# Patient Record
Sex: Male | Born: 1935 | Race: White | Hispanic: No | Marital: Married | Smoking: Never smoker
Health system: Southern US, Community
[De-identification: ages and names within clinical notes are randomized; demographics above are authoritative.]

## PROBLEM LIST (undated history)

## (undated) DIAGNOSIS — N401 Enlarged prostate with lower urinary tract symptoms: Secondary | ICD-10-CM

## (undated) DIAGNOSIS — R739 Hyperglycemia, unspecified: Secondary | ICD-10-CM

## (undated) DIAGNOSIS — M109 Gout, unspecified: Secondary | ICD-10-CM

## (undated) DIAGNOSIS — N400 Enlarged prostate without lower urinary tract symptoms: Secondary | ICD-10-CM

## (undated) DIAGNOSIS — T8579XA Infection and inflammatory reaction due to other internal prosthetic devices, implants and grafts, initial encounter: Secondary | ICD-10-CM

## (undated) DIAGNOSIS — I10 Essential (primary) hypertension: Secondary | ICD-10-CM

## (undated) DIAGNOSIS — N289 Disorder of kidney and ureter, unspecified: Secondary | ICD-10-CM

## (undated) DIAGNOSIS — R351 Nocturia: Secondary | ICD-10-CM

## (undated) DIAGNOSIS — J45909 Unspecified asthma, uncomplicated: Secondary | ICD-10-CM

## (undated) DIAGNOSIS — I4891 Unspecified atrial fibrillation: Secondary | ICD-10-CM

## (undated) DIAGNOSIS — I5022 Chronic systolic (congestive) heart failure: Secondary | ICD-10-CM

## (undated) DIAGNOSIS — J449 Chronic obstructive pulmonary disease, unspecified: Secondary | ICD-10-CM

## (undated) DIAGNOSIS — A4901 Methicillin susceptible Staphylococcus aureus infection, unspecified site: Secondary | ICD-10-CM

## (undated) DIAGNOSIS — N139 Obstructive and reflux uropathy, unspecified: Secondary | ICD-10-CM

## (undated) DIAGNOSIS — E782 Mixed hyperlipidemia: Secondary | ICD-10-CM

## (undated) DIAGNOSIS — L570 Actinic keratosis: Secondary | ICD-10-CM

## (undated) DIAGNOSIS — R600 Localized edema: Secondary | ICD-10-CM

## (undated) DIAGNOSIS — M1990 Unspecified osteoarthritis, unspecified site: Secondary | ICD-10-CM

## (undated) DIAGNOSIS — N138 Other obstructive and reflux uropathy: Secondary | ICD-10-CM

## (undated) DIAGNOSIS — I480 Paroxysmal atrial fibrillation: Secondary | ICD-10-CM

## (undated) DIAGNOSIS — I340 Nonrheumatic mitral (valve) insufficiency: Secondary | ICD-10-CM

## (undated) HISTORY — DX: Chronic obstructive pulmonary disease, unspecified: J44.9

## (undated) HISTORY — DX: Localized edema: R60.0

## (undated) HISTORY — DX: Obstructive and reflux uropathy, unspecified: N13.9

## (undated) HISTORY — DX: Benign prostatic hyperplasia with lower urinary tract symptoms: N40.1

## (undated) HISTORY — DX: Benign prostatic hyperplasia without lower urinary tract symptoms: N40.0

## (undated) HISTORY — DX: Chronic systolic (congestive) heart failure: I50.22

## (undated) HISTORY — DX: Paroxysmal atrial fibrillation: I48.0

## (undated) HISTORY — DX: Essential (primary) hypertension: I10

## (undated) HISTORY — DX: Hyperglycemia, unspecified: R73.9

## (undated) HISTORY — DX: Nonrheumatic mitral (valve) insufficiency: I34.0

## (undated) HISTORY — DX: Infection and inflammatory reaction due to other internal prosthetic devices, implants and grafts, initial encounter: T85.79XA

## (undated) HISTORY — DX: Unspecified osteoarthritis, unspecified site: M19.90

## (undated) HISTORY — DX: Disorder of kidney and ureter, unspecified: N28.9

## (undated) HISTORY — DX: Mixed hyperlipidemia: E78.2

## (undated) HISTORY — DX: Unspecified asthma, uncomplicated: J45.909

## (undated) HISTORY — DX: Other obstructive and reflux uropathy: N13.8

## (undated) HISTORY — DX: Nocturia: R35.1

## (undated) HISTORY — DX: Gout, unspecified: M10.9

## (undated) HISTORY — DX: Actinic keratosis: L57.0

## (undated) HISTORY — DX: Methicillin susceptible Staphylococcus aureus infection, unspecified site: A49.01

---
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
## 1964-09-27 HISTORY — PX: TONSILLECTOMY: SUR1361

## 1983-09-28 HISTORY — PX: TRANSURETHRAL RESECTION OF PROSTATE: SHX73

## 1991-09-28 HISTORY — PX: HERNIA REPAIR: SHX51

## 2004-08-13 ENCOUNTER — Ambulatory Visit: Payer: Self-pay | Admitting: Gastroenterology

## 2004-12-16 ENCOUNTER — Ambulatory Visit: Payer: Self-pay | Admitting: Unknown Physician Specialty

## 2004-12-18 ENCOUNTER — Inpatient Hospital Stay: Payer: Self-pay | Admitting: Internal Medicine

## 2005-03-23 ENCOUNTER — Encounter: Payer: Self-pay | Admitting: Internal Medicine

## 2005-09-27 HISTORY — PX: TOTAL KNEE ARTHROPLASTY: SHX125

## 2006-02-24 IMAGING — US US EXTREM LOW VENOUS*L*
1 series · 17 of 24 positions shown · non-contrast
Comparison: none

REASON FOR EXAM: Swelling and redness, evaluate for DVT in left leg
Call Report [phone], Ext #[phone]
COMMENTS:

[Series 1: us extrem low venous*left* · 17 of 25 slices shown]
[im 1/25]
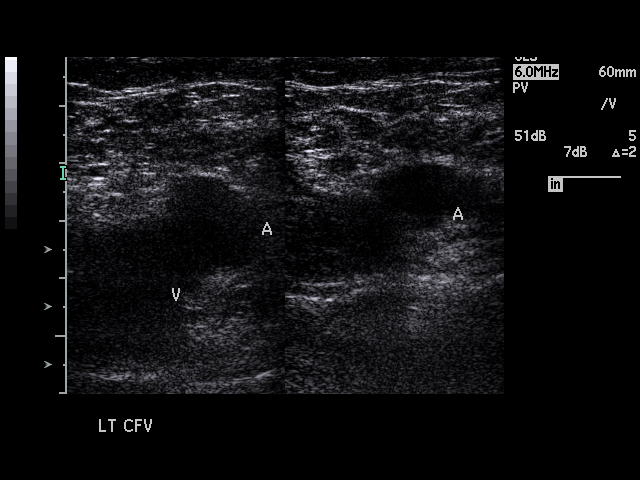
[im 3/25]
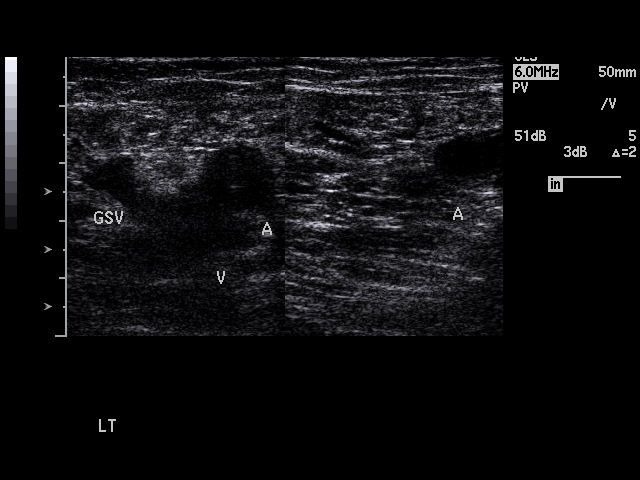
[im 4/25]
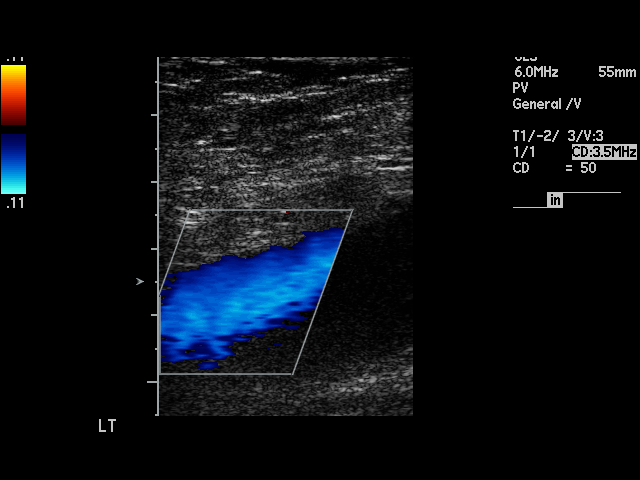
[im 5/25]
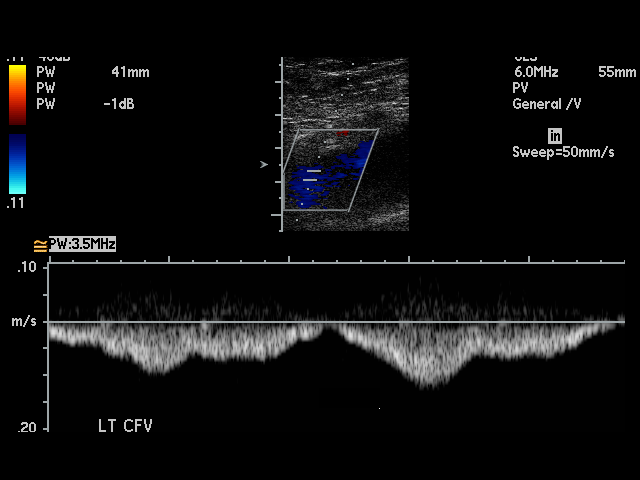
[im 7/25]
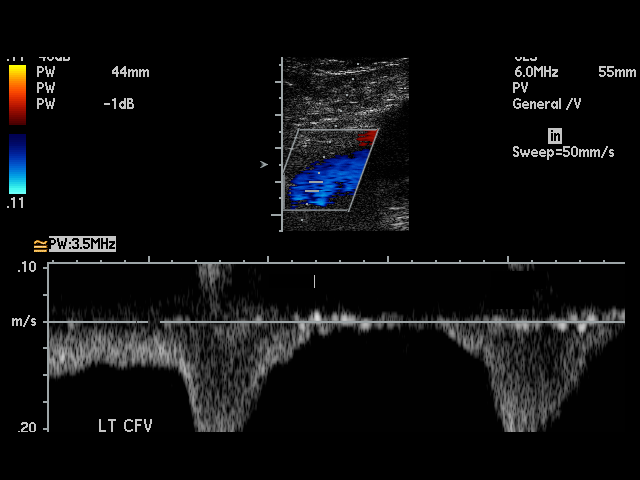
[im 8/25]
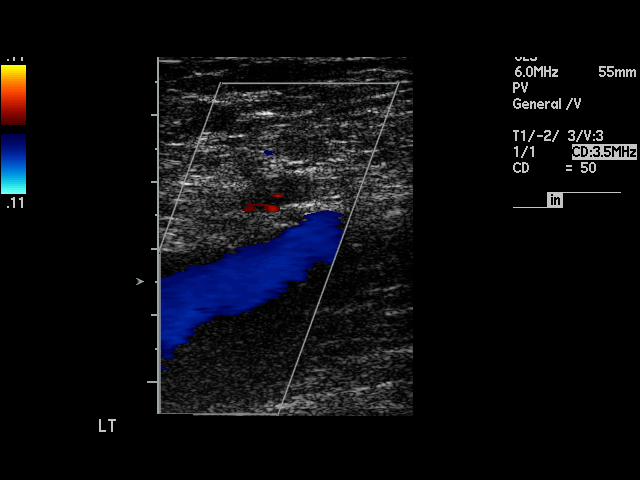
[im 10/25]
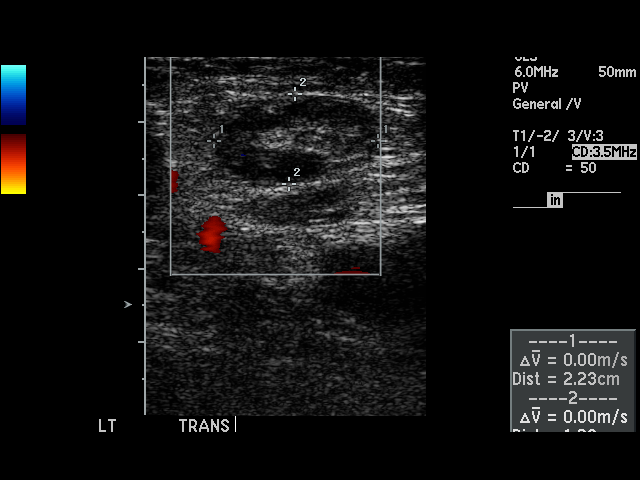
[im 11/25]
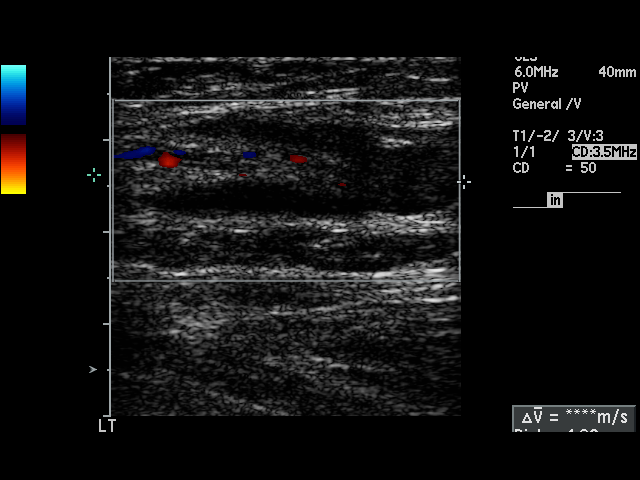
[im 13/25]
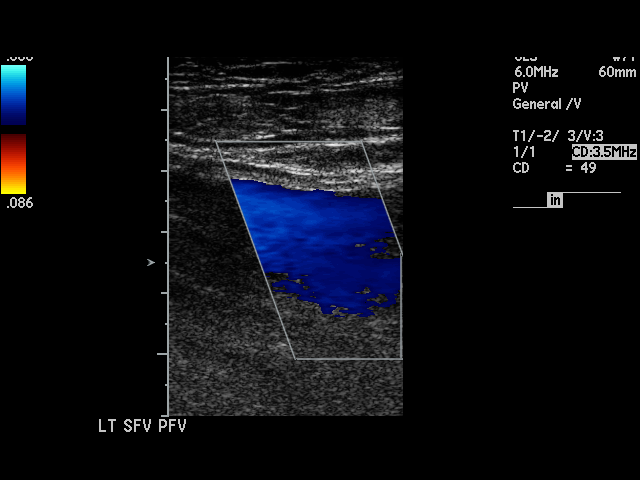
[im 14/25]
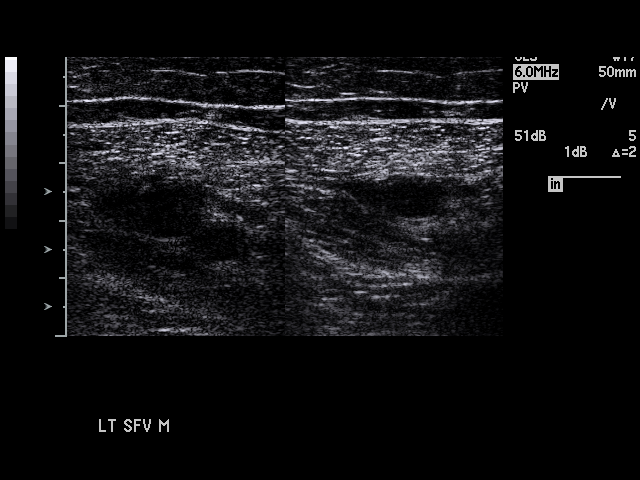
[im 15/25]
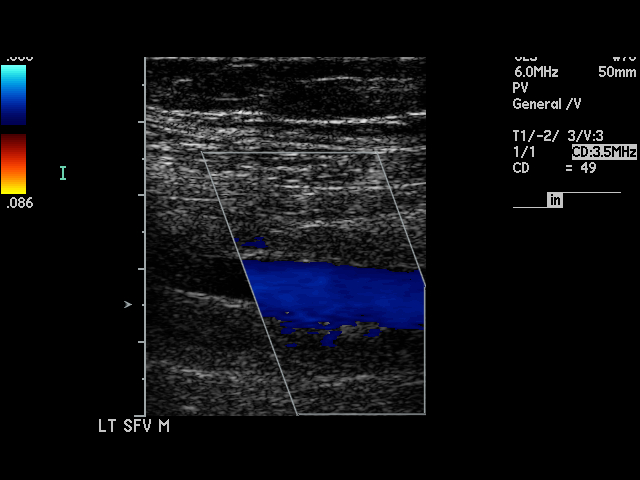
[im 17/25]
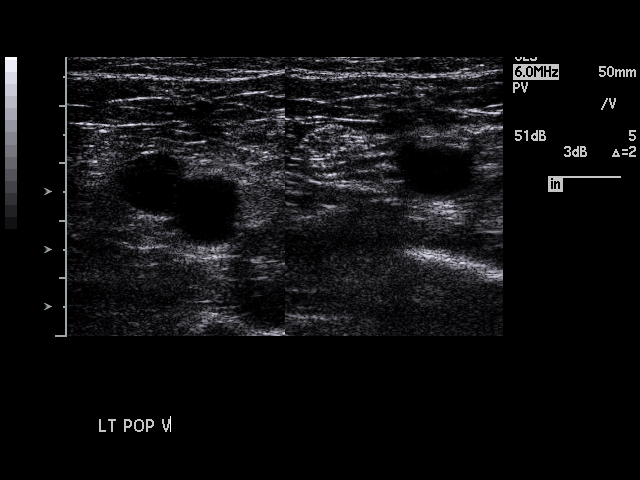
[im 18/25]
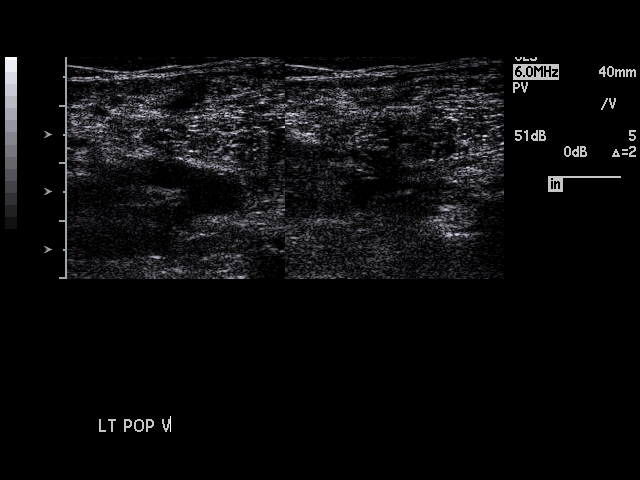
[im 20/25]
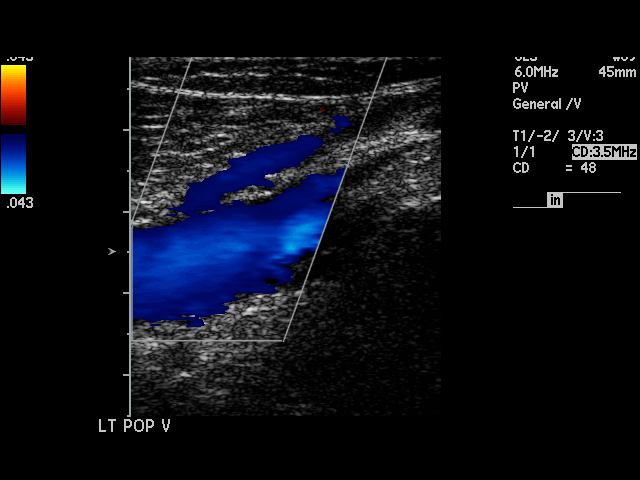
[im 21/25]
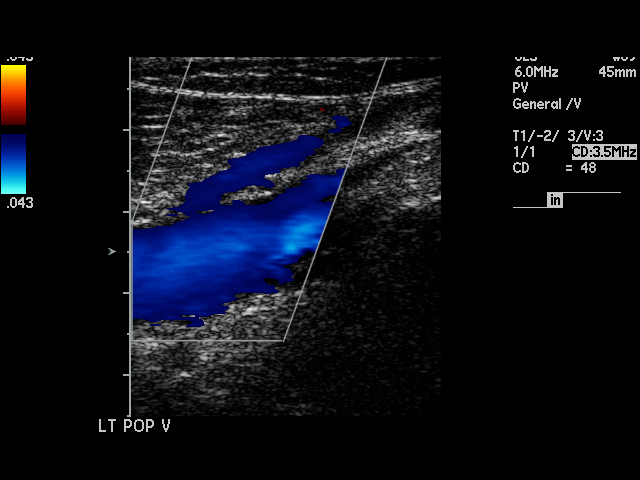
[im 22/25]
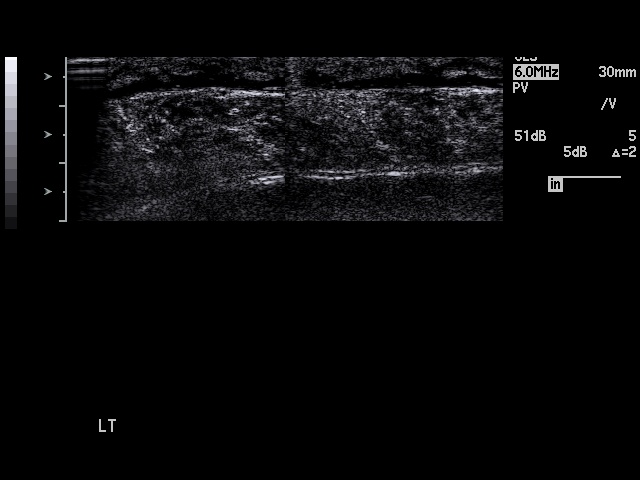
[im 25/25]
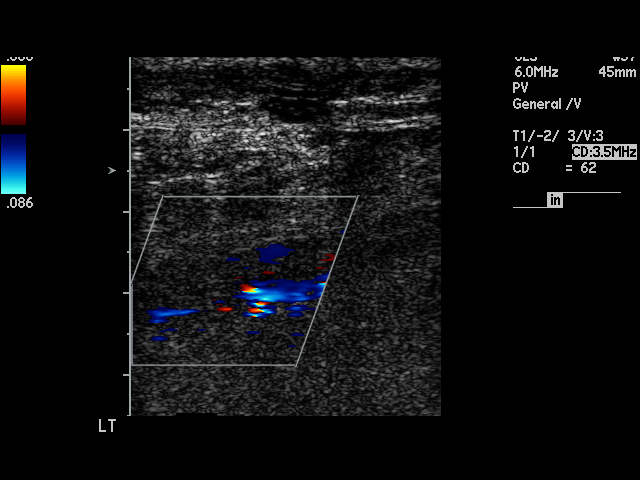

[17 of 24 positions shown; findings below may reference images not displayed]

PROCEDURE:     US  - US DOPPLER LOW EXTR LEFT  - December 16, 2004  [DATE]

RESULT:     LEFT lower extremity deep venous ultrasound and color Doppler
examination is performed.

The phasic, augmentation and Valsalva flow waveforms are normal. The LEFT
femoral and popliteal vein shows normal compressibility. Doppler examination
shows no occlusion.
IMPRESSION: Normal study. No deep venous thrombosis is identified.

## 2006-02-26 IMAGING — US US EXTREM LOW VENOUS*L*
1 series · 17 of 24 positions shown · non-contrast
Comparison: none

REASON FOR EXAM: Pain, cellulitis       please look all the way down to the
calf
COMMENTS:

[Series 1: us extrem low venous*left* · 17 of 34 slices shown]
[im 1/34]
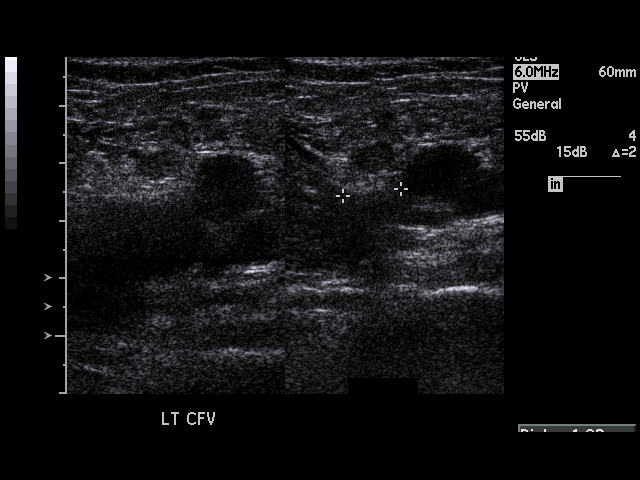
[im 3/34]
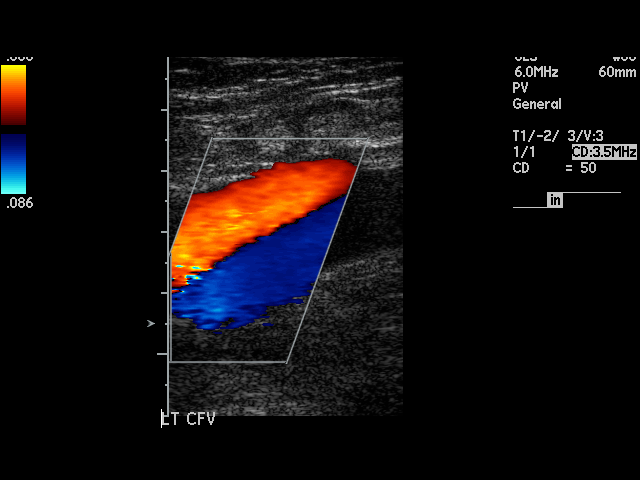
[im 5/34]
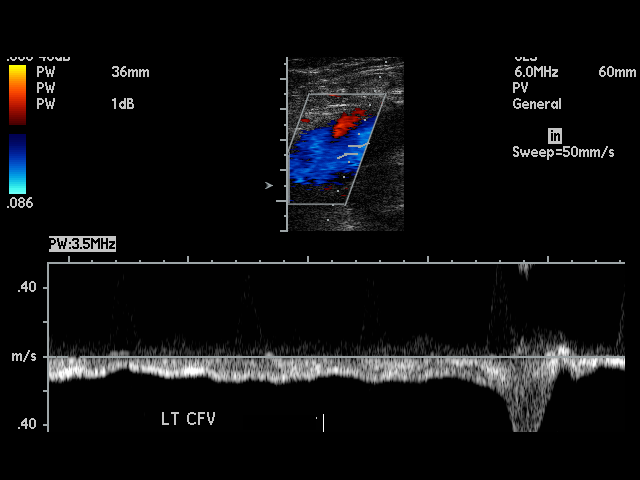
[im 6/34]
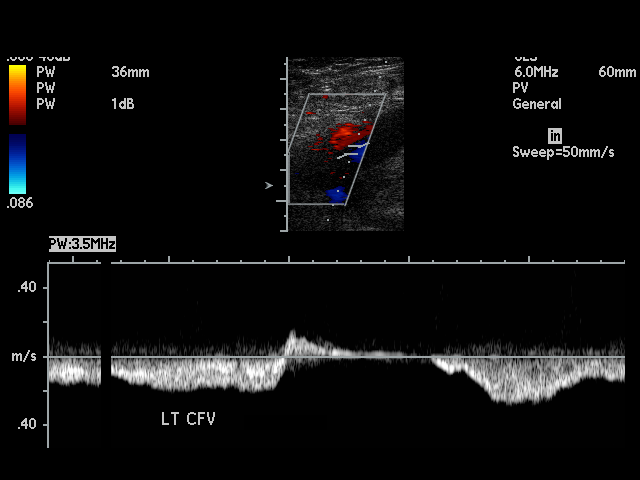
[im 9/34]
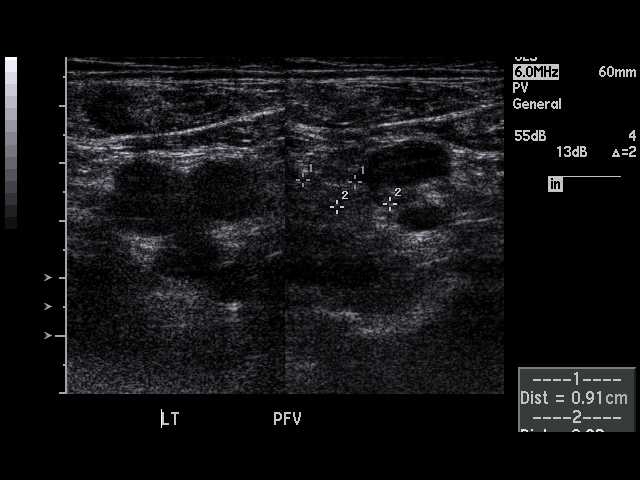
[im 11/34]
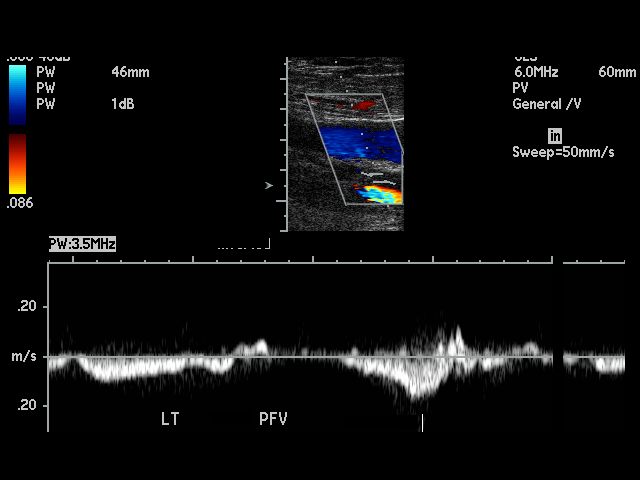
[im 13/34]
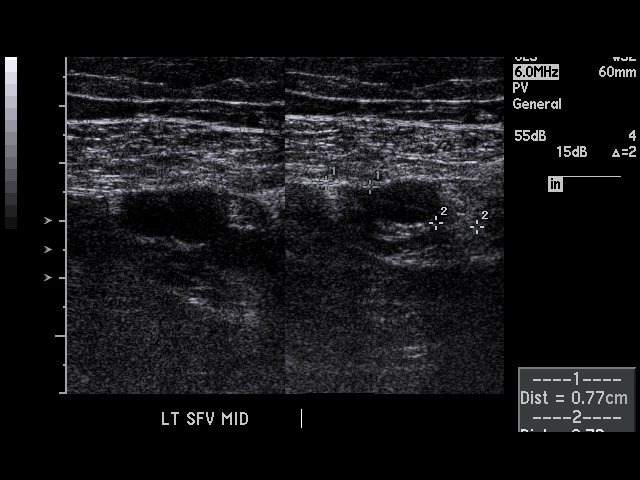
[im 15/34]
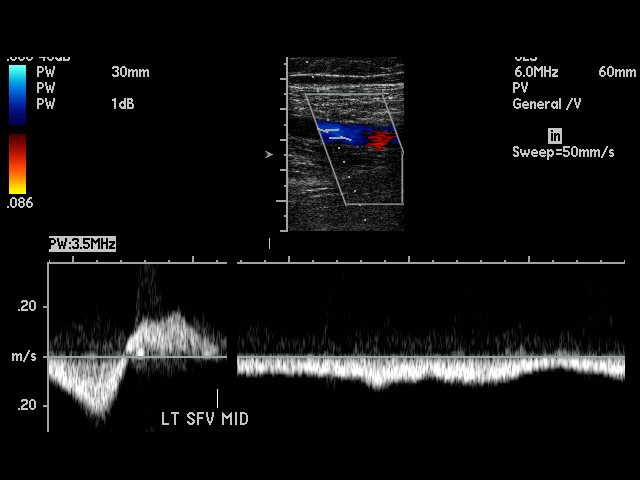
[im 18/34]
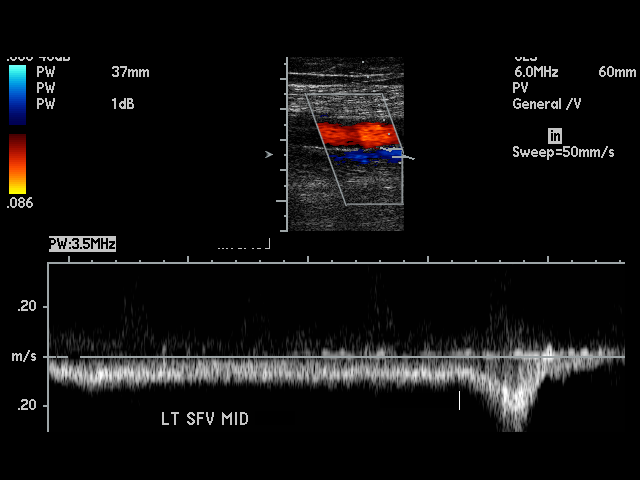
[im 19/34]
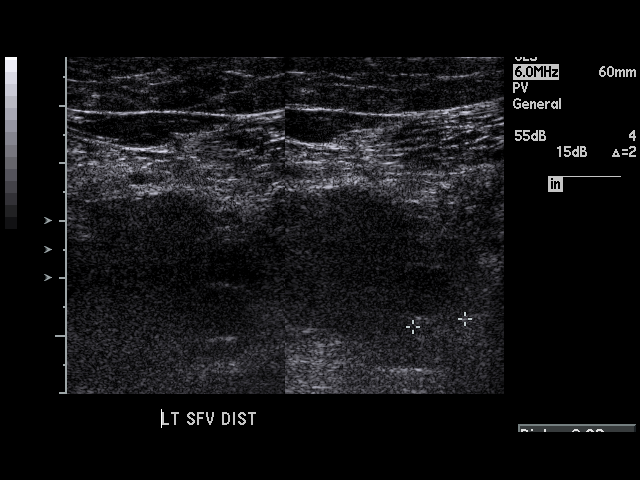
[im 21/34]
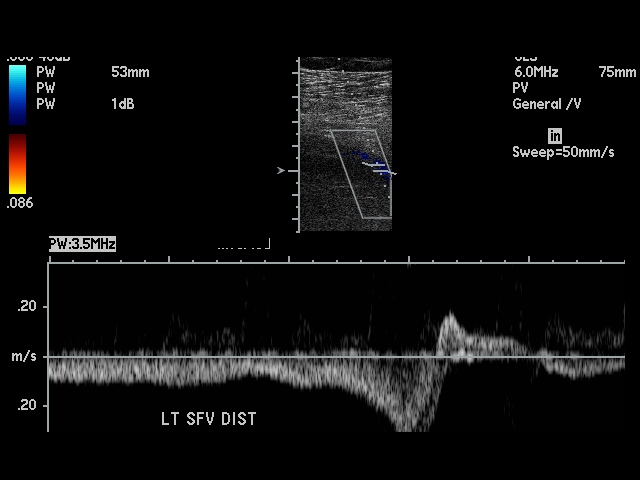
[im 23/34]
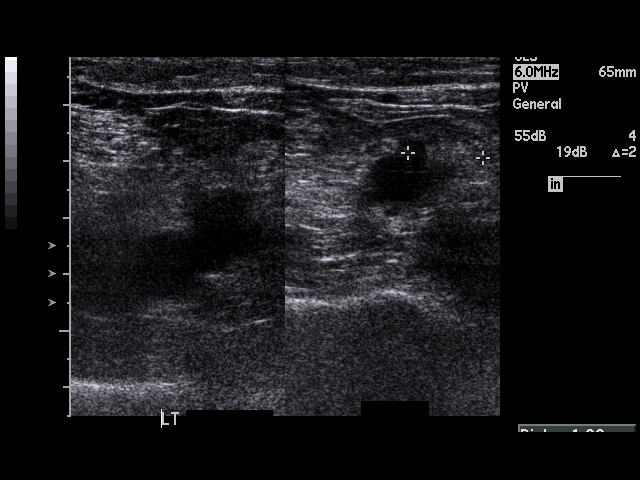
[im 25/34]
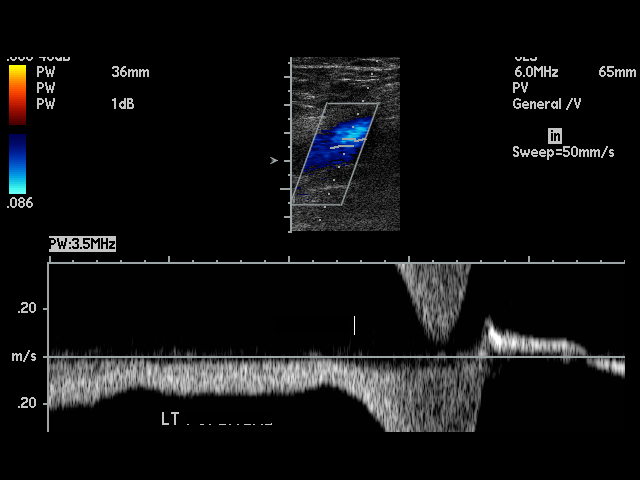
[im 28/34]
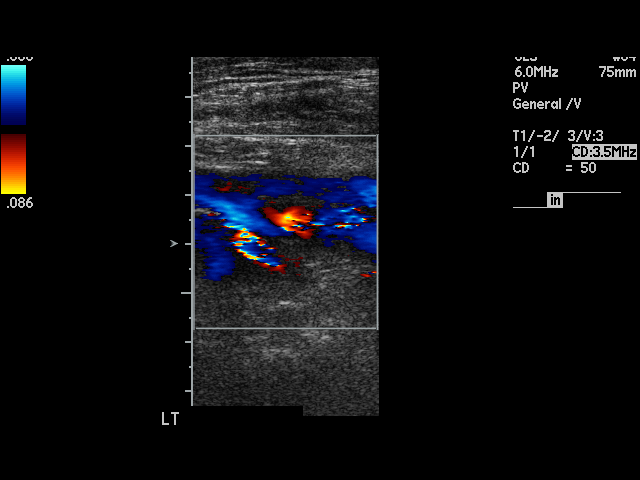
[im 29/34]
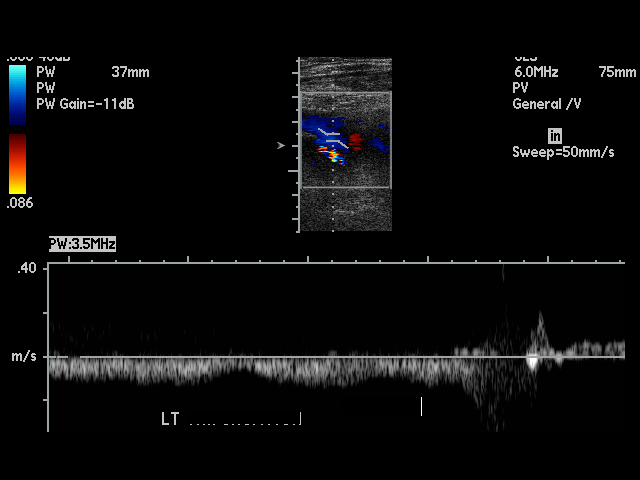
[im 31/34]
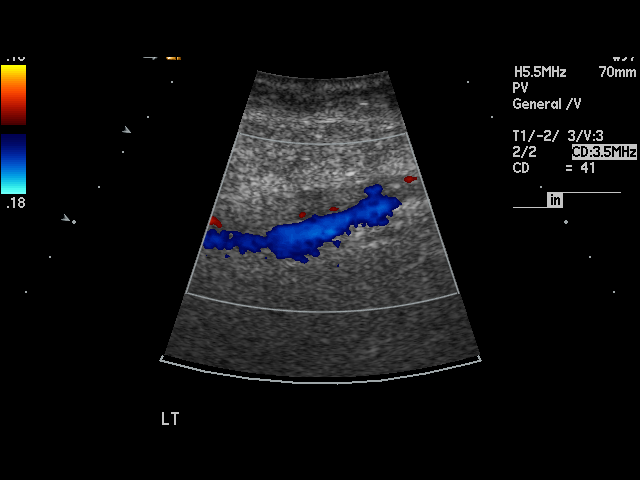
[im 34/34]
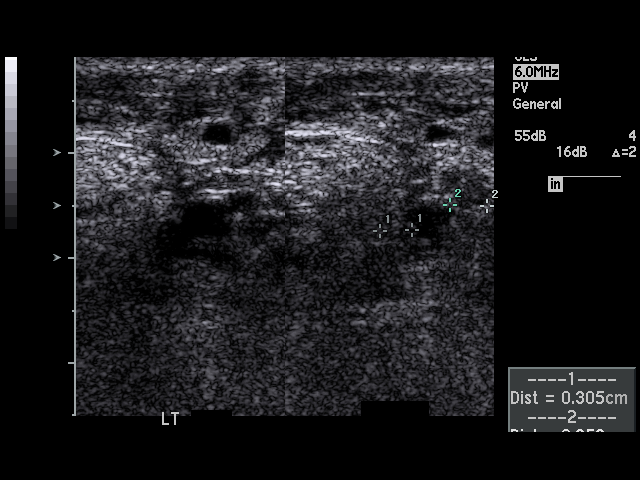

[17 of 24 positions shown; findings below may reference images not displayed]

PROCEDURE:     US  - US DOPPLER LOW EXTR LEFT  - December 18, 2004  [DATE]

RESULT:     The LEFT femoral and popliteal veins are normally compressible.
The waveform patterns are normal and the color flow images are normal.
Imaging was performed to the level of the trifurcation of the deep veins in
the upper calf and no thrombus was seen. The patient has an obviously
swollen and inflamed calf.
IMPRESSION: There is no evidence of thrombus within the LEFT femoral or
popliteal veins or within the visualized portions of the calf vessels.

The findings were called to the Emergency Room at the conclusion of the
study.

## 2006-03-03 IMAGING — NM NM BONE LIMITED
1 series · 10 of 10 positions shown · non-contrast
Comparison: none

REASON FOR EXAM: part 2
COMMENTS:

[Series 1: bone statics · 2.40mm/px · 5 acquisitions, 10 frames shown]
[im 1/5]
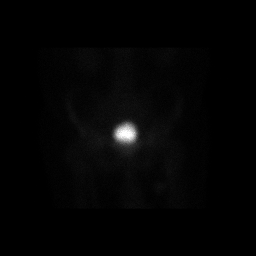
[im 1/5]
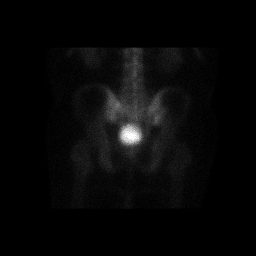
[im 2/5]
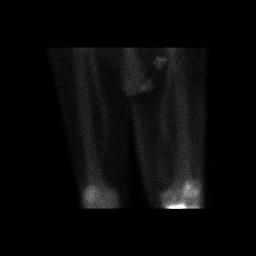
[im 2/5]
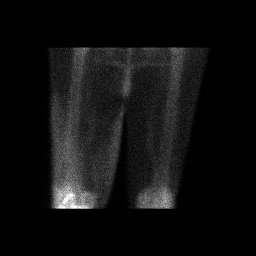
[im 3/5]
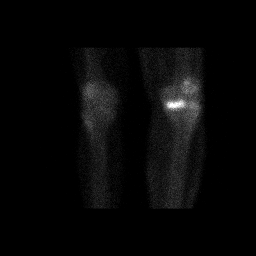
[im 3/5]
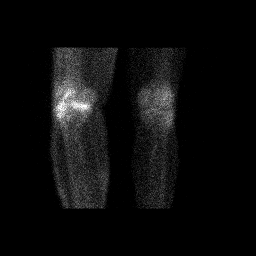
[im 4/5]
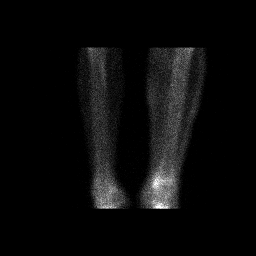
[im 4/5]
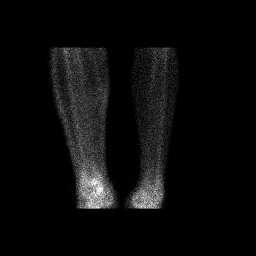
[im 5/5]
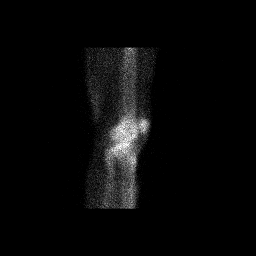
[im 5/5]
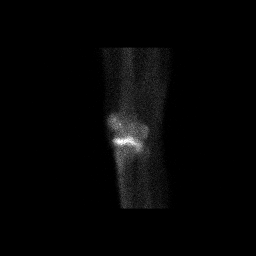

[10 of 10 positions shown; findings below may reference images not displayed]

PROCEDURE:     NM  - NM LIMITED BONE SCAN 3HR [DATE]  [DATE]

RESULT:     The patient is having swelling and pain in the LEFT lower leg.
The patient reports a total knee replacement 25 years ago, but accompanying
plain films of the LEFT knee do not reveal evidence of a prosthesis
currently.  The patient received 20.87 mCi of technetium-77m labeled HDP.
Flow, blood pool, and delayed imaging were performed. There is moderately
increased uptake diffusely over the LEFT lower extremity.  There is a tiny
focus of increased uptake in a linear fashion, which appears to correspond
to the medial joint compartment on both the flow and blood pool images.  On
the delayed images this area is quite intensely increased in terms of uptake
and corresponds to the medial joint compartment. Mildly increased uptake on
the delayed images is noted in the lateral compartment and in the patella.
Activity over the shafts of the tibia and fibula appears normal. Minimally
increased uptake on the delayed images in the medial malleolar region is
likely degenerative.
IMPRESSION: 1)The medial joint compartment of the LEFT knee demonstrates increased
uptake on the flow, blood pool and delayed images.  Thus, osteomyelitis
cannot be excluded.  The lateral joint compartment as well as the patella,
both of which reveal mildly increased uptake on the delayed images, do not
reveal increased uptake on the flow or blood pool images and the activity
seen here is likely related to degenerative change. No abnormal activity is
seen over the shafts of the tibia or fibula on the LEFT and activity over
the LEFT medial malleolus is likely degenerative in nature.

This study received intradepartmental review.

## 2006-12-05 ENCOUNTER — Inpatient Hospital Stay: Payer: Self-pay | Admitting: Internal Medicine

## 2007-05-13 DIAGNOSIS — T8579XA Infection and inflammatory reaction due to other internal prosthetic devices, implants and grafts, initial encounter: Secondary | ICD-10-CM | POA: Insufficient documentation

## 2007-05-13 HISTORY — DX: Infection and inflammatory reaction due to other internal prosthetic devices, implants and grafts, initial encounter: T85.79XA

## 2009-09-12 ENCOUNTER — Emergency Department: Payer: Self-pay | Admitting: Emergency Medicine

## 2010-02-25 DIAGNOSIS — A4901 Methicillin susceptible Staphylococcus aureus infection, unspecified site: Secondary | ICD-10-CM

## 2010-02-25 HISTORY — DX: Methicillin susceptible Staphylococcus aureus infection, unspecified site: A49.01

## 2012-06-30 ENCOUNTER — Ambulatory Visit: Payer: Self-pay | Admitting: Gastroenterology

## 2012-07-11 DIAGNOSIS — M19049 Primary osteoarthritis, unspecified hand: Secondary | ICD-10-CM | POA: Insufficient documentation

## 2012-10-30 DIAGNOSIS — N138 Other obstructive and reflux uropathy: Secondary | ICD-10-CM

## 2012-10-30 DIAGNOSIS — R350 Frequency of micturition: Secondary | ICD-10-CM | POA: Insufficient documentation

## 2012-10-30 DIAGNOSIS — R351 Nocturia: Secondary | ICD-10-CM

## 2012-10-30 HISTORY — DX: Other obstructive and reflux uropathy: N13.8

## 2012-10-30 HISTORY — DX: Nocturia: R35.1

## 2013-12-05 ENCOUNTER — Emergency Department: Payer: Self-pay | Admitting: Emergency Medicine

## 2013-12-05 LAB — COMPREHENSIVE METABOLIC PANEL
ALBUMIN: 3.5 g/dL (ref 3.4–5.0)
ALK PHOS: 42 U/L — AB
Anion Gap: 7 (ref 7–16)
BUN: 35 mg/dL — ABNORMAL HIGH (ref 7–18)
Bilirubin,Total: 0.9 mg/dL (ref 0.2–1.0)
CALCIUM: 8.6 mg/dL (ref 8.5–10.1)
CHLORIDE: 107 mmol/L (ref 98–107)
CO2: 23 mmol/L (ref 21–32)
Creatinine: 1.69 mg/dL — ABNORMAL HIGH (ref 0.60–1.30)
EGFR (African American): 43 — ABNORMAL LOW
EGFR (Non-African Amer.): 37 — ABNORMAL LOW
Glucose: 115 mg/dL — ABNORMAL HIGH (ref 65–99)
Osmolality: 283 (ref 275–301)
POTASSIUM: 4 mmol/L (ref 3.5–5.1)
SGOT(AST): 52 U/L — ABNORMAL HIGH (ref 15–37)
SGPT (ALT): 33 U/L (ref 12–78)
Sodium: 137 mmol/L (ref 136–145)
Total Protein: 7.7 g/dL (ref 6.4–8.2)

## 2013-12-05 LAB — CBC WITH DIFFERENTIAL/PLATELET
BASOS PCT: 0.4 %
Basophil #: 0 10*3/uL (ref 0.0–0.1)
EOS PCT: 0.5 %
Eosinophil #: 0 10*3/uL (ref 0.0–0.7)
HCT: 48.1 % (ref 40.0–52.0)
HGB: 16.7 g/dL (ref 13.0–18.0)
LYMPHS ABS: 1.1 10*3/uL (ref 1.0–3.6)
LYMPHS PCT: 28 %
MCH: 34.1 pg — ABNORMAL HIGH (ref 26.0–34.0)
MCHC: 34.8 g/dL (ref 32.0–36.0)
MCV: 98 fL (ref 80–100)
MONO ABS: 0.7 x10 3/mm (ref 0.2–1.0)
MONOS PCT: 17.1 %
NEUTROS PCT: 54 %
Neutrophil #: 2.2 10*3/uL (ref 1.4–6.5)
PLATELETS: 121 10*3/uL — AB (ref 150–440)
RBC: 4.91 10*6/uL (ref 4.40–5.90)
RDW: 13.9 % (ref 11.5–14.5)
WBC: 4 10*3/uL (ref 3.8–10.6)

## 2013-12-05 LAB — URINALYSIS, COMPLETE
BILIRUBIN, UR: NEGATIVE
Bacteria: NONE SEEN
Glucose,UR: NEGATIVE mg/dL (ref 0–75)
Ketone: NEGATIVE
LEUKOCYTE ESTERASE: NEGATIVE
NITRITE: NEGATIVE
Ph: 5 (ref 4.5–8.0)
Protein: 30
Specific Gravity: 1.023 (ref 1.003–1.030)

## 2013-12-05 LAB — LIPASE, BLOOD: Lipase: 46 U/L — ABNORMAL LOW (ref 73–393)

## 2014-04-01 DIAGNOSIS — E785 Hyperlipidemia, unspecified: Secondary | ICD-10-CM | POA: Insufficient documentation

## 2014-04-01 DIAGNOSIS — E782 Mixed hyperlipidemia: Secondary | ICD-10-CM

## 2014-04-01 DIAGNOSIS — R0602 Shortness of breath: Secondary | ICD-10-CM | POA: Insufficient documentation

## 2014-04-01 DIAGNOSIS — R600 Localized edema: Secondary | ICD-10-CM

## 2014-04-01 HISTORY — DX: Mixed hyperlipidemia: E78.2

## 2014-04-01 HISTORY — DX: Localized edema: R60.0

## 2014-07-09 DIAGNOSIS — I5032 Chronic diastolic (congestive) heart failure: Secondary | ICD-10-CM | POA: Insufficient documentation

## 2014-07-09 DIAGNOSIS — I480 Paroxysmal atrial fibrillation: Secondary | ICD-10-CM

## 2014-07-09 DIAGNOSIS — I340 Nonrheumatic mitral (valve) insufficiency: Secondary | ICD-10-CM | POA: Insufficient documentation

## 2014-07-09 DIAGNOSIS — I5022 Chronic systolic (congestive) heart failure: Secondary | ICD-10-CM

## 2014-07-09 HISTORY — DX: Paroxysmal atrial fibrillation: I48.0

## 2014-07-09 HISTORY — DX: Nonrheumatic mitral (valve) insufficiency: I34.0

## 2014-07-09 HISTORY — DX: Chronic systolic (congestive) heart failure: I50.22

## 2014-11-28 DIAGNOSIS — I10 Essential (primary) hypertension: Secondary | ICD-10-CM | POA: Insufficient documentation

## 2014-11-28 DIAGNOSIS — R739 Hyperglycemia, unspecified: Secondary | ICD-10-CM

## 2014-11-28 HISTORY — DX: Hyperglycemia, unspecified: R73.9

## 2014-12-13 DIAGNOSIS — N139 Obstructive and reflux uropathy, unspecified: Secondary | ICD-10-CM

## 2014-12-13 DIAGNOSIS — J45909 Unspecified asthma, uncomplicated: Secondary | ICD-10-CM

## 2014-12-13 DIAGNOSIS — N289 Disorder of kidney and ureter, unspecified: Secondary | ICD-10-CM

## 2014-12-13 DIAGNOSIS — N400 Enlarged prostate without lower urinary tract symptoms: Secondary | ICD-10-CM

## 2014-12-13 DIAGNOSIS — M109 Gout, unspecified: Secondary | ICD-10-CM

## 2014-12-13 DIAGNOSIS — I878 Other specified disorders of veins: Secondary | ICD-10-CM | POA: Insufficient documentation

## 2014-12-13 DIAGNOSIS — I10 Essential (primary) hypertension: Secondary | ICD-10-CM

## 2014-12-13 DIAGNOSIS — J449 Chronic obstructive pulmonary disease, unspecified: Secondary | ICD-10-CM

## 2014-12-13 HISTORY — DX: Gout, unspecified: M10.9

## 2014-12-13 HISTORY — DX: Unspecified asthma, uncomplicated: J45.909

## 2014-12-13 HISTORY — DX: Chronic obstructive pulmonary disease, unspecified: J44.9

## 2014-12-13 HISTORY — DX: Essential (primary) hypertension: I10

## 2014-12-13 HISTORY — DX: Benign prostatic hyperplasia without lower urinary tract symptoms: N40.0

## 2014-12-13 HISTORY — DX: Obstructive and reflux uropathy, unspecified: N13.9

## 2014-12-13 HISTORY — DX: Disorder of kidney and ureter, unspecified: N28.9

## 2015-05-22 DIAGNOSIS — I10 Essential (primary) hypertension: Secondary | ICD-10-CM | POA: Diagnosis not present

## 2015-05-22 DIAGNOSIS — R040 Epistaxis: Secondary | ICD-10-CM | POA: Diagnosis not present

## 2015-05-22 DIAGNOSIS — Z791 Long term (current) use of non-steroidal anti-inflammatories (NSAID): Secondary | ICD-10-CM | POA: Diagnosis not present

## 2015-05-23 ENCOUNTER — Emergency Department
Admission: EM | Admit: 2015-05-23 | Discharge: 2015-05-23 | Disposition: A | Discharge status: Home / Self Care | Payer: Medicare Other | Attending: Emergency Medicine | Admitting: Emergency Medicine

## 2015-05-23 ENCOUNTER — Encounter: Payer: Self-pay | Admitting: *Deleted

## 2015-05-23 DIAGNOSIS — R040 Epistaxis: Secondary | ICD-10-CM

## 2015-05-23 HISTORY — DX: Essential (primary) hypertension: I10

## 2015-05-23 HISTORY — DX: Unspecified atrial fibrillation: I48.91

## 2015-05-23 NOTE — Discharge Instructions (Signed)
If your nose rebleeds during the night, try the following:  Mix lidocaine and Afrin 1:1 in small container (usually 2-3 sprays of Afrin will suffice), Soak cotton ball in mixture and shape it to fit your nostril, Insert cotton ball and apply nose clamp, Apply ice to bridge of nose.  If nose continues to bleed after 20 minutes, or you experience heavy bleeding or feeling lightheaded, return to the ED immediately.  Return to the ER for recurrent or worsening symptoms, persistent vomiting, difficulty breathing or other concerns.   Nosebleed Nosebleeds can be caused by many conditions, including trauma, infections, polyps, foreign bodies, dry mucous membranes or climate, medicines, and air conditioning. Most nosebleeds occur in the front of the nose. Because of this location, most nosebleeds can be controlled by pinching the nostrils gently and continuously for at least 10 to 20 minutes. The long, continuous pressure allows enough time for the blood to clot. If pressure is released during that 10 to 20 minute time period, the process may have to be started again. The nosebleed may stop by itself or quit with pressure, or it may need concentrated heating (cautery) or pressure from packing. HOME CARE INSTRUCTIONS   If your nose was packed, try to maintain the pack inside until your health care provider removes it. If a gauze pack was used and it starts to fall out, gently replace it or cut the end off. Do not cut if a balloon catheter was used to pack the nose. Otherwise, do not remove unless instructed.  Avoid blowing your nose for 12 hours after treatment. This could dislodge the pack or clot and start the bleeding again.  If the bleeding starts again, sit up and bend forward, gently pinching the front half of your nose continuously for 20 minutes.  If bleeding was caused by dry mucous membranes, use over-the-counter saline nasal spray or gel. This will keep the mucous membranes moist and allow  them to heal. If you must use a lubricant, choose the water-soluble variety. Use it only sparingly and not within several hours of lying down.  Do not use petroleum jelly or mineral oil, as these may drip into the lungs and cause serious problems.  Maintain humidity in your home by using less air conditioning or by using a humidifier.  Do not use aspirin or medicines which make bleeding more likely. Your health care provider can give you recommendations on this.  Resume normal activities as you are able, but try to avoid straining, lifting, or bending at the waist for several days.  If the nosebleeds become recurrent and the cause is unknown, your health care provider may suggest laboratory tests. SEEK MEDICAL CARE IF: You have a fever. SEEK IMMEDIATE MEDICAL CARE IF:   Bleeding recurs and cannot be controlled.  There is unusual bleeding from or bruising on other parts of the body.  Nosebleeds continue.  There is any worsening of the condition which originally brought you in.  You become light-headed, feel faint, become sweaty, or vomit blood. MAKE SURE YOU:   Understand these instructions.  Will watch your condition.  Will get help right away if you are not doing well or get worse. Document Released: 06/23/2005 Document Revised: 01/28/2014 Document Reviewed: 08/14/2009 [facility] Patient Information 2015 [place], [place]. This information is not intended to replace advice given to you by your health care provider. Make sure you discuss any questions you have with your health care provider.

## 2015-05-23 NOTE — ED Provider Notes (Signed)
[facility] Emergency Department Provider Note  ____________________________________________  Time seen: Approximately 2:09 AM  I have reviewed the triage vital signs and the nursing notes.   HISTORY  Chief Complaint Epistaxis    HPI David Frey is a 81 y.o. male who presents to the ED from home for a chief complaint of nosebleed. Patient takes Eliquis for atrial fibrillation. Reports having left-sided nosebleeds on and off today. First nosebleed began at 7 AM which patient was able to control within 20 minutes with pressure and ice. 12 hours later patient reports left-sided nose bleed again and was unable to control the bleeding.Spouse spoke with patient's cardiologist Dr. Nehemiah Massed who told him to pack his nose and he would see the patient in the office this morning. Patient denies recent trauma, fever, chills, chest pain, shortness of breath, dizziness, overly blowing nose, sinus congestion or drainage.   Past Medical History  Diagnosis Date  . Hypertension   . Atrial fibrillation     There are no active problems to display for this patient.   History reviewed. No pertinent past surgical history.  Current Outpatient Rx  Name  Route  Sig  Dispense  Refill  . naproxen (NAPROSYN) 250 MG tablet   Oral   Take by mouth 2 (two) times daily with a meal.           Allergies Review of patient's allergies indicates not on file.  History reviewed. No pertinent family history.  Social History Social History  Substance Use Topics  . Smoking status: Never Smoker   . Smokeless tobacco: None  . Alcohol Use: No    Review of Systems Constitutional: No fever/chills Eyes: No visual changes. ENT: Positive for nosebleed. No sore throat. Cardiovascular: Denies chest pain. Respiratory: Denies shortness of breath. Gastrointestinal: No abdominal pain.  No nausea, no vomiting.  No diarrhea.  No constipation. Genitourinary: Negative for  dysuria. Musculoskeletal: Negative for back pain. Skin: Negative for rash. Neurological: Negative for headaches, focal weakness or numbness.  10-point ROS otherwise negative.  ____________________________________________   PHYSICAL EXAM:  VITAL SIGNS: ED Triage Vitals  Enc Vitals Group     BP 05/23/15 0011 152/92 mmHg     Pulse Rate 05/23/15 0011 70     Resp 05/23/15 0011 16     Temp 05/23/15 0011 98.7 F (37.1 C)     Temp Source 05/23/15 0011 Oral     SpO2 05/23/15 0011 92 %     Weight 05/23/15 0011 194 lb (87.998 kg)     Height 05/23/15 0011 _0  (1.753 m)     Head Cir --      Peak Flow --      Pain Score 05/23/15 0207 0     Pain Loc --      Pain Edu? --      Excl. in [place]? --     Constitutional: Alert and oriented. Well appearing and in no acute distress. Eyes: Conjunctivae are normal. PERRL. EOMI. Head: Atraumatic. Nose: No active bleeding. Small area of anterior irritation left nares. Mouth/Throat: Mucous membranes are moist.  Oropharynx non-erythematous. Neck: No stridor. No carotid bruits. Cardiovascular: Normal rate, irregular rhythm. Grossly normal heart sounds.  Good peripheral circulation. Respiratory: Normal respiratory effort.  No retractions. Lungs CTAB. Gastrointestinal: Soft and nontender. No distention. No abdominal bruits. No CVA tenderness. Musculoskeletal: No lower extremity tenderness nor edema.  No joint effusions. Neurologic:  Normal speech and language. No gross focal neurologic deficits are appreciated. No gait  instability. Skin:  Skin is warm, dry and intact. No rash noted. Psychiatric: Mood and affect are normal. Speech and behavior are normal.  ____________________________________________   LABS (all labs ordered are listed, but only abnormal results are displayed)  Labs Reviewed - No data to  display ____________________________________________  EKG  None ____________________________________________  RADIOLOGY  None ____________________________________________   PROCEDURES  Procedure(s) performed: None  Critical Care performed: No  ____________________________________________   INITIAL IMPRESSION / ASSESSMENT AND PLAN / ED COURSE  Pertinent labs & imaging results that were available during my care of the patient were reviewed by me and considered in my medical decision making (see chart for details).  81 year old male on Eliquis who presents with recurrent nosebleed. Nasal clamp placed at triage. Patient is not bleeding at the time of my exam. Discussed with patient and family various options including placement of Merocel sponge versus nosebleed kit for home use if patient experiences recurrent bleeding. I did discuss with them his nose is likely to rebleed. Patient prefers nosebleed kit (lidocaine plus Afrin) which he will use at home on a cotton ball to pack his nose in the event his nose starts bleeding. I have also instructed him to place a nasal clamp and apply ice for 20 minutes. Strict return precautions given if nose continues to bleed after 20 minutes or for heavy bleeding or if patient experiences symptoms of lightheadedness. Otherwise patient will be seen in the office this morning by his cardiologist. Patient and family verbalized understanding and agree with plan of care. ____________________________________________   FINAL CLINICAL IMPRESSION(S) / ED DIAGNOSES  Final diagnoses:  Epistaxis      Paulette Blanch, MD 05/23/15 [phone]

## 2015-05-23 NOTE — ED Notes (Signed)
Pt reports having nose bleeds on and off today. First nose bleed began at 700 this morning and pt was able to control bleeding with pressure within 20 minutes. 12 hours later pt reports nose bleed began a second time and was unable to control bleeding. Pt spoke with Dr. Waldron Labs and was told to pack the nose and he could see pt in the morning. Bleeding continued and pt came to ED. Nose is bleeding currently and pt has rag to nose. Pt is on eliquis at this time.

## 2015-05-23 NOTE — ED Notes (Signed)
Nose clamp applied in triage.

## 2015-09-02 ENCOUNTER — Ambulatory Visit: Payer: Self-pay

## 2015-10-01 DIAGNOSIS — R2681 Unsteadiness on feet: Secondary | ICD-10-CM | POA: Insufficient documentation

## 2015-10-21 ENCOUNTER — Ambulatory Visit (INDEPENDENT_AMBULATORY_CARE_PROVIDER_SITE_OTHER): Payer: Medicare Other | Admitting: Urology

## 2015-10-21 ENCOUNTER — Other Ambulatory Visit: Payer: Self-pay

## 2015-10-21 ENCOUNTER — Encounter: Payer: Self-pay | Admitting: Urology

## 2015-10-21 VITALS — BP 161/90 | HR 82 | Ht 68.0 in | Wt 203.1 lb

## 2015-10-21 DIAGNOSIS — N400 Enlarged prostate without lower urinary tract symptoms: Secondary | ICD-10-CM | POA: Diagnosis not present

## 2015-10-21 DIAGNOSIS — M1990 Unspecified osteoarthritis, unspecified site: Secondary | ICD-10-CM

## 2015-10-21 HISTORY — DX: Unspecified osteoarthritis, unspecified site: M19.90

## 2015-10-21 LAB — URINALYSIS, COMPLETE
Bilirubin, UA: NEGATIVE
Glucose, UA: NEGATIVE
Leukocytes, UA: NEGATIVE
NITRITE UA: NEGATIVE
PH UA: 5 (ref 5.0–7.5)
Specific Gravity, UA: 1.02 (ref 1.005–1.030)
Urobilinogen, Ur: 0.2 mg/dL (ref 0.2–1.0)

## 2015-10-21 LAB — MICROSCOPIC EXAMINATION

## 2015-10-21 LAB — BLADDER SCAN AMB NON-IMAGING

## 2015-10-21 NOTE — Progress Notes (Signed)
10/21/2015 11:28 AM   David Frey 1930/05/28 [id number]  Referring provider: No referring provider defined for this encounter.  Chief Complaint  Patient presents with  . Benign Prostatic Hypertrophy    New Patient    HPI: Very nice 82 year old white male who presents today to establish care. The patient has a long urologic history starting in his fifties, he underwent a TURP by Dr. Garner Nash. Since that time he has had a strong stream and feels as if he empties his bladder completely. He has been followed and treated by Dr. Lonna Cobb for overactive bladder symptoms. Currently, the patient is taking Flomax 0.4 milligrams and Toviaz 4 mg daily. His symptoms of been stable. He has strong stream. He gets up once or twice at night. He was last seen by Dr. Lonna Cobb in March 2016. At that time, no changes were made to his medications. His PVR was 61. Since he was last seen he has not had any infections, dysuria, or gross hematuria.  The patient's new complaint is a dangling scrotum.  This is most bothersome when he sits down or gets out of the car. Otherwise, the patient is done well over the interval with no significant changes.   PMH: Past Medical History  Diagnosis Date  . Hypertension   . Atrial fibrillation (HCC)   . BP (high blood pressure) 12/13/2014  . Chronic systolic heart failure (HCC) 07/09/2014    Overview:  Global ef 45%   . MI (mitral incompetence) 07/09/2014  . Paroxysmal atrial fibrillation (HCC) 07/09/2014  . Asthma without status asthmaticus 12/13/2014  . Chronic obstructive pulmonary disease (HCC) 12/13/2014  . Arthritis, degenerative 10/21/2015  . Benign fibroma of prostate 12/13/2014  . Benign prostatic hyperplasia with urinary obstruction 10/30/2012  . Impaired renal function 12/13/2014  . Obstruction to urinary outflow 12/13/2014  . Blood glucose elevated 11/28/2014  . Combined fat and carbohydrate induced hyperlipemia 04/01/2014  . Edema leg 04/01/2014  . Excessive  urination at night 10/30/2012  . Gout 12/13/2014  . Methicillin susceptible Staphylococcus aureus infection 02/25/2010    Overview:  Methicillin Susceptible Staphylococcus Aureus Infection   . 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 Appendectomy  1943  . Tonsillectomy  1966  . Hernia repair  1993  . Total knee arthroplasty  2007  . Transurethral resection of prostate  1985    Home Medications:    Medication List       This list is accurate as of: 10/21/15 11:28 AM.  Always use your most recent med list.               apixaban 2.5 MG Tabs tablet  Commonly known as:  ELIQUIS  Take by mouth.     CALCIUM 600 600 MG Tabs tablet  Generic drug:  calcium carbonate  Take 600 mg by mouth.     colchicine 0.6 MG tablet  Reported on 10/21/2015     latanoprost 0.005 % ophthalmic solution  Commonly known as:  XALATAN  Apply to eye.     naproxen 250 MG tablet  Commonly known as:  NAPROSYN  Take by mouth 2 (two) times daily with a meal.     STOOL SOFTENER 100 MG capsule  Generic drug:  docusate sodium  Take by mouth.     tamsulosin  0.4 MG Caps capsule  Commonly known as:  FLOMAX  TAKE ONE CAPSULE EVERY DAY     TOVIAZ 4 MG Tb24 tablet  Generic drug:  fesoterodine  Take by mouth.        Allergies:  Allergies  Allergen Reactions  . Celecoxib Rash    Family History: Family History  Problem Relation Age of Onset  . Bladder Cancer Neg Hx   . Prostate cancer Neg Hx   . Kidney cancer Neg Hx     Social History:  reports that he has never smoked. He does not have any smokeless tobacco history on file. He reports that he does not drink alcohol or use illicit drugs.  ROS: UROLOGY Frequent Urination?: Yes Hard to postpone urination?: No Burning/pain with urination?: No Get  up at night to urinate?: Yes Leakage of urine?: No Urine stream starts and stops?: No Trouble starting stream?: No Do you have to strain to urinate?: No Blood in urine?: No Urinary tract infection?: No Sexually transmitted disease?: No Injury to kidneys or bladder?: No Painful intercourse?: No Weak stream?: No Erection problems?: Yes Penile pain?: No  Gastrointestinal Nausea?: No Vomiting?: Yes Indigestion/heartburn?: No Diarrhea?: Yes Constipation?: Yes  Constitutional Fever: Yes Night sweats?: No Weight loss?: No Fatigue?: No  Skin Skin rash/lesions?: No Itching?: No  Eyes Blurred vision?: No Double vision?: No  Ears/Nose/Throat Sore throat?: No Sinus problems?: No  Hematologic/Lymphatic Swollen glands?: No Easy bruising?: Yes  Cardiovascular Leg swelling?: Yes Chest pain?: No  Respiratory Cough?: Yes Shortness of breath?: Yes  Endocrine Excessive thirst?: No  Musculoskeletal Back pain?: No Joint pain?: No  Neurological Headaches?: No Dizziness?: Yes  Psychologic Depression?: No Anxiety?: No  Physical Exam: BP 161/90 mmHg  Pulse 82  Ht  (1.727 m)  Wt 203 lb 1.6 oz (92.126 kg)  BMI 30.89 kg/m2  Constitutional:  Alert and oriented, No acute distress. HEENT: [place] AT, moist mucus membranes.  Trachea midline, no masses. Cardiovascular: No clubbing, cyanosis, or edema. Respiratory: Normal respiratory effort, no increased work of breathing. GI: Abdomen is soft, nontender, nondistended, no abdominal masses GU: No CVA tenderness. Long thin/dangling scrotum without evidence of hydrocele or hernia Patient's prostate is plus 1 in size, symmetric, no evidence of nodules. Skin: No rashes, bruises or suspicious lesions. Lymph: No cervical or inguinal adenopathy. Neurologic: Grossly intact, no focal deficits, moving all 4 extremities. Psychiatric: Normal mood and affect.  Laboratory Data: Lab Results  Component Value Date   WBC 4.0  12/05/2013   HGB 16.7 12/05/2013   HCT 48.1 12/05/2013   MCV 98 12/05/2013   PLT 121* 12/05/2013    Lab Results  Component Value Date   CREATININE 1.69* 12/05/2013    No results found for: PSA  No results found for: TESTOSTERONE  No results found for: HGBA1C  Urinalysis    Component Value Date/Time   COLORURINE Yellow 12/05/2013 0946   APPEARANCEUR Hazy 12/05/2013 0946   LABSPEC 1.023 12/05/2013 0946   PHURINE 5.0 12/05/2013 0946   GLUCOSEU Negative 12/05/2013 0946   HGBUR 2+ 12/05/2013 0946   BILIRUBINUR Negative 12/05/2013 0946   KETONESUR Negative 12/05/2013 0946   PROTEINUR 30 mg/dL 08/65/7846 9629   NITRITE Negative 12/05/2013 0946   LEUKOCYTESUR Negative 12/05/2013 0946    Pertinent Imaging:   Assessment & Plan:  The patient is doing quite well as it relates to his symptoms. He questions whether or not the Gala Murdoch is making any impact. Further, the patient related to me that he  takes drops for his glaucoma. As such, I recommended that we stop his Toviaz at least for a trilobar myrbetriq 50 mg daily. If the patient's myrbetriq is making a noticeable impact with Allis side effects, we will write a prescription for him that should last 12 months.  1. BPH (benign prostatic hyperplasia) As above, follow-up in 12 months. Prescription for myrbetriq will be written per the patient's request after he tries to sample pack. He will call us for this information. - Urinalysis, Complete - BLADDER SCAN AMB NON-IMAGING   No Follow-up on file.  Crist Fat, MD  [facility] [address], [address] [place], [place] [address] [phone]

## 2015-12-15 ENCOUNTER — Telehealth: Payer: Self-pay | Admitting: Urology

## 2015-12-15 DIAGNOSIS — N400 Enlarged prostate without lower urinary tract symptoms: Secondary | ICD-10-CM

## 2015-12-15 NOTE — Telephone Encounter (Signed)
Pt needs a refill on Toviaz ER 4 mg.  He uses [facility] [address]

## 2015-12-22 NOTE — Telephone Encounter (Signed)
Pt came back into the office this morning requesting a refill of the Toviaz 4mg . Noted hx of glaucoma and in your dictation where you advised pt to stop taking medication. Pt stated that he has not been taking myrbetriq samples due to possible side effect of raising BP. Reinforced with pt to take myrbetriq as you advised previously. Also made pt aware would need doctor approval prior to giving Toviaz due to hx of glaucoma. Pt voiced understanding and stated he would rather have the Toviaz due to side effects if Dr. Marlou PorchHerrick agreed. Please advise.

## 2015-12-23 NOTE — Telephone Encounter (Signed)
If we can document that he has wide angle glaucoma then he can safely use Toviaz.  Otherwise it would not be safe.  Otherwise I would recommend that he try Myrbetriq and let us repeat his BP in one month.

## 2015-12-31 MED ORDER — FESOTERODINE FUMARATE ER 4 MG PO TB24: 4.0000 mg | ORAL_TABLET | Freq: Every day | ORAL | Status: DC

## 2015-12-31 NOTE — Telephone Encounter (Signed)
Spoke with David Frey, Dr. Colonel BaldVin's assistant, in reference to pt glaucoma. Per David Frey pt does have open angle glaucoma therefore Toviaz 4mg  was ordered for pt. David Frey also is faxing over Dr. Colonel BaldVin's notes.   Spoke with pt and made aware David Frey is safe for him to take. Pt stated he has been taking the myrbetriq for the past week and half and his BP has risen 10-12 points. Reinforced with pt to stop myrbetriq and can restart Toviaz. Pt voiced understanding.

## 2016-04-26 ENCOUNTER — Telehealth: Payer: Self-pay | Admitting: Radiology

## 2016-04-26 ENCOUNTER — Other Ambulatory Visit: Payer: Self-pay

## 2016-04-26 DIAGNOSIS — N400 Enlarged prostate without lower urinary tract symptoms: Secondary | ICD-10-CM

## 2016-04-26 MED ORDER — FESOTERODINE FUMARATE ER 4 MG PO TB24: 4.0000 mg | ORAL_TABLET | Freq: Every day | ORAL | 3 refills | Status: DC

## 2016-04-26 NOTE — Telephone Encounter (Signed)
Prescription refill sent to pt pharmacy.

## 2016-04-26 NOTE — Telephone Encounter (Signed)
Pt requests refill of Toviaz.

## 2016-07-17 ENCOUNTER — Encounter: Payer: Self-pay | Admitting: Emergency Medicine

## 2016-07-17 ENCOUNTER — Emergency Department: Payer: Medicare Other

## 2016-07-17 ENCOUNTER — Emergency Department
Admission: EM | Admit: 2016-07-17 | Discharge: 2016-07-17 | Disposition: A | Discharge status: Home / Self Care | Payer: Medicare Other | Attending: Emergency Medicine | Admitting: Emergency Medicine

## 2016-07-17 DIAGNOSIS — Y9389 Activity, other specified: Secondary | ICD-10-CM | POA: Insufficient documentation

## 2016-07-17 DIAGNOSIS — S99911A Unspecified injury of right ankle, initial encounter: Secondary | ICD-10-CM | POA: Diagnosis present

## 2016-07-17 DIAGNOSIS — Z79899 Other long term (current) drug therapy: Secondary | ICD-10-CM | POA: Insufficient documentation

## 2016-07-17 DIAGNOSIS — Y999 Unspecified external cause status: Secondary | ICD-10-CM | POA: Diagnosis not present

## 2016-07-17 DIAGNOSIS — I110 Hypertensive heart disease with heart failure: Secondary | ICD-10-CM | POA: Diagnosis not present

## 2016-07-17 DIAGNOSIS — Y929 Unspecified place or not applicable: Secondary | ICD-10-CM | POA: Diagnosis not present

## 2016-07-17 DIAGNOSIS — W1839XA Other fall on same level, initial encounter: Secondary | ICD-10-CM | POA: Insufficient documentation

## 2016-07-17 DIAGNOSIS — J449 Chronic obstructive pulmonary disease, unspecified: Secondary | ICD-10-CM | POA: Diagnosis not present

## 2016-07-17 DIAGNOSIS — I5022 Chronic systolic (congestive) heart failure: Secondary | ICD-10-CM | POA: Diagnosis not present

## 2016-07-17 DIAGNOSIS — S93401A Sprain of unspecified ligament of right ankle, initial encounter: Secondary | ICD-10-CM | POA: Insufficient documentation

## 2016-07-17 DIAGNOSIS — I252 Old myocardial infarction: Secondary | ICD-10-CM | POA: Insufficient documentation

## 2016-07-17 DIAGNOSIS — J45909 Unspecified asthma, uncomplicated: Secondary | ICD-10-CM | POA: Diagnosis not present

## 2016-07-17 MED ORDER — OXYCODONE-ACETAMINOPHEN 5-325 MG PO TABS: 2.0000 | ORAL_TABLET | Freq: Four times a day (QID) | ORAL | 0 refills | Status: DC | PRN

## 2016-07-17 NOTE — ED Provider Notes (Signed)
[facility] Emergency Department Provider Note        Time seen: ----------------------------------------- 2:06 PM on 07/17/2016 -----------------------------------------    I have reviewed the triage vital signs and the nursing notes.   HISTORY  Chief Complaint Ankle Injury    HPI David Frey is a 82 y.o. male who presents to the ER for several days of right ankle pain. Patient states he bent down in a position Thursday evening working on his grill, his right ankle and foot went underneath himand he complains of worsening swelling and pain in his right ankle since that time. Patient denies any other complaints, any other falls or trauma. Walking worsens his pain.   Past Medical History:  Diagnosis Date  . Arthritis, degenerative 10/21/2015  . Asthma without status asthmaticus 12/13/2014  . Atrial fibrillation (HCC)   . Benign fibroma of prostate 12/13/2014  . Benign prostatic hyperplasia with urinary obstruction 10/30/2012  . Blood glucose elevated 11/28/2014  . BP (high blood pressure) 12/13/2014  . Chronic obstructive pulmonary disease (HCC) 12/13/2014  . Chronic systolic heart failure (HCC) 07/09/2014   Overview:  Global ef 45%   . Combined fat and carbohydrate induced hyperlipemia 04/01/2014  . Edema leg 04/01/2014  . Excessive urination at night 10/30/2012  . Gout 12/13/2014  . Hypertension   . Impaired renal function 12/13/2014  . 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 Methicillin susceptible Staphylococcus aureus infection 02/25/2010   Overview:  Methicillin Susceptible Staphylococcus Aureus Infection   . MI (mitral incompetence) 07/09/2014  . Obstruction to urinary outflow 12/13/2014  . Paroxysmal atrial fibrillation (HCC) 07/09/2014    Patient Active Problem List   Diagnosis Date Noted  . Arthritis, degenerative 10/21/2015   . Unsteadiness 10/01/2015  . Asthma without status asthmaticus 12/13/2014  . Benign fibroma of prostate 12/13/2014  . Chronic obstructive pulmonary disease (HCC) 12/13/2014  . Gout 12/13/2014  . BP (high blood pressure) 12/13/2014  . Obstruction to urinary outflow 12/13/2014  . Impaired renal function 12/13/2014  . Stasis, venous 12/13/2014  . Benign essential HTN 11/28/2014  . Blood glucose elevated 11/28/2014  . Chronic systolic heart failure (HCC) 07/09/2014  . MI (mitral incompetence) 07/09/2014  . Paroxysmal atrial fibrillation (HCC) 07/09/2014  . Edema leg 04/01/2014  . HLD (hyperlipidemia) 04/01/2014  . Combined fat and carbohydrate induced hyperlipemia 04/01/2014  . Breath shortness 04/01/2014  . Benign prostatic hyperplasia with urinary obstruction 10/30/2012  . Excessive urination at night 10/30/2012  . FOM (frequency of micturition) 10/30/2012  . Inflammation of joint of finger 07/11/2012  . Methicillin susceptible Staphylococcus aureus infection 02/25/2010  . 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 APPENDECTOMY  1943  . HERNIA REPAIR  1993  . TONSILLECTOMY  1966  . TOTAL KNEE ARTHROPLASTY  2007  . TRANSURETHRAL RESECTION OF PROSTATE  1985    Allergies Celecoxib  Social History Social History  Substance Use Topics  . Smoking status: Never Smoker  . Smokeless tobacco: Never Used  . Alcohol use No    Review of Systems Constitutional: Negative for fever. Cardiovascular: Negative for chest pain. Respiratory: Negative for shortness of breath. Gastrointestinal: Negative for abdominal pain, vomiting and diarrhea. Musculoskeletal: Positive for ankle pain Skin: Negative for rash. Neurological: Negative for headaches, focal weakness or numbness.  10-point ROS otherwise negative.  ____________________________________________   PHYSICAL  EXAM:  VITAL SIGNS: ED Triage Vitals  Enc Vitals Group     BP 07/17/16 1337 119/81     Pulse Rate 07/17/16 1337 94     Resp 07/17/16 1337 16     Temp 07/17/16 1337 98.4 F (36.9 C)     Temp Source 07/17/16 1337 Oral     SpO2 07/17/16 1337 94 %     Weight 07/17/16 1335 196 lb (88.9 kg)     Height 07/17/16 1335 5\' 6"  (1.676 m)     Head Circumference --      Peak Flow --      Pain Score 07/17/16 1335 4     Pain Loc --      Pain Edu? --      Excl. in GC? --     Constitutional: Alert and oriented. Well appearing and in no distress. Eyes: Conjunctivae are normal.  Normal extraocular movements. Gastrointestinal: Soft and nontender. Normal bowel sounds Musculoskeletal: Pain with range of motion of the right ankle, moderate swelling and ecchymosis noted to the right ankle and proximal right foot Neurologic:  Normal speech and language. No gross focal neurologic deficits are appreciated.  Skin:  Skin is warm, dry and intact. Ecchymosis and swelling to the right foot ____________________________________________  ED COURSE:  Pertinent labs & imaging results that were available during my care of the patient were reviewed by me and considered in my medical decision making (see chart for details). Clinical Course  Patient presents to the ER with likely fracture or sprain. We will assess with basic imaging.  Procedures ____________________________________________   RADIOLOGY Images were viewed by me  Right ankle x-rays unremarkable  ____________________________________________  FINAL ASSESSMENT AND PLAN  Right ankle sprain  Plan: Patient with imaging as dictated above. Patient ankle sprain, placed in a walking boot. He'll be provided pain medicine, encouraged to have close outpatient follow-up with orthopedics.   Emily FilbertWilliams, Greydis Stlouis E, MD   Note: This dictation was prepared with Dragon dictation. Any transcriptional errors that result from this process are unintentional     Emily FilbertJonathan E Yoshika Vensel, MD 07/17/16 1426

## 2016-07-17 NOTE — ED Triage Notes (Signed)
Fell from a bent down position on Thursday evening, right ankle went under patient and patient rolled.  C/O pain and swelling to ankle.

## 2016-07-17 NOTE — ED Notes (Signed)
Boot applied

## 2016-09-03 ENCOUNTER — Other Ambulatory Visit: Payer: Self-pay

## 2016-09-03 DIAGNOSIS — N401 Enlarged prostate with lower urinary tract symptoms: Secondary | ICD-10-CM

## 2016-09-03 MED ORDER — TAMSULOSIN HCL 0.4 MG PO CAPS: 0.4000 mg | ORAL_CAPSULE | Freq: Every day | ORAL | 3 refills | Status: DC

## 2016-09-06 ENCOUNTER — Other Ambulatory Visit: Payer: Self-pay

## 2016-09-06 DIAGNOSIS — N401 Enlarged prostate with lower urinary tract symptoms: Secondary | ICD-10-CM

## 2016-09-06 MED ORDER — FESOTERODINE FUMARATE ER 4 MG PO TB24: 4.0000 mg | ORAL_TABLET | Freq: Every day | ORAL | 3 refills | Status: DC

## 2016-10-18 ENCOUNTER — Ambulatory Visit: Payer: Medicare Other | Admitting: Urology

## 2016-10-19 ENCOUNTER — Encounter: Payer: Self-pay | Admitting: Urology

## 2016-10-20 ENCOUNTER — Ambulatory Visit: Payer: Medicare Other

## 2016-11-09 DIAGNOSIS — M1A00X0 Idiopathic chronic gout, unspecified site, without tophus (tophi): Secondary | ICD-10-CM | POA: Insufficient documentation

## 2016-11-15 ENCOUNTER — Ambulatory Visit: Payer: Medicare Other

## 2016-11-23 ENCOUNTER — Ambulatory Visit (INDEPENDENT_AMBULATORY_CARE_PROVIDER_SITE_OTHER): Payer: Medicare Other | Admitting: Urology

## 2016-11-23 ENCOUNTER — Encounter: Payer: Self-pay | Admitting: Urology

## 2016-11-23 VITALS — BP 128/83 | HR 70 | Ht 67.0 in | Wt 192.2 lb

## 2016-11-23 DIAGNOSIS — R3121 Asymptomatic microscopic hematuria: Secondary | ICD-10-CM

## 2016-11-23 DIAGNOSIS — N400 Enlarged prostate without lower urinary tract symptoms: Secondary | ICD-10-CM | POA: Diagnosis not present

## 2016-11-23 LAB — URINALYSIS, COMPLETE
BILIRUBIN UA: NEGATIVE
Glucose, UA: NEGATIVE
Leukocytes, UA: NEGATIVE
NITRITE UA: NEGATIVE
PH UA: 5.5 (ref 5.0–7.5)
Specific Gravity, UA: 1.015 (ref 1.005–1.030)
UUROB: 0.2 mg/dL (ref 0.2–1.0)

## 2016-11-23 LAB — MICROSCOPIC EXAMINATION

## 2016-11-23 NOTE — Progress Notes (Signed)
11/23/2016 11:27 AM   David Frey 05-25-1930 [id number]  Referring provider: Patrice ParadiseMiriam K McLaughlin, MD [address] [facility] [place] [place], [place] [id number]  Chief Complaint  Patient presents with  . Follow-up    BPH    HPI: Very nice 83 year old white male who presents today to establish care. The patient has a long urologic history starting in his fifties, he underwent a TURP by Dr. Garner Nashaniels. Since that time he has had a strong stream and feels as if he empties his bladder completely. He has been followed and treated by Dr. Lonna CobbStoioff for overactive bladder symptoms. Currently, the patient is taking Flomax 0.4 milligrams and Toviaz 4 mg daily. His symptoms of been stable. He has strong stream. He gets up once or twice at night. He was last seen by Dr. Lonna CobbStoioff in March 2016. At that time, no changes were made to his medications.  His other complaint was a dangling scrotum.   Interval: The patient presents today for annual follow-up. At the last follow-up, we discussed switching from Toviaz to McLeanmyrbetriq given his history of glaucoma. He has since been diagnosed with wide-angle glaucoma and he switched back to Toviaz as he felt like his symptoms are better controlled. Since the patient was last seen he reports a slight increase in his urinary urgency with occasional urge incontinence. He denies any dysuria, gross hematuria, or associated pain. He has not had any infections of the urinary tract since he was last seen.  The patient has no history of kidney stones. He is a nonsmoker. He has no family history of GU malignancy.  PMH: Past Medical History:  Diagnosis Date  . Arthritis, degenerative 10/21/2015  . Asthma without status asthmaticus 12/13/2014  . Atrial fibrillation (HCC)   . Benign fibroma of prostate 12/13/2014  . Benign prostatic hyperplasia with urinary obstruction 10/30/2012  . Blood glucose elevated 11/28/2014  . BP (high blood pressure) 12/13/2014  . Chronic  obstructive pulmonary disease (HCC) 12/13/2014  . Chronic systolic heart failure (HCC) 07/09/2014   Overview:  Global ef 45%   . Combined fat and carbohydrate induced hyperlipemia 04/01/2014  . Edema leg 04/01/2014  . Excessive urination at night 10/30/2012  . Gout 12/13/2014  . Hypertension   . Impaired renal function 12/13/2014  . 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 Methicillin susceptible Staphylococcus aureus infection 02/25/2010   Overview:  Methicillin Susceptible Staphylococcus Aureus Infection   . MI (mitral incompetence) 07/09/2014  . Obstruction to urinary outflow 12/13/2014  . Paroxysmal atrial fibrillation (HCC) 07/09/2014    Surgical History: Past Surgical History:  Procedure Laterality Date  . APPENDECTOMY  1943  . HERNIA REPAIR  1993  . TONSILLECTOMY  1966  . TOTAL KNEE ARTHROPLASTY  2007  . TRANSURETHRAL RESECTION OF PROSTATE  1985    Home Medications:  Allergies as of 11/23/2016      Reactions   Celecoxib Rash      Medication List       Accurate as of 11/23/16 11:27 AM. Always use your most recent med list.          allopurinol 100 MG tablet Commonly known as:  ZYLOPRIM Take 2 tabs daily Start 02/19   apixaban 2.5 MG Tabs tablet Commonly known as:  ELIQUIS Take 1 tablet by mouth 2 (two) times daily.   CALCIUM 600 600 MG Tabs tablet Generic  drug:  calcium carbonate Take 600 mg by mouth.   colchicine 0.6 MG tablet Reported on 10/21/2015   fesoterodine 4 MG Tb24 tablet Commonly known as:  TOVIAZ Take 1 tablet (4 mg total) by mouth daily.   latanoprost 0.005 % ophthalmic solution Commonly known as:  XALATAN Apply to eye.   oxyCODONE-acetaminophen 5-325 MG tablet Commonly known as:  PERCOCET Take 2 tablets by mouth every 6 (six) hours as needed for moderate pain or severe pain.   STOOL SOFTENER 100 MG capsule Generic drug:   docusate sodium Take by mouth.   tamsulosin 0.4 MG Caps capsule Commonly known as:  FLOMAX Take 1 capsule (0.4 mg total) by mouth daily.       Allergies:  Allergies  Allergen Reactions  . Celecoxib Rash    Family History: Family History  Problem Relation Age of Onset  . Bladder Cancer Neg Hx   . Prostate cancer Neg Hx   . Kidney cancer Neg Hx     Social History:  reports that he has never smoked. He has never used smokeless tobacco. He reports that he does not drink alcohol or use drugs.  ROS: UROLOGY Frequent Urination?: No Hard to postpone urination?: No Burning/pain with urination?: No Get up at night to urinate?: Yes Leakage of urine?: No Urine stream starts and stops?: No Trouble starting stream?: No Do you have to strain to urinate?: No Blood in urine?: No Urinary tract infection?: No Sexually transmitted disease?: No Injury to kidneys or bladder?: No Painful intercourse?: No Weak stream?: No Erection problems?: No Penile pain?: No  Gastrointestinal Nausea?: No Vomiting?: No Indigestion/heartburn?: No Diarrhea?: Yes Constipation?: No  Constitutional Fever: No Night sweats?: No Weight loss?: No Fatigue?: Yes  Skin Skin rash/lesions?: No Itching?: No  Eyes Blurred vision?: No Double vision?: No  Ears/Nose/Throat Sore throat?: No Sinus problems?: No  Hematologic/Lymphatic Swollen glands?: No Easy bruising?: Yes  Cardiovascular Leg swelling?: Yes Chest pain?: No  Respiratory Cough?: No Shortness of breath?: No  Endocrine Excessive thirst?: No  Musculoskeletal Back pain?: No Joint pain?: No  Neurological Headaches?: No Dizziness?: No  Psychologic Depression?: No  Physical Exam: BP 128/83   Pulse 70   Ht 5\' 7"  (1.702 m)   Wt 87.2 kg (192 lb 3.2 oz)   BMI 30.10 kg/m   Constitutional:  Alert and oriented, No acute distress. HEENT: [place] AT, moist mucus membranes.  Trachea midline, no masses. Cardiovascular: No  clubbing, cyanosis, or edema. Respiratory: Normal respiratory effort, no increased work of breathing. GI: Abdomen is soft, nontender, nondistended, no abdominal masses GU: No CVA tenderness. Long thin/dangling scrotum without evidence of hydrocele or hernia Patient's prostate is plus 1 in size, symmetric, no evidence of nodules. Skin: No rashes, bruises or suspicious lesions. Lymph: No cervical or inguinal adenopathy. Neurologic: Grossly intact, no focal deficits, moving all 4 extremities. Psychiatric: Normal mood and affect.  Laboratory Data: Lab Results  Component Value Date   WBC 4.0 12/05/2013   HGB 16.7 12/05/2013   HCT 48.1 12/05/2013   MCV 98 12/05/2013   PLT 121 (L) 12/05/2013    Lab Results  Component Value Date   CREATININE 1.69 (H) 12/05/2013   Urinalysis: The patient's urinalysis today demonstrates 3-10 rbc/hpf. He also has 6/10-hpf wipe blood cells.  No results found for: PSA  No results found for: TESTOSTERONE  No results found for: HGBA1C  Urinalysis  Pertinent Imaging:   Assessment & Plan:   The patient's urinary tract symptoms are mostly stable  over the past year on Toviaz and tamsulosin. He will continue these. I recommended no changes to the current regimen.  The patient has a new finding of microscopic hematuria. I discussed the findings with him in detail. I told him that I thought that he should have cystoscopy given that he has had some slight progression and irritative voiding symptoms including urgency and urge incontinence. The patient would like to avoid cystoscopy, and we compromised with having him return for a repeat urinalysis in 6 weeks. If he has persistent microscopic hematuria, I recommend a further hematuria evaluation. Otherwise, the patient can follow this in 12 months.  1. BPH (benign prostatic hyperplasia) As above, follow-up in 12 months.   Return in about 6 weeks (around 01/04/2017) for repeat urine analysis.  Crist Fat, MD  [facility] [address] [place], [place] [address] [phone]

## 2016-11-30 DIAGNOSIS — R001 Bradycardia, unspecified: Secondary | ICD-10-CM | POA: Insufficient documentation

## 2017-01-03 ENCOUNTER — Other Ambulatory Visit: Payer: Self-pay

## 2017-01-03 DIAGNOSIS — R3129 Other microscopic hematuria: Secondary | ICD-10-CM

## 2017-01-04 ENCOUNTER — Other Ambulatory Visit: Payer: Medicare Other

## 2017-01-04 DIAGNOSIS — R3129 Other microscopic hematuria: Secondary | ICD-10-CM

## 2017-01-04 LAB — URINALYSIS, COMPLETE
Bilirubin, UA: NEGATIVE
GLUCOSE, UA: NEGATIVE
Leukocytes, UA: NEGATIVE
Nitrite, UA: NEGATIVE
PH UA: 5 (ref 5.0–7.5)
Specific Gravity, UA: 1.025 (ref 1.005–1.030)
UUROB: 1 mg/dL (ref 0.2–1.0)

## 2017-01-04 LAB — MICROSCOPIC EXAMINATION: BACTERIA UA: NONE SEEN

## 2017-01-11 ENCOUNTER — Other Ambulatory Visit: Payer: Self-pay

## 2017-01-11 DIAGNOSIS — N401 Enlarged prostate with lower urinary tract symptoms: Secondary | ICD-10-CM

## 2017-01-11 MED ORDER — FESOTERODINE FUMARATE ER 4 MG PO TB24: 4.0000 mg | ORAL_TABLET | Freq: Every day | ORAL | 3 refills | Status: DC

## 2017-02-09 DIAGNOSIS — Z79899 Other long term (current) drug therapy: Secondary | ICD-10-CM | POA: Insufficient documentation

## 2017-05-18 ENCOUNTER — Other Ambulatory Visit: Payer: Self-pay

## 2017-05-18 DIAGNOSIS — N401 Enlarged prostate with lower urinary tract symptoms: Secondary | ICD-10-CM

## 2017-05-18 MED ORDER — FESOTERODINE FUMARATE ER 4 MG PO TB24: 4.0000 mg | ORAL_TABLET | Freq: Every day | ORAL | 3 refills | Status: DC

## 2017-06-15 DIAGNOSIS — M79621 Pain in right upper arm: Secondary | ICD-10-CM | POA: Insufficient documentation

## 2017-09-28 ENCOUNTER — Telehealth: Payer: Self-pay | Admitting: Urology

## 2017-09-28 DIAGNOSIS — N401 Enlarged prostate with lower urinary tract symptoms: Secondary | ICD-10-CM

## 2017-09-28 MED ORDER — FESOTERODINE FUMARATE ER 4 MG PO TB24
4.0000 mg | ORAL_TABLET | Freq: Every day | ORAL | 3 refills | Status: DC
Start: 2017-09-28 — End: 2017-10-12

## 2017-09-28 NOTE — Telephone Encounter (Signed)
Spoke with pt wife and made aware medication has been refilled until f/u appt. Wife voiced understanding.

## 2017-09-28 NOTE — Telephone Encounter (Signed)
Patient saw Marlou PorchHerrick in Feb. And needs follow up this Feb and he is out of his Gala Murdochoviaz and wants a refill prior to his app. Can he get this or does he need an app first?   Marcelino DusterMichelle

## 2017-10-12 ENCOUNTER — Ambulatory Visit (INDEPENDENT_AMBULATORY_CARE_PROVIDER_SITE_OTHER): Payer: Medicare Other | Admitting: Urology

## 2017-10-12 ENCOUNTER — Encounter: Payer: Self-pay | Admitting: Urology

## 2017-10-12 VITALS — BP 152/92 | HR 81 | Ht 67.0 in | Wt 189.0 lb

## 2017-10-12 DIAGNOSIS — R3129 Other microscopic hematuria: Secondary | ICD-10-CM | POA: Diagnosis not present

## 2017-10-12 DIAGNOSIS — N401 Enlarged prostate with lower urinary tract symptoms: Secondary | ICD-10-CM

## 2017-10-12 LAB — URINALYSIS, COMPLETE
Bilirubin, UA: NEGATIVE
Glucose, UA: NEGATIVE
Ketones, UA: NEGATIVE
LEUKOCYTES UA: NEGATIVE
Nitrite, UA: NEGATIVE
PH UA: 5.5 (ref 5.0–7.5)
SPEC GRAV UA: 1.02 (ref 1.005–1.030)
Urobilinogen, Ur: 0.2 mg/dL (ref 0.2–1.0)

## 2017-10-12 LAB — BLADDER SCAN AMB NON-IMAGING: Scan Result: 21

## 2017-10-12 LAB — MICROSCOPIC EXAMINATION: WBC, UA: NONE SEEN /hpf (ref 0–?)

## 2017-10-12 MED ORDER — TAMSULOSIN HCL 0.4 MG PO CAPS: 0.4000 mg | ORAL_CAPSULE | Freq: Every day | ORAL | 8 refills | Status: DC

## 2017-10-12 MED ORDER — FESOTERODINE FUMARATE ER 4 MG PO TB24: 4.0000 mg | ORAL_TABLET | Freq: Every day | ORAL | 8 refills | Status: DC

## 2017-10-12 NOTE — Progress Notes (Signed)
10/12/2017 9:25 AM   Bernette Redbirdhomas R Tedrick 04/09/1930 [id number]  Referring provider: Patrice ParadiseMcLaughlin, Miriam K, MD [address] [facility] [place], [place] [id number]  Chief Complaint  Patient presents with  . Other    BPH    HPI: 84 year old male presents for follow-up of BPH and lower urinary tract symptoms.  [facility]  [facility] and had stable lower urinary tract symptoms.  He remains on tamsulosin and Toviaz and has no bothersome voiding symptoms.  He has stable nocturia x1-2.  At his visit last year he was noted to have microscopic hematuria.  Cystoscopy is recommended however he wanted to hold off.  A follow-up urinalysis in April showed no microhematuria.  He denies gross hematuria.  He has no flank, abdominal, pelvic or scrotal pain.  He is a non-smoker.   PMH: Past Medical History:  Diagnosis Date  . Arthritis, degenerative 10/21/2015  . Asthma without status asthmaticus 12/13/2014  . Atrial fibrillation (HCC)   . Benign fibroma of prostate 12/13/2014  . Benign prostatic hyperplasia with urinary obstruction 10/30/2012  . Blood glucose elevated 11/28/2014  . BP (high blood pressure) 12/13/2014  . Chronic obstructive pulmonary disease (HCC) 12/13/2014  . Chronic systolic heart failure (HCC) 07/09/2014   Overview:  Global ef 45%   . Combined fat and carbohydrate induced hyperlipemia 04/01/2014  . Edema leg 04/01/2014  . Excessive urination at night 10/30/2012  . Gout 12/13/2014  . Hypertension   . Impaired renal function 12/13/2014  . 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 Methicillin susceptible Staphylococcus aureus infection 02/25/2010   Overview:  Methicillin Susceptible Staphylococcus Aureus Infection   . MI (mitral incompetence) 07/09/2014  . Obstruction to urinary outflow 12/13/2014  .  Paroxysmal atrial fibrillation (HCC) 07/09/2014    Surgical History: Past Surgical History:  Procedure Laterality Date  . APPENDECTOMY  1943  . HERNIA REPAIR  1993  . TONSILLECTOMY  1966  . TOTAL KNEE ARTHROPLASTY  2007  . TRANSURETHRAL RESECTION OF PROSTATE  1985    Home Medications:  Allergies as of 10/12/2017      Reactions   Celecoxib Rash      Medication List        Accurate as of 10/12/17  9:25 AM. Always use your most recent med list.          allopurinol 100 MG tablet Commonly known as:  ZYLOPRIM Take 2 tabs daily Start 02/19   amLODipine 5 MG tablet Commonly known as:  NORVASC Take by mouth.   apixaban 2.5 MG Tabs tablet Commonly known as:  ELIQUIS Take 1 tablet by mouth 2 (two) times daily.   CALCIUM 600 1500 (600 Ca) MG Tabs tablet Generic drug:  calcium carbonate Take by mouth.   colchicine 0.6 MG tablet Reported on 10/21/2015   docusate sodium 100 MG capsule Commonly known as:  COLACE Take by mouth.   fesoterodine 4 MG Tb24 tablet Commonly known as:  TOVIAZ Take 1 tablet (4 mg total) by mouth daily.   latanoprost 0.005 % ophthalmic solution Commonly known as:  XALATAN Apply to eye.   tamsulosin 0.4 MG Caps capsule Commonly known as:  FLOMAX Take 1 capsule (0.4 mg total) by mouth daily.       Allergies:  Allergies  Allergen Reactions  . Celecoxib Rash  Family History: Family History  Problem Relation Age of Onset  . Bladder Cancer Neg Hx   . Prostate cancer Neg Hx   . Kidney cancer Neg Hx     Social History:  reports that  has never smoked. he has never used smokeless tobacco. He reports that he does not drink alcohol or use drugs.  ROS: UROLOGY Frequent Urination?: No Hard to postpone urination?: No Burning/pain with urination?: No Get up at night to urinate?: Yes Leakage of urine?: No Urine stream starts and stops?: No Trouble starting stream?: No Do you have to strain to urinate?: No Blood in urine?: No Urinary  tract infection?: No Sexually transmitted disease?: No Injury to kidneys or bladder?: No Painful intercourse?: No Weak stream?: No Erection problems?: Yes Penile pain?: No  Gastrointestinal Nausea?: No Vomiting?: No Indigestion/heartburn?: Yes Diarrhea?: No Constipation?: No  Constitutional Fever: No Night sweats?: No Weight loss?: No Fatigue?: No  Skin Skin rash/lesions?: Yes Itching?: No  Eyes Blurred vision?: No Double vision?: No  Ears/Nose/Throat Sore throat?: No Sinus problems?: Yes  Hematologic/Lymphatic Swollen glands?: No Easy bruising?: Yes  Cardiovascular Leg swelling?: Yes Chest pain?: No  Respiratory Cough?: Yes Shortness of breath?: No  Endocrine Excessive thirst?: No  Musculoskeletal Back pain?: No Joint pain?: No  Neurological Headaches?: No Dizziness?: No  Psychologic Depression?: No Anxiety?: No  Physical Exam: BP (!) 152/92   Pulse 81   Ht 5\' 7"  (1.702 m)   Wt 189 lb (85.7 kg)   BMI 29.60 kg/m   Constitutional:  Alert and oriented, No acute distress. HEENT: [place] AT, moist mucus membranes.  Trachea midline, no masses. Cardiovascular: No clubbing, cyanosis, or edema. Respiratory: Normal respiratory effort, no increased work of breathing. GI: Abdomen is soft, nontender, nondistended, no abdominal masses GU: No CVA tenderness.  Prostate 50 gm, smooth without nodules Skin: No rashes, bruises or suspicious lesions. Lymph: No cervical or inguinal adenopathy. Neurologic: Grossly intact, no focal deficits, moving all 4 extremities. Psychiatric: Normal mood and affect.  Laboratory Data: Lab Results  Component Value Date   WBC 4.0 12/05/2013   HGB 16.7 12/05/2013   HCT 48.1 12/05/2013   MCV 98 12/05/2013   PLT 121 (L) 12/05/2013    Lab Results  Component Value Date   CREATININE 1.69 (H) 12/05/2013    Urinalysis Dipstick 2+ blood/2+ protein Microscopy: 3-10 RBCs  Assessment & Plan:   1. Benign prostatic  hyperplasia without lower urinary tract symptoms Stable lower urinary tract symptoms on tamsulosin and Toviaz which were refilled.  PVR by bladder scan was 21 mL.  - Urinalysis, Complete - Bladder Scan (Post Void Residual) in office  2. Microscopic hematuria He does have persistent microhematuria.  The urine specimen was received after he had left.  I would recommend a renal ultrasound and cystoscopy for further evaluation.  He was contacted by phone with these recommendations.  - Urinalysis, Complete - Bladder Scan (Post Void Residual) in office    Riki Altes, MD  [facility] [address], [address] [place], [place] [address] [phone]

## 2017-10-19 ENCOUNTER — Telehealth: Payer: Self-pay

## 2017-10-19 NOTE — Telephone Encounter (Signed)
-----   Message from David AltesScott C Stoioff, MD sent at 10/12/2017  4:43 PM EST ----- Please let Mr. Hinton RaoStadler know his urinalysis did show recurrent microscopic hematuria.  This was present last year at his visit with Dr. Marlou PorchHerrick and he did not want to pursue further evaluation.  Because he has recurrent blood in the urine I would recommend a renal ultrasound and cystoscopy.

## 2017-10-19 NOTE — Telephone Encounter (Signed)
Spoke with pt in reference to microhematuria and needing a RUS and cysto. Pt stated that he is in the process of moving and will call back when things have settled down.

## 2017-11-14 DIAGNOSIS — R42 Dizziness and giddiness: Secondary | ICD-10-CM | POA: Insufficient documentation

## 2017-11-21 ENCOUNTER — Telehealth: Payer: Self-pay | Admitting: Urology

## 2017-11-21 DIAGNOSIS — R3129 Other microscopic hematuria: Secondary | ICD-10-CM

## 2017-11-21 NOTE — Telephone Encounter (Signed)
Patient has agreed to have the RUS and cysto but he will need an order for the RUS put in.   Marcelino DusterMichelle

## 2017-11-22 NOTE — Telephone Encounter (Signed)
The ultrasound order was entered

## 2017-11-30 ENCOUNTER — Other Ambulatory Visit: Payer: Medicare Other | Admitting: Urology

## 2017-12-05 ENCOUNTER — Ambulatory Visit
Admission: RE | Admit: 2017-12-05 | Discharge: 2017-12-05 | Disposition: A | Discharge status: Home / Self Care | Payer: Medicare Other | Source: Ambulatory Visit | Attending: Urology | Admitting: Urology

## 2017-12-05 DIAGNOSIS — R3129 Other microscopic hematuria: Secondary | ICD-10-CM | POA: Diagnosis not present

## 2017-12-06 ENCOUNTER — Telehealth: Payer: Self-pay

## 2017-12-06 NOTE — Telephone Encounter (Signed)
Patient's wife notified/SW 

## 2017-12-06 NOTE — Telephone Encounter (Signed)
-----   Message from Riki AltesScott C Stoioff, MD sent at 12/06/2017  8:38 AM EDT ----- Renal ultrasound showed a small renal cyst-no significant abnormalities noted.

## 2017-12-08 ENCOUNTER — Ambulatory Visit (INDEPENDENT_AMBULATORY_CARE_PROVIDER_SITE_OTHER): Payer: Medicare Other | Admitting: Urology

## 2017-12-08 ENCOUNTER — Encounter: Payer: Self-pay | Admitting: Urology

## 2017-12-08 VITALS — BP 125/64 | HR 92 | Ht 67.0 in | Wt 187.0 lb

## 2017-12-08 DIAGNOSIS — R3129 Other microscopic hematuria: Secondary | ICD-10-CM

## 2017-12-08 LAB — URINALYSIS, COMPLETE
BILIRUBIN UA: NEGATIVE
Glucose, UA: NEGATIVE
Leukocytes, UA: NEGATIVE
Nitrite, UA: NEGATIVE
PH UA: 6 (ref 5.0–7.5)
Specific Gravity, UA: 1.015 (ref 1.005–1.030)
Urobilinogen, Ur: 1 mg/dL (ref 0.2–1.0)

## 2017-12-08 LAB — MICROSCOPIC EXAMINATION
Bacteria, UA: NONE SEEN
EPITHELIAL CELLS (NON RENAL): NONE SEEN /HPF (ref 0–10)

## 2017-12-12 ENCOUNTER — Encounter: Payer: Self-pay | Admitting: Urology

## 2017-12-12 NOTE — Progress Notes (Signed)
   12/12/17  CC:  Chief Complaint  Patient presents with  . Cysto    HPI: 84 year old male with asymptomatic microhematuria.  Renal ultrasound showed a small renal cyst but no significant abnormalities.  Blood pressure 125/64, pulse 92, height 5\' 7"  (1.702 m), weight 187 lb (84.8 kg). NED. A&Ox3.   No respiratory distress   Abd soft, NT, ND Normal phallus with bilateral descended testicles  Cystoscopy Procedure Note  Patient identification was confirmed, informed consent was obtained, and patient was prepped using Betadine solution.  Lidocaine jelly was administered per urethral meatus.    Preoperative abx where received prior to procedure.     Pre-Procedure: - Inspection reveals a normal caliber ureteral meatus.  Procedure: The flexible cystoscope was introduced without difficulty - No urethral strictures/lesions are present. - TUR changes with mild adenoma regrowth.  Mild inflammatory changes and hypervascularity present prostate  - Normal bladder neck - Bilateral ureteral orifices identified - Bladder mucosa  reveals no ulcers, tumors, or lesions - No bladder stones - No trabeculation  Retroflexion shows no significant abnormalities   Post-Procedure: - Patient tolerated the procedure well  Assessment/ Plan: No significant abnormalities noted on cystoscopy or renal ultrasound.  Continue annual follow-up

## 2018-09-08 ENCOUNTER — Telehealth: Payer: Self-pay | Admitting: Urology

## 2018-09-08 DIAGNOSIS — N401 Enlarged prostate with lower urinary tract symptoms: Secondary | ICD-10-CM

## 2018-09-08 MED ORDER — TAMSULOSIN HCL 0.4 MG PO CAPS: 0.4000 mg | ORAL_CAPSULE | Freq: Every day | ORAL | 2 refills | Status: DC

## 2018-09-08 NOTE — Telephone Encounter (Signed)
Pt lmom asking for Rx refill as [facility] states they aren't receiving response from office. Pt would like a refill on Tamsulosin sent to [facility] on [address].

## 2018-09-08 NOTE — Telephone Encounter (Signed)
RX sent in

## 2018-10-12 ENCOUNTER — Ambulatory Visit: Payer: Medicare Other | Admitting: Urology

## 2018-10-26 ENCOUNTER — Telehealth: Payer: Self-pay | Admitting: Urology

## 2018-10-26 DIAGNOSIS — N401 Enlarged prostate with lower urinary tract symptoms: Secondary | ICD-10-CM

## 2018-10-26 NOTE — Telephone Encounter (Signed)
Patient called the office today to request a refill of Toviaz to be sent to [facility] [address] in [place], [place]

## 2018-10-27 MED ORDER — FESOTERODINE FUMARATE ER 4 MG PO TB24: 4.0000 mg | ORAL_TABLET | Freq: Every day | ORAL | 0 refills | Status: DC

## 2018-10-27 NOTE — Telephone Encounter (Signed)
Toviaz sent to pharmacy with note that patient needs to schedule an appt.

## 2018-11-27 ENCOUNTER — Other Ambulatory Visit: Payer: Self-pay | Admitting: Urology

## 2018-11-27 DIAGNOSIS — N401 Enlarged prostate with lower urinary tract symptoms: Secondary | ICD-10-CM

## 2018-11-27 MED ORDER — FESOTERODINE FUMARATE ER 4 MG PO TB24: 4.0000 mg | ORAL_TABLET | Freq: Every day | ORAL | 0 refills | Status: DC

## 2018-11-27 NOTE — Telephone Encounter (Signed)
Pt calling asking for a refill on his Toviaz to [facility] to [facility] on [address]. Please advise.

## 2018-11-27 NOTE — Telephone Encounter (Signed)
Refill sent to pharmacy as requested

## 2018-12-07 ENCOUNTER — Other Ambulatory Visit: Payer: Self-pay | Admitting: Family Medicine

## 2018-12-07 DIAGNOSIS — N401 Enlarged prostate with lower urinary tract symptoms: Secondary | ICD-10-CM

## 2018-12-07 MED ORDER — TAMSULOSIN HCL 0.4 MG PO CAPS: 0.4000 mg | ORAL_CAPSULE | Freq: Every day | ORAL | 2 refills | Status: DC

## 2018-12-11 ENCOUNTER — Telehealth: Payer: Self-pay | Admitting: Urology

## 2018-12-11 NOTE — Telephone Encounter (Signed)
Pt is out of meds (Tamsulosin) and needs [facility]ill [facility] on [address].  He has appt tomorrow and ran out of meds over the weekend.

## 2018-12-12 ENCOUNTER — Ambulatory Visit: Payer: Medicare Other | Admitting: Urology

## 2018-12-14 ENCOUNTER — Encounter: Payer: Self-pay | Admitting: Urology

## 2018-12-14 ENCOUNTER — Other Ambulatory Visit: Payer: Self-pay

## 2018-12-14 ENCOUNTER — Ambulatory Visit (INDEPENDENT_AMBULATORY_CARE_PROVIDER_SITE_OTHER): Payer: Medicare Other | Admitting: Urology

## 2018-12-14 VITALS — BP 148/84 | HR 62 | Ht 67.0 in | Wt 189.8 lb

## 2018-12-14 DIAGNOSIS — N401 Enlarged prostate with lower urinary tract symptoms: Secondary | ICD-10-CM

## 2018-12-14 LAB — BLADDER SCAN AMB NON-IMAGING

## 2018-12-14 MED ORDER — TAMSULOSIN HCL 0.4 MG PO CAPS: 0.4000 mg | ORAL_CAPSULE | Freq: Every day | ORAL | 3 refills | Status: DC

## 2018-12-14 MED ORDER — FESOTERODINE FUMARATE ER 4 MG PO TB24: 4.0000 mg | ORAL_TABLET | Freq: Every day | ORAL | 3 refills | Status: DC

## 2018-12-14 NOTE — Progress Notes (Signed)
12/14/2018 10:01 AM   David Frey April 17, 1930 [id number]  Referring provider: Patrice Paradise, MD [address] [facility] [place] [place] [address]  Chief Complaint  Patient presents with  . Follow-up   Urologic history: 1.  BPH with lower urinary tract symptoms  -On tamsulosin/Toviaz  -TURP 1985  -Cystoscopy 11/2017 TUR changes with mild adenoma regrowth  2.  Microhematuria  -Renal ultrasound with simple cyst  -Cystoscopy as above.  Hypervascularity prostate   HPI: 85 year old male presents for annual follow-up.  He is voiding pattern is stable on tamsulosin and Toviaz.  He has nocturia x1-3.  He does feel his stream has slightly decreased since last year but not significantly.  He is scheduled for placement of a pacemaker next week.  Denies dysuria, gross hematuria or flank/abdominal/pelvic/scrotal pain.   PMH: Past Medical History:  Diagnosis Date  . Arthritis, degenerative 10/21/2015  . Asthma without status asthmaticus 12/13/2014  . Atrial fibrillation (HCC)   . Benign fibroma of prostate 12/13/2014  . Benign prostatic hyperplasia with urinary obstruction 10/30/2012  . Blood glucose elevated 11/28/2014  . BP (high blood pressure) 12/13/2014  . Chronic obstructive pulmonary disease (HCC) 12/13/2014  . Chronic systolic heart failure (HCC) 07/09/2014   Overview:  Global ef 45%   . Combined fat and carbohydrate induced hyperlipemia 04/01/2014  . Edema leg 04/01/2014  . Excessive urination at night 10/30/2012  . Gout 12/13/2014  . Hypertension   . Impaired renal function 12/13/2014  . 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 Methicillin susceptible Staphylococcus aureus infection 02/25/2010   Overview:  Methicillin Susceptible Staphylococcus Aureus Infection   . MI (mitral incompetence) 07/09/2014  . Obstruction to urinary outflow  12/13/2014  . Paroxysmal atrial fibrillation (HCC) 07/09/2014    Surgical History: Past Surgical History:  Procedure Laterality Date  . APPENDECTOMY  1943  . HERNIA REPAIR  1993  . TONSILLECTOMY  1966  . TOTAL KNEE ARTHROPLASTY  2007  . TRANSURETHRAL RESECTION OF PROSTATE  1985    Home Medications:  Allergies as of 12/14/2018      Reactions   Celecoxib Rash, Other (See Comments)   Hallucination      Medication List       Accurate as of December 14, 2018 10:01 AM. Always use your most recent med list.        amLODipine 5 MG tablet Commonly known as:  NORVASC Take 5 mg by mouth daily.   Combigan 0.2-0.5 % ophthalmic solution Generic drug:  brimonidine-timolol Place 1 drop into the left eye 2 (two) times daily.   docusate sodium 100 MG capsule Commonly known as:  COLACE Take 100 mg by mouth 2 (two) times daily as needed (constipation).   Eliquis 5 MG Tabs tablet Generic drug:  apixaban Take 5 mg by mouth 2 (two) times daily.   fesoterodine 4 MG Tb24 tablet Commonly known as:  Toviaz Take 1 tablet (4 mg total) by mouth daily.   latanoprost 0.005 % ophthalmic solution Commonly known as:  XALATAN Place 1 drop into both eyes at bedtime.   naproxen sodium 220 MG tablet Commonly known as:  ALEVE Take 220 mg by mouth 2 (two) times daily.   tamsulosin 0.4 MG Caps capsule Commonly known as:  FLOMAX Take 1 capsule (0.4 mg total) by mouth daily.       Allergies:  Allergies  Allergen Reactions  . Celecoxib Rash and Other (See Comments)    Hallucination    Family History: Family History  Problem Relation Age of Onset  . Bladder Cancer Neg Hx   . Prostate cancer Neg Hx   . Kidney cancer Neg Hx     Social History:  reports that he has never smoked. He has never used smokeless tobacco. He reports that he does not drink alcohol or use drugs.  ROS: UROLOGY Frequent Urination?: Yes Hard to postpone urination?: No Burning/pain with urination?: No Get up at  night to urinate?: Yes Leakage of urine?: No Urine stream starts and stops?: No Trouble starting stream?: No Do you have to strain to urinate?: No Blood in urine?: No Urinary tract infection?: No Sexually transmitted disease?: No Injury to kidneys or bladder?: No Painful intercourse?: No Weak stream?: No Erection problems?: Yes Penile pain?: No  Gastrointestinal Nausea?: No Vomiting?: No Indigestion/heartburn?: No Diarrhea?: No Constipation?: No  Constitutional Fever: No Night sweats?: No Weight loss?: No Fatigue?: Yes  Skin Skin rash/lesions?: No Itching?: No  Eyes Blurred vision?: No Double vision?: No  Ears/Nose/Throat Sore throat?: No Sinus problems?: No  Hematologic/Lymphatic Swollen glands?: No Easy bruising?: No  Cardiovascular Leg swelling?: No Chest pain?: No  Respiratory Cough?: No Shortness of breath?: No  Endocrine Excessive thirst?: No  Musculoskeletal Back pain?: No Joint pain?: No  Neurological Headaches?: No Dizziness?: No  Psychologic Depression?: No Anxiety?: No  Physical Exam: BP (!) 148/84 (BP Location: Left Arm, Patient Position: Sitting, Cuff Size: Normal)   Pulse 62   Ht 5\' 7"  (1.702 m)   Wt 189 lb 12.8 oz (86.1 kg)   BMI 29.73 kg/m   Constitutional:  Alert and oriented, No acute distress. HEENT: [place] AT, moist mucus membranes.  Trachea midline, no masses. Cardiovascular: No clubbing, cyanosis, or edema. Respiratory: Normal respiratory effort, no increased work of breathing. Skin: No rashes, bruises or suspicious lesions. Neurologic: Grossly intact, no focal deficits, moving all 4 extremities. Psychiatric: Normal mood and affect.   Assessment & Plan:   85 year old male with stable lower urinary tract symptoms on tamsulosin and Toviaz.  These medications were refilled.  PVR by bladder scan today was 12 mL.   Continue annual follow-up.  Riki Altes, MD  [facility] [address] [place], [place] [address] [phone]

## 2018-12-14 NOTE — Patient Instructions (Signed)

## 2018-12-18 ENCOUNTER — Encounter
Admission: RE | Disposition: A | Discharge status: Home / Self Care | Payer: Self-pay | Source: Home / Self Care | Attending: Cardiology

## 2018-12-18 ENCOUNTER — Other Ambulatory Visit: Payer: Self-pay

## 2018-12-18 ENCOUNTER — Observation Stay
Admission: RE | Admit: 2018-12-18 | Discharge: 2018-12-19 | Disposition: A | Discharge status: Home / Self Care | Payer: Medicare Other | Attending: Cardiology | Admitting: Cardiology

## 2018-12-18 DIAGNOSIS — I4891 Unspecified atrial fibrillation: Secondary | ICD-10-CM | POA: Insufficient documentation

## 2018-12-18 DIAGNOSIS — I495 Sick sinus syndrome: Principal | ICD-10-CM | POA: Diagnosis present

## 2018-12-18 DIAGNOSIS — R0600 Dyspnea, unspecified: Secondary | ICD-10-CM | POA: Diagnosis not present

## 2018-12-18 DIAGNOSIS — I110 Hypertensive heart disease with heart failure: Secondary | ICD-10-CM | POA: Insufficient documentation

## 2018-12-18 DIAGNOSIS — E785 Hyperlipidemia, unspecified: Secondary | ICD-10-CM | POA: Diagnosis not present

## 2018-12-18 DIAGNOSIS — E119 Type 2 diabetes mellitus without complications: Secondary | ICD-10-CM | POA: Diagnosis not present

## 2018-12-18 DIAGNOSIS — Z79899 Other long term (current) drug therapy: Secondary | ICD-10-CM | POA: Insufficient documentation

## 2018-12-18 DIAGNOSIS — I5022 Chronic systolic (congestive) heart failure: Secondary | ICD-10-CM | POA: Insufficient documentation

## 2018-12-18 DIAGNOSIS — Z7901 Long term (current) use of anticoagulants: Secondary | ICD-10-CM | POA: Insufficient documentation

## 2018-12-18 HISTORY — PX: PACEMAKER LEADLESS INSERTION: EP1219

## 2018-12-18 SURGERY — PACEMAKER LEADLESS INSERTION
Anesthesia: Moderate Sedation

## 2018-12-18 MED ORDER — FLUTICASONE PROPIONATE 50 MCG/ACT NA SUSP
1.0000 | Freq: Every day | NASAL | Status: DC
  Filled 2018-12-18: qty 16

## 2018-12-18 MED ORDER — SODIUM CHLORIDE 0.9 % IV SOLN: 250.0000 mL | INTRAVENOUS | Status: DC | PRN

## 2018-12-18 MED ORDER — SODIUM CHLORIDE 0.9% FLUSH
3.0000 mL | Freq: Two times a day (BID) | INTRAVENOUS | Status: DC
  Administered 2018-12-18: 3 mL via INTRAVENOUS

## 2018-12-18 MED ORDER — HEPARIN SODIUM (PORCINE) 1000 UNIT/ML IJ SOLN
INTRAMUSCULAR | Status: AC
  Filled 2018-12-18: qty 1

## 2018-12-18 MED ORDER — CEFAZOLIN SODIUM-DEXTROSE 2-4 GM/100ML-% IV SOLN: 2.0000 g | INTRAVENOUS | Status: DC

## 2018-12-18 MED ORDER — TAMSULOSIN HCL 0.4 MG PO CAPS
0.4000 mg | ORAL_CAPSULE | Freq: Every day | ORAL | Status: DC
  Administered 2018-12-18: 0.4 mg via ORAL
  Filled 2018-12-18: qty 1

## 2018-12-18 MED ORDER — AMLODIPINE BESYLATE 5 MG PO TABS
5.0000 mg | ORAL_TABLET | Freq: Every day | ORAL | Status: DC
  Administered 2018-12-18 – 2018-12-19 (×2): 5 mg via ORAL
  Filled 2018-12-18 (×2): qty 1

## 2018-12-18 MED ORDER — FENTANYL CITRATE (PF) 100 MCG/2ML IJ SOLN
INTRAMUSCULAR | Status: AC
  Filled 2018-12-18: qty 2

## 2018-12-18 MED ORDER — ACETAMINOPHEN 325 MG PO TABS: 650.0000 mg | ORAL_TABLET | ORAL | Status: DC | PRN

## 2018-12-18 MED ORDER — IOPAMIDOL (ISOVUE-300) INJECTION 61%
INTRAVENOUS | Status: DC | PRN
  Administered 2018-12-18: 40 mL via INTRAVENOUS

## 2018-12-18 MED ORDER — ONDANSETRON HCL 4 MG/2ML IJ SOLN: 4.0000 mg | Freq: Four times a day (QID) | INTRAMUSCULAR | Status: DC | PRN

## 2018-12-18 MED ORDER — SODIUM CHLORIDE 0.9 % IV SOLN
INTRAVENOUS | Status: DC
  Administered 2018-12-18: 12:00:00 via INTRAVENOUS

## 2018-12-18 MED ORDER — SODIUM CHLORIDE 0.45 % IV SOLN: INTRAVENOUS | Status: DC

## 2018-12-18 MED ORDER — FENTANYL CITRATE (PF) 100 MCG/2ML IJ SOLN
INTRAMUSCULAR | Status: DC | PRN
  Administered 2018-12-18: 25 ug via INTRAVENOUS

## 2018-12-18 MED ORDER — FESOTERODINE FUMARATE ER 4 MG PO TB24
4.0000 mg | ORAL_TABLET | Freq: Every day | ORAL | Status: DC
  Administered 2018-12-19: 4 mg via ORAL
  Filled 2018-12-18: qty 1

## 2018-12-18 MED ORDER — CALCIUM CARBONATE-VITAMIN D 500-200 MG-UNIT PO TABS
2.0000 | ORAL_TABLET | Freq: Every day | ORAL | Status: DC
  Administered 2018-12-19: 2 via ORAL
  Filled 2018-12-18: qty 2

## 2018-12-18 MED ORDER — HEPARIN (PORCINE) IN NACL 1000-0.9 UT/500ML-% IV SOLN
INTRAVENOUS | Status: AC
  Filled 2018-12-18: qty 500

## 2018-12-18 MED ORDER — HEPARIN SODIUM (PORCINE) 1000 UNIT/ML IJ SOLN
INTRAMUSCULAR | Status: DC | PRN
  Administered 2018-12-18: 3000 [IU] via INTRAVENOUS

## 2018-12-18 MED ORDER — SODIUM CHLORIDE 0.9% FLUSH: 3.0000 mL | INTRAVENOUS | Status: DC | PRN

## 2018-12-18 MED ORDER — ALLOPURINOL 300 MG PO TABS
300.0000 mg | ORAL_TABLET | Freq: Every day | ORAL | Status: DC
  Filled 2018-12-18 (×2): qty 1

## 2018-12-18 MED ORDER — MIDAZOLAM HCL 2 MG/2ML IJ SOLN
INTRAMUSCULAR | Status: DC | PRN
  Administered 2018-12-18: 1 mg via INTRAVENOUS

## 2018-12-18 MED ORDER — CEFAZOLIN SODIUM-DEXTROSE 2-4 GM/100ML-% IV SOLN
2.0000 g | Freq: Once | INTRAVENOUS | Status: AC
  Administered 2018-12-18: 2 g via INTRAVENOUS

## 2018-12-18 MED ORDER — MIDAZOLAM HCL 2 MG/2ML IJ SOLN
INTRAMUSCULAR | Status: AC
  Filled 2018-12-18: qty 2

## 2018-12-18 MED ORDER — DOCUSATE SODIUM 100 MG PO CAPS
100.0000 mg | ORAL_CAPSULE | Freq: Every day | ORAL | Status: DC
  Administered 2018-12-19: 100 mg via ORAL
  Filled 2018-12-18: qty 1

## 2018-12-18 MED ORDER — CEFAZOLIN SODIUM-DEXTROSE 2-4 GM/100ML-% IV SOLN
INTRAVENOUS | Status: AC
  Administered 2018-12-18: 2 g via INTRAVENOUS
  Filled 2018-12-18: qty 100

## 2018-12-18 SURGICAL SUPPLY — 15 items
CANNULA 5F STIFF (CANNULA) ×3 IMPLANT
DILATOR VESSEL 38 20CM 12FR (INTRODUCER) ×3 IMPLANT
DILATOR VESSEL 38 20CM 14FR (INTRODUCER) ×3 IMPLANT
DILATOR VESSEL 38 20CM 18FR (INTRODUCER) ×3 IMPLANT
DILATOR VESSEL 38 20CM 8FR (INTRODUCER) ×3 IMPLANT
GUIDEWIRE SUPER STIFF .035X180 (WIRE) ×3 IMPLANT
KIT MANI 3VAL PERCEP (MISCELLANEOUS) ×3 IMPLANT
MICRA TRANSCATH PACING SYSTEM (Pacemaker) ×3 IMPLANT
NEEDLE PERC 18GX7CM (NEEDLE) ×3 IMPLANT
PACK CARDIAC CATH (CUSTOM PROCEDURE TRAY) ×3 IMPLANT
SHEATH AVANTI 7FRX11 (SHEATH) ×3 IMPLANT
SYSTEM PACING TRNSCTH MICRA (Pacemaker) ×1 IMPLANT
TUBING CIL FLEX 10 FLL-RA (TUBING) ×3 IMPLANT
WIRE GUIDERIGHT .035X150 (WIRE) ×3 IMPLANT
WIRE HITORQ VERSACORE ST 145CM (WIRE) ×3 IMPLANT

## 2018-12-18 NOTE — Progress Notes (Signed)
Patient was transferred to the unit at the appropriate time. Dr. Jasmine December seen patient at the bedside and felt that the site appeared well and patient could ambulate. Patient went to stand for the first time post straight leg time, bleeding began immediately. Patient was immediately laid down and pressure was applied. Dr. Jasmine December was called by Nehemiah Settle, RN and informed. Dr. Jasmine December assessed the patient at the bedside again and stated to continue to hold pressure to transfer patient to the bed then hold for another 10 minutes. Pressure was held for approximately 25 minutes. A new gauze and tegaderm was applied once pressure was released. Groin was observed by both myself and Shanda Bumps, Charity fundraiser. Per Dr. Jasmine December patient is to be bedrest for the remainder of the night.

## 2018-12-18 NOTE — Progress Notes (Signed)
Wife called and given an update per the request of the patient.

## 2018-12-19 ENCOUNTER — Encounter: Payer: Self-pay | Admitting: Cardiology

## 2018-12-19 DIAGNOSIS — I495 Sick sinus syndrome: Secondary | ICD-10-CM | POA: Diagnosis not present

## 2018-12-19 LAB — BASIC METABOLIC PANEL
Anion gap: 6 (ref 5–15)
BUN: 20 mg/dL (ref 8–23)
CALCIUM: 8.2 mg/dL — AB (ref 8.9–10.3)
CO2: 24 mmol/L (ref 22–32)
Chloride: 109 mmol/L (ref 98–111)
Creatinine, Ser: 1.01 mg/dL (ref 0.61–1.24)
GFR calc Af Amer: >60 mL/min (ref >60)
GFR calc non Af Amer: >60 mL/min (ref >60)
Glucose, Bld: 103 mg/dL — ABNORMAL HIGH (ref 70–99)
Potassium: 4.1 mmol/L (ref 3.5–5.1)
Sodium: 139 mmol/L (ref 135–145)

## 2018-12-19 LAB — CBC
HCT: 42.7 % (ref 39.0–52.0)
Hemoglobin: 14.5 g/dL (ref 13.0–17.0)
MCH: 33.6 pg (ref 26.0–34.0)
MCHC: 34 g/dL (ref 30.0–36.0)
MCV: 99.1 fL (ref 80.0–100.0)
PLATELETS: 109 10*3/uL — AB (ref 150–400)
RBC: 4.31 MIL/uL (ref 4.22–5.81)
RDW: 13.2 % (ref 11.5–15.5)
WBC: 5.4 10*3/uL (ref 4.0–10.5)
nRBC: 0 % (ref 0.0–0.2)

## 2018-12-19 NOTE — Progress Notes (Signed)
Pt's called out saying he was bleeding, this RN came into room, right groin site where they went in to place leadless pacemaker was saturated in blood, bed also saturated in blood. No pain, vitals stable. Manual pressure held for 30 mins. Dr. Gwen Pounds paged. Gave orders to continue to hold pressure, then place a PAD on site. Orders to get a CBC as well. Will continue to monitor. Shirley Friar, RN, BSN   12/19/18- 0005: PAD placed with 30 ml of air

## 2018-12-19 NOTE — Discharge Instructions (Signed)
For the next week, please avoid squatting, lifting anything greater than 15 pounds, or taking stairs. Do not resume Eliquis yet. This will be discussed at your follow-up appointment on Friday.

## 2018-12-19 NOTE — Discharge Summary (Signed)
    Physician Discharge Summary  Patient ID: David Frey MRN: [id number] DOB/AGE: Apr 17, 1930 85 y.o.  Admit date: 12/18/2018 Discharge date: 12/19/2018  Primary Discharge Diagnosis Sick sinus syndrome Secondary Discharge Diagnosis Sick sinus syndrome  Significant Diagnostic Studies: no  Consults: None  Hospital Course: 85 year old gentleman with a diagnosis of sick sinus syndrome who failed conservative measures and elected for leadless pacemaker implantation, which was performed on 12/18/2018 without perioperative complications. Once transferred to the floor, and after standing, the patient did experience bleeding at the groin site. Pressure was held, and a PAD was ultimately placed. The PAD was removed this morning.The patient has had no recurrent bleeding. CBC is stable. The patient denies chest pain, shortness of breath, or pain. ECG today reveals ventricular paced rhythm at a rate of 70 bpm.    Discharge Exam: Blood pressure 129/83, pulse 71, temperature 98.9 F (37.2 C), temperature source Oral, resp. rate 18, height 5\' 7"  (1.702 m), weight 85.3 kg, SpO2 94 %.   General appearance: alert, cooperative, appears stated age and no distress Head: Normocephalic, without obvious abnormality, atraumatic Eyes: negative Resp: normal effort of breathing on room air. Cardio: irregularly irregular rhythm Extremities: extremities normal, atraumatic, no cyanosis or edema Skin: warm, dry, no diaphoresis Neurologic: Grossly normal Right groin: PAD removed, no bleeding, drainage, erythema, or edema. Suture in place Labs:   Lab Results  Component Value Date   WBC 5.4 12/19/2018   HGB 14.5 12/19/2018   HCT 42.7 12/19/2018   MCV 99.1 12/19/2018   PLT 109 (L) 12/19/2018    Recent Labs  Lab 12/19/18 0324  NA 139  K 4.1  CL 109  CO2 24  BUN 20  CREATININE 1.01  CALCIUM 8.2*  GLUCOSE 103*       EKG: ventricular paced rhythm, 70 bpm  FOLLOW UP PLANS AND  APPOINTMENTS  Allergies as of 12/19/2018      Reactions   Celecoxib Rash, Other (See Comments)   Hallucination      Medication List    STOP taking these medications   naproxen sodium 220 MG tablet Commonly known as:  ALEVE     TAKE these medications   amLODipine 5 MG tablet Commonly known as:  NORVASC Take 5 mg by mouth daily.   Combigan 0.2-0.5 % ophthalmic solution Generic drug:  brimonidine-timolol Place 1 drop into the left eye 2 (two) times daily.   docusate sodium 100 MG capsule Commonly known as:  COLACE Take 100 mg by mouth 2 (two) times daily as needed (constipation).   Eliquis 5 MG Tabs tablet Generic drug:  apixaban Take 5 mg by mouth 2 (two) times daily.   fesoterodine 4 MG Tb24 tablet Commonly known as:  Toviaz Take 1 tablet (4 mg total) by mouth daily.   latanoprost 0.005 % ophthalmic solution Commonly known as:  XALATAN Place 1 drop into both eyes at bedtime.   tamsulosin 0.4 MG Caps capsule Commonly known as:  FLOMAX Take 1 capsule (0.4 mg total) by mouth daily.      Follow-up Information    Lamar Blinks, MD. Go on 12/22/2018.   Specialty:  Cardiology Contact information: [address] [facility] [place] [address] [phone]           BRING ALL MEDICATIONS WITH YOU TO FOLLOW UP APPOINTMENTS  Time spent with patient to include physician time: 25 minutes Signed:  Leanora Ivanoff PA-C 12/19/2018, 8:57 AM

## 2018-12-19 NOTE — Progress Notes (Signed)
Discharge instructions explained to pt/ verbalized an understanding/ iv and tele removed/ ambulated around nursing station/ tolerated well/ R groin site WNL/ transported off unit via wheelchair.

## 2019-02-13 IMAGING — US US RENAL
2 series · 14 of 25 positions shown · non-contrast
Comparison: None.

CLINICAL DATA: 88-year-old male with microhematuria. Initial
encounter.

EXAM:
RENAL / URINARY TRACT ULTRASOUND COMPLETE

[Series 1: us renal · 13 of 41 slices shown (1 of 2)]
[im 1/41]
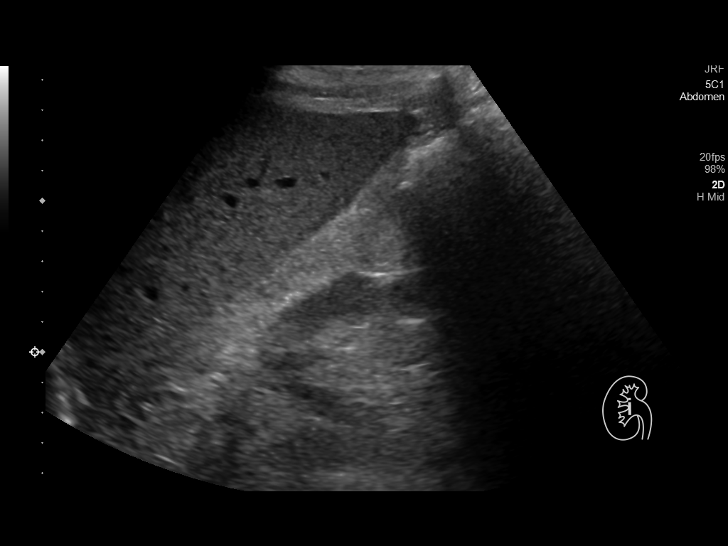
[im 4/41]
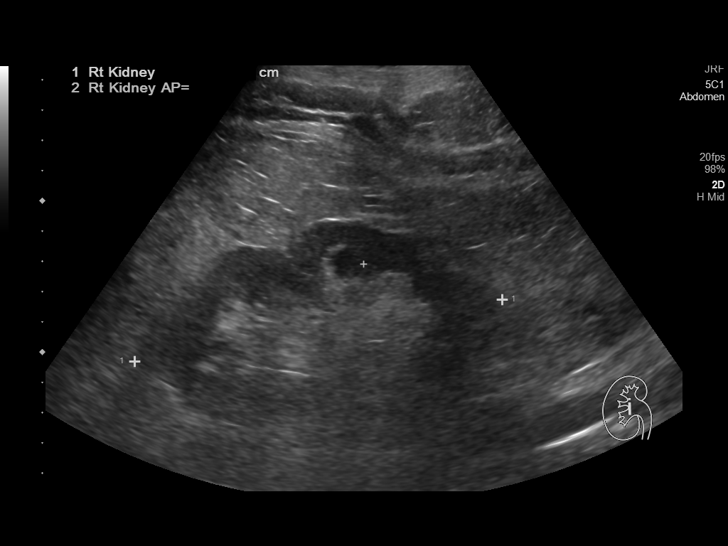
[im 7/41]
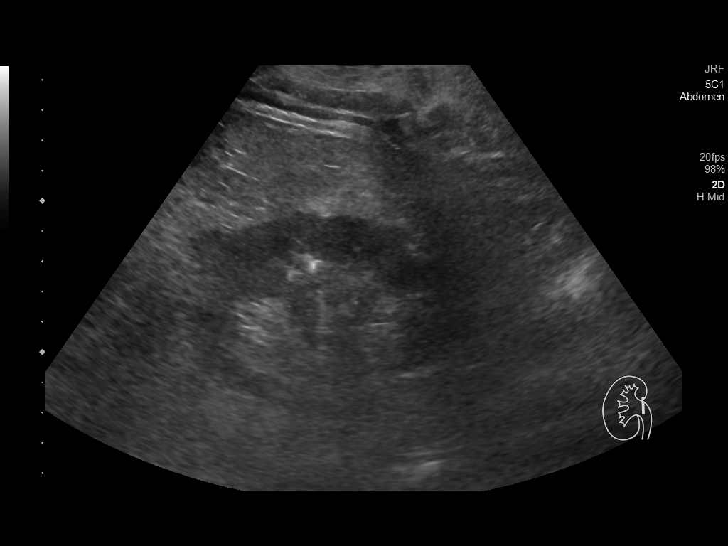
[im 11/41]
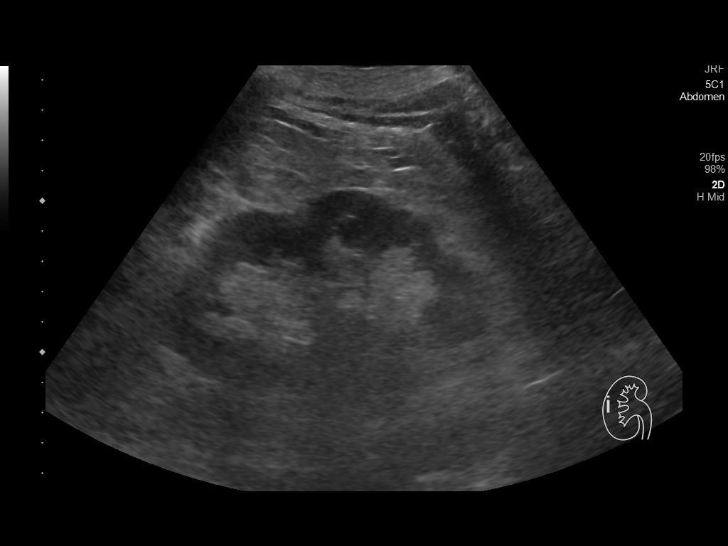
[im 14/41]
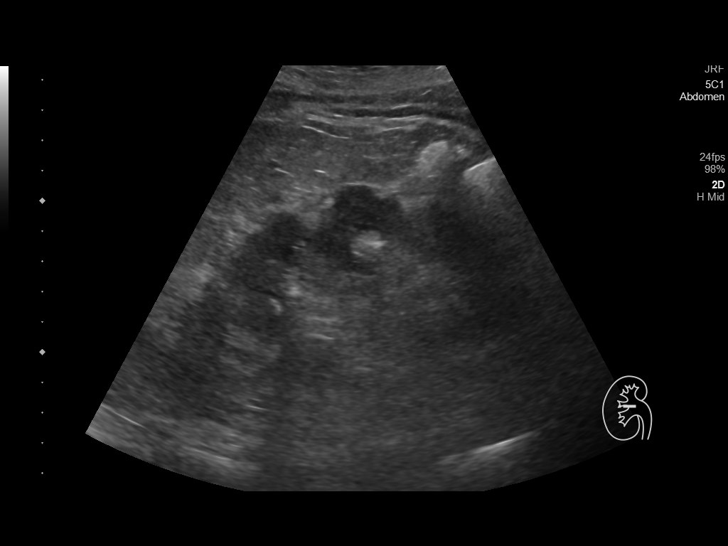
[im 16/41]
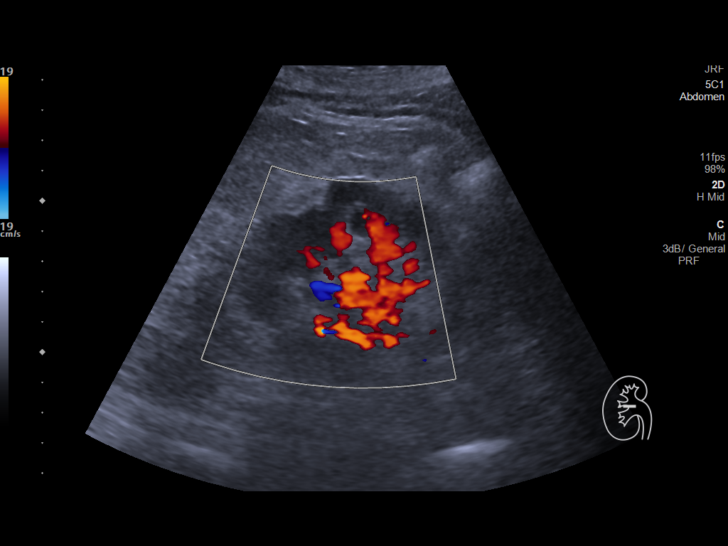
[im 20/41]
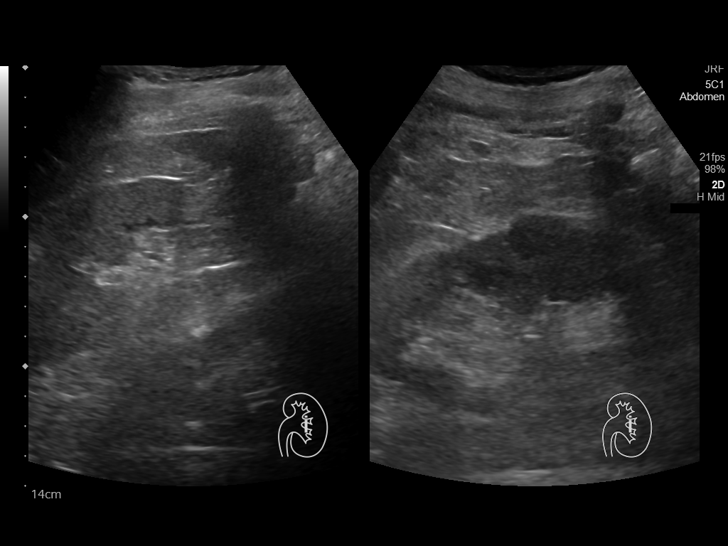
[im 23/41]
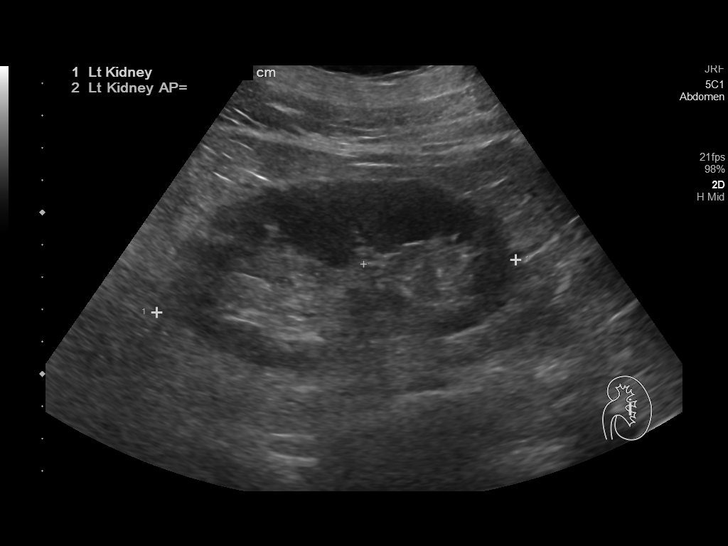
[im 27/41]
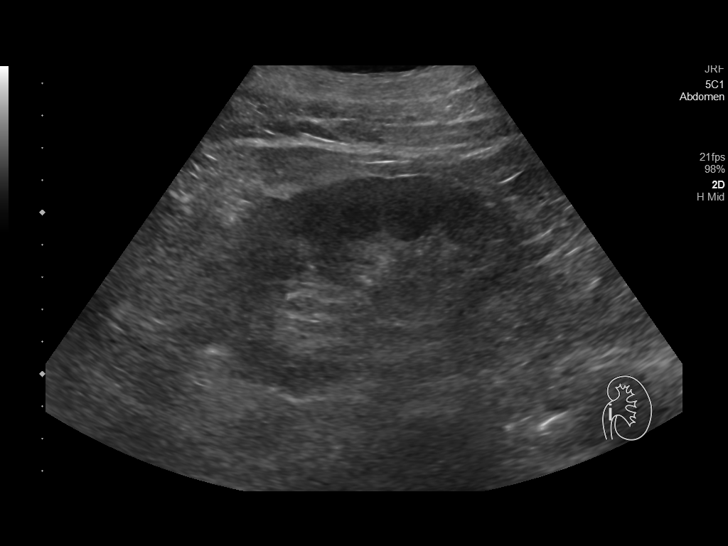
[im 28/41]
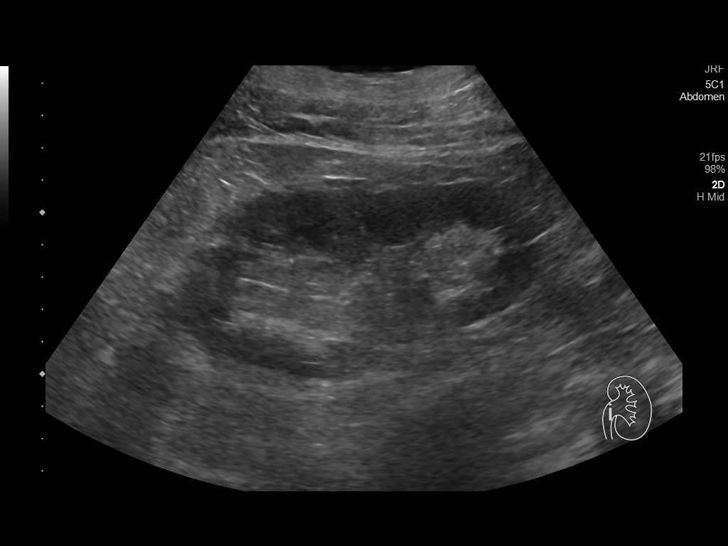
[im 32/41]
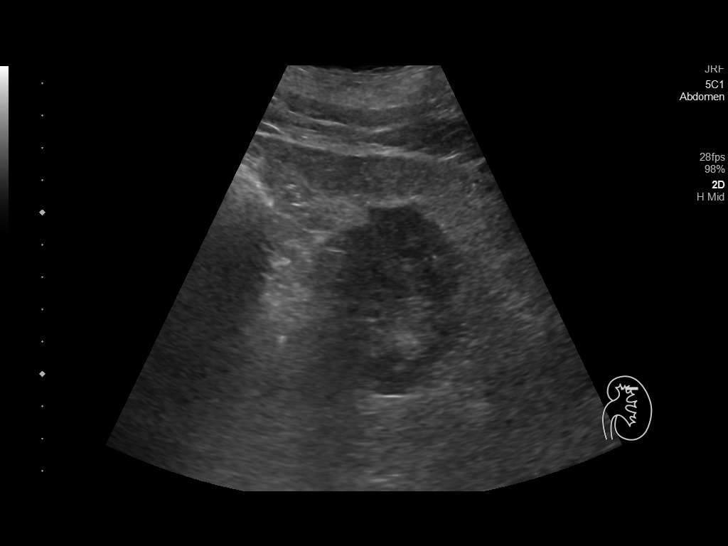
[im 35/41]
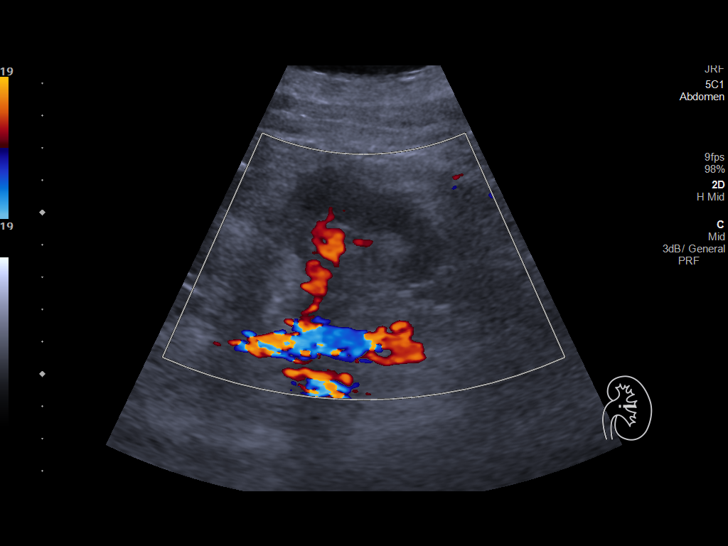
[im 39/41]
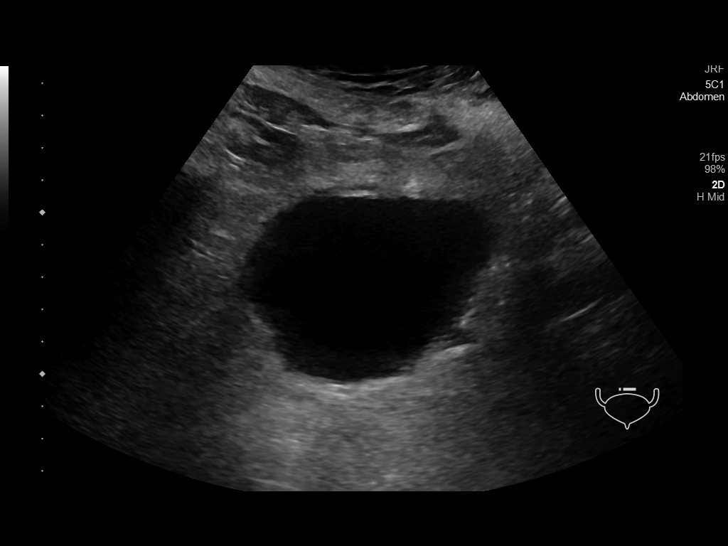

[Series 1001: us renal · 1 of 2 slices shown (2 of 2)]
[im 1/2]
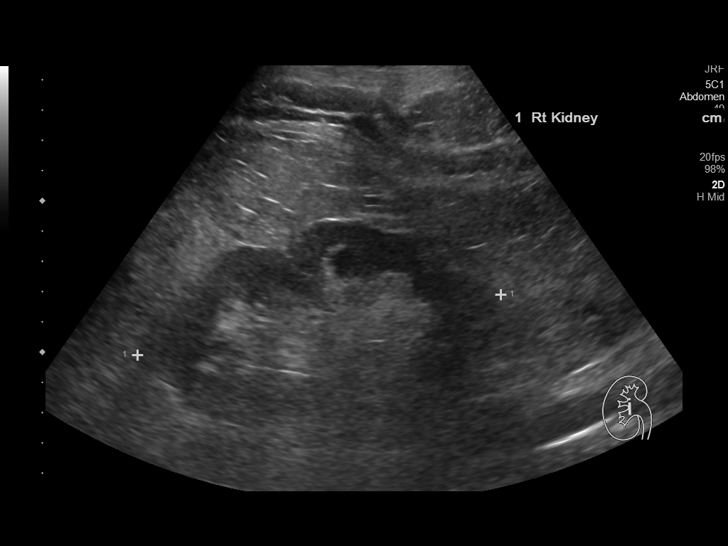

[14 of 25 positions shown; findings below may reference images not displayed]

FINDINGS: Right Kidney:

Length: 12.3 cm.. Echogenicity within normal limits. Lobulated
contour without worrisome mass. 1 cm cyst mid aspect right kidney.
No hydronephrosis.

Left Kidney:

Length: 11.2 cm. Echogenicity within normal limits. No mass or
hydronephrosis visualized.

Bladder:

Appears normal for degree of bladder distention. Bilateral ureteral
jets noted.
IMPRESSION: Lobulated contour of the right kidney without worrisome mass noted.
1 cm cyst mid aspect right kidney incidentally noted.

No hydronephrosis.

## 2019-11-17 ENCOUNTER — Other Ambulatory Visit: Payer: Self-pay | Admitting: Urology

## 2019-11-17 DIAGNOSIS — N401 Enlarged prostate with lower urinary tract symptoms: Secondary | ICD-10-CM

## 2019-12-13 ENCOUNTER — Ambulatory Visit: Payer: Medicare Other | Admitting: Urology

## 2019-12-19 ENCOUNTER — Other Ambulatory Visit: Payer: Self-pay | Admitting: Urology

## 2019-12-19 DIAGNOSIS — N401 Enlarged prostate with lower urinary tract symptoms: Secondary | ICD-10-CM

## 2019-12-24 ENCOUNTER — Other Ambulatory Visit: Payer: Self-pay | Admitting: Urology

## 2019-12-24 DIAGNOSIS — N401 Enlarged prostate with lower urinary tract symptoms: Secondary | ICD-10-CM

## 2019-12-25 ENCOUNTER — Other Ambulatory Visit: Payer: Self-pay | Admitting: Family Medicine

## 2019-12-25 DIAGNOSIS — N401 Enlarged prostate with lower urinary tract symptoms: Secondary | ICD-10-CM

## 2019-12-25 MED ORDER — FESOTERODINE FUMARATE ER 4 MG PO TB24: 4.0000 mg | ORAL_TABLET | Freq: Every day | ORAL | 3 refills | Status: DC

## 2019-12-31 ENCOUNTER — Other Ambulatory Visit: Payer: Self-pay

## 2019-12-31 DIAGNOSIS — N401 Enlarged prostate with lower urinary tract symptoms: Secondary | ICD-10-CM

## 2019-12-31 MED ORDER — TAMSULOSIN HCL 0.4 MG PO CAPS: 0.4000 mg | ORAL_CAPSULE | Freq: Every day | ORAL | 3 refills | Status: DC

## 2020-01-07 ENCOUNTER — Ambulatory Visit: Payer: Medicare Other | Admitting: Dermatology

## 2020-01-16 ENCOUNTER — Encounter: Payer: Self-pay | Admitting: Urology

## 2020-01-16 ENCOUNTER — Ambulatory Visit (INDEPENDENT_AMBULATORY_CARE_PROVIDER_SITE_OTHER): Payer: Medicare Other | Admitting: Urology

## 2020-01-16 ENCOUNTER — Other Ambulatory Visit: Payer: Self-pay

## 2020-01-16 VITALS — BP 151/84 | HR 96 | Ht 67.0 in | Wt 192.0 lb

## 2020-01-16 DIAGNOSIS — N401 Enlarged prostate with lower urinary tract symptoms: Secondary | ICD-10-CM | POA: Diagnosis not present

## 2020-01-16 LAB — BLADDER SCAN AMB NON-IMAGING: Scan Result: 0

## 2020-01-16 NOTE — Progress Notes (Signed)
01/16/2020 1:07 PM   David Frey 1930-04-15 [id number]  Referring provider: Patrice Paradise, MD [address] [facility]  [place] [address]  Chief Complaint  Patient presents with  . Medication Refill    Urologic history: 1.  BPH with lower urinary tract symptoms -On tamsulosin/Toviaz -TURP 1985 -Cystoscopy 11/2017 TUR changes with mild adenoma regrowth  2.  Microhematuria -Renal ultrasound with simple cyst -Cystoscopy as above.  Hypervascularity prostate   HPI: 86 y.o. male presents for annual follow-up.  Since his visit last year he has noted some splitting of his urinary stream.  He states this is mild and nonbothersome.  Denies dysuria, gross hematuria.  Otherwise his voiding pattern is stable.   Remains on tamsulosin and Toviaz.  PMH: Past Medical History:  Diagnosis Date  . Actinic keratosis   . Arthritis, degenerative 10/21/2015  . Asthma without status asthmaticus 12/13/2014  . Atrial fibrillation (HCC)   . Benign fibroma of prostate 12/13/2014  . Benign prostatic hyperplasia with urinary obstruction 10/30/2012  . Blood glucose elevated 11/28/2014  . BP (high blood pressure) 12/13/2014  . Chronic obstructive pulmonary disease (HCC) 12/13/2014  . Chronic systolic heart failure (HCC) 07/09/2014   Overview:  Global ef 45%   . Combined fat and carbohydrate induced hyperlipemia 04/01/2014  . Edema leg 04/01/2014  . Excessive urination at night 10/30/2012  . Gout 12/13/2014  . Hypertension   . Impaired renal function 12/13/2014  . 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 Methicillin susceptible Staphylococcus aureus infection 02/25/2010   Overview:  Methicillin Susceptible Staphylococcus Aureus Infection   . MI (mitral incompetence) 07/09/2014  . Obstruction to urinary outflow 12/13/2014  . Paroxysmal atrial fibrillation  (HCC) 07/09/2014    Surgical History: Past Surgical History:  Procedure Laterality Date  . APPENDECTOMY  1943  . HERNIA REPAIR  1993  . PACEMAKER LEADLESS INSERTION N/A 12/18/2018   Procedure: PACEMAKER LEADLESS INSERTION;  Surgeon: Marcina Millard, MD;  Location: [facility] INVASIVE CV LAB;  Service: Cardiovascular;  Laterality: N/A;  . TONSILLECTOMY  1966  . TOTAL KNEE ARTHROPLASTY  2007  . TRANSURETHRAL RESECTION OF PROSTATE  1985    Home Medications:  Allergies as of 01/16/2020      Reactions   Celecoxib Rash, Other (See Comments)   Hallucination      Medication List       Accurate as of January 16, 2020  1:07 PM. If you have any questions, ask your nurse or doctor.        amLODipine 5 MG tablet Commonly known as: NORVASC Take 5 mg by mouth daily.   calcium carbonate 1500 (600 Ca) MG Tabs tablet Commonly known as: OSCAL Take by mouth.   Combigan 0.2-0.5 % ophthalmic solution Generic drug: brimonidine-timolol Place 1 drop into the left eye 2 (two) times daily.   diclofenac Sodium 1 % Gel Commonly known as: VOLTAREN   docusate sodium 100 MG capsule Commonly known as: COLACE Take 100 mg by mouth 2 (two) times daily as needed (constipation).   Eliquis 5 MG Tabs tablet Generic drug: apixaban Take 5 mg by mouth 2 (two) times daily.   fesoterodine 4 MG Tb24 tablet Commonly known as: Toviaz Take 1 tablet (4 mg total) by mouth daily.   latanoprost 0.005 % ophthalmic solution Commonly known as: XALATAN Place 1 drop into both eyes at bedtime.  PREVAGEN PO Take by mouth.   tamsulosin 0.4 MG Caps capsule Commonly known as: FLOMAX Take 1 capsule (0.4 mg total) by mouth daily.       Allergies:  Allergies  Allergen Reactions  . Celecoxib Rash and Other (See Comments)    Hallucination    Family History: Family History  Problem Relation Age of Onset  . Bladder Cancer Neg Hx   . Prostate cancer Neg Hx   . Kidney cancer Neg Hx     Social History:   reports that he has never smoked. He has never used smokeless tobacco. He reports that he does not drink alcohol or use drugs.   Physical Exam: BP (!) 151/84   Pulse 96   Ht 5\' 7"  (1.702 m)   Wt 192 lb (87.1 kg)   BMI 30.07 kg/m   Constitutional:  Alert and oriented, No acute distress. HEENT: [place] AT, moist mucus membranes.  Trachea midline, no masses. Cardiovascular: No clubbing, cyanosis, or edema. Respiratory: Normal respiratory effort, no increased work of breathing. GU: Phallus uncircumcised, foreskin easily retracts, meatus normal. Skin: No rashes, bruises or suspicious lesions. Neurologic: Grossly intact, no focal deficits, moving all 4 extremities. Psychiatric: Normal mood and affect.   Assessment & Plan:    1. Benign prostatic hyperplasia with LUTS Stable lower urinary tract symptoms.  Continue tamsulosin/Toviaz.  PVR by bladder scan was 0 mL.  Continue annual follow-up.   Abbie Sons, [place] [address] [place], [place] [address] [phone]

## 2020-02-21 ENCOUNTER — Other Ambulatory Visit: Payer: Self-pay

## 2020-02-21 ENCOUNTER — Ambulatory Visit (INDEPENDENT_AMBULATORY_CARE_PROVIDER_SITE_OTHER): Payer: Medicare Other | Admitting: Dermatology

## 2020-02-21 DIAGNOSIS — L578 Other skin changes due to chronic exposure to nonionizing radiation: Secondary | ICD-10-CM | POA: Diagnosis not present

## 2020-02-21 DIAGNOSIS — D692 Other nonthrombocytopenic purpura: Secondary | ICD-10-CM | POA: Diagnosis not present

## 2020-02-21 DIAGNOSIS — L820 Inflamed seborrheic keratosis: Secondary | ICD-10-CM

## 2020-02-21 DIAGNOSIS — L570 Actinic keratosis: Secondary | ICD-10-CM | POA: Diagnosis not present

## 2020-02-21 DIAGNOSIS — L821 Other seborrheic keratosis: Secondary | ICD-10-CM

## 2020-02-21 NOTE — Patient Instructions (Signed)
Cryotherapy Aftercare  . Wash gently with soap and water everyday.   . Apply Vaseline and Band-Aid daily until healed. Recommend daily broad spectrum sunscreen SPF 30+ to sun-exposed areas, reapply every 2 hours as needed. Call for new or changing lesions.  

## 2020-02-21 NOTE — Progress Notes (Signed)
   Follow-Up Visit   Subjective  David Frey is a 86 y.o. male who presents for the following: Follow-up.  Patient here today for 4 month follow up. He had AK's at the nose and R upper lip treated with LN2. He also had many ISK's treated at the back, arms and hands. Patient advises there is still a spot at the left nose, one at the chest and one at right lower leg he would like checked. The one at left nose may have been treated prior.  The following portions of the chart were reviewed this encounter and updated as appropriate:  Tobacco  Allergies  Meds  Problems  Med Hx  Surg Hx  Fam Hx     Review of Systems:  No other skin or systemic complaints except as noted in HPI or Assessment and Plan.  Objective  Well appearing patient in no apparent distress; mood and affect are within normal limits.  A focused examination was performed including face, legs, chest, hands, back. Relevant physical exam findings are noted in the Assessment and Plan.  Objective  Right Upper Lip x 1: Erythematous thin papules/macules with gritty scale.   Objective  Left nose x 1, R medial lower leg x 1, mid back x 1 (3): Erythematous keratotic or waxy stuck-on papule or plaque.  0.3 x 0.5cm at left nose  Objective  Hands: Violaceous macules and patches.    Assessment & Plan    AK (actinic keratosis) Right Upper Lip x 1  Destruction of lesion - Right Upper Lip x 1 Complexity: simple   Destruction method: cryotherapy   Informed consent: discussed and consent obtained   Timeout:  patient name, date of birth, surgical site, and procedure verified Lesion destroyed using liquid nitrogen: Yes   Region frozen until ice ball extended beyond lesion: Yes   Outcome: patient tolerated procedure well with no complications   Post-procedure details: wound care instructions given    Inflamed seborrheic keratosis (3) Left nose x 1, R medial lower leg x 1, mid back x 1  Destruction of lesion - Left  nose x 1, R medial lower leg x 1, mid back x 1 Complexity: simple   Destruction method: cryotherapy   Informed consent: discussed and consent obtained   Timeout:  patient name, date of birth, surgical site, and procedure verified Lesion destroyed using liquid nitrogen: Yes   Region frozen until ice ball extended beyond lesion: Yes   Outcome: patient tolerated procedure well with no complications   Post-procedure details: wound care instructions given    Senile purpura (HCC) Hands  Benign, observe.     Seborrheic Keratoses - Stuck-on, waxy, tan-brown papules and plaques chest - Discussed benign etiology and prognosis. - Observe - Call for any changes  Actinic Damage - diffuse scaly erythematous macules with underlying dyspigmentation - Recommend daily broad spectrum sunscreen SPF 30+ to sun-exposed areas, reapply every 2 hours as needed.  - Call for new or changing lesions.   Return for AK FOLLOW UP 2-3 months.  Anise Salvo, RMA, am acting as scribe for Armida Sans, MD . Documentation: I have reviewed the above documentation for accuracy and completeness, and I agree with the above.  Armida Sans, MD

## 2020-02-29 ENCOUNTER — Encounter: Payer: Self-pay | Admitting: Dermatology

## 2020-03-13 ENCOUNTER — Other Ambulatory Visit: Payer: Self-pay

## 2020-03-13 ENCOUNTER — Inpatient Hospital Stay
Admission: EM | Admit: 2020-03-13 | Discharge: 2020-03-17 | DRG: 378 | Disposition: A | Discharge status: Home / Self Care | Payer: Medicare Other | Attending: Internal Medicine | Admitting: Internal Medicine

## 2020-03-13 DIAGNOSIS — I5042 Chronic combined systolic (congestive) and diastolic (congestive) heart failure: Secondary | ICD-10-CM | POA: Diagnosis present

## 2020-03-13 DIAGNOSIS — I130 Hypertensive heart and chronic kidney disease with heart failure and stage 1 through stage 4 chronic kidney disease, or unspecified chronic kidney disease: Secondary | ICD-10-CM | POA: Diagnosis present

## 2020-03-13 DIAGNOSIS — K921 Melena: Secondary | ICD-10-CM

## 2020-03-13 DIAGNOSIS — N179 Acute kidney failure, unspecified: Secondary | ICD-10-CM | POA: Diagnosis not present

## 2020-03-13 DIAGNOSIS — I2510 Atherosclerotic heart disease of native coronary artery without angina pectoris: Secondary | ICD-10-CM | POA: Diagnosis present

## 2020-03-13 DIAGNOSIS — I5032 Chronic diastolic (congestive) heart failure: Secondary | ICD-10-CM

## 2020-03-13 DIAGNOSIS — K640 First degree hemorrhoids: Secondary | ICD-10-CM | POA: Diagnosis present

## 2020-03-13 DIAGNOSIS — N401 Enlarged prostate with lower urinary tract symptoms: Secondary | ICD-10-CM | POA: Diagnosis present

## 2020-03-13 DIAGNOSIS — K922 Gastrointestinal hemorrhage, unspecified: Secondary | ICD-10-CM | POA: Diagnosis present

## 2020-03-13 DIAGNOSIS — K5731 Diverticulosis of large intestine without perforation or abscess with bleeding: Secondary | ICD-10-CM | POA: Diagnosis not present

## 2020-03-13 DIAGNOSIS — Z96659 Presence of unspecified artificial knee joint: Secondary | ICD-10-CM | POA: Diagnosis present

## 2020-03-13 DIAGNOSIS — Z20822 Contact with and (suspected) exposure to covid-19: Secondary | ICD-10-CM | POA: Diagnosis present

## 2020-03-13 DIAGNOSIS — J449 Chronic obstructive pulmonary disease, unspecified: Secondary | ICD-10-CM | POA: Diagnosis present

## 2020-03-13 DIAGNOSIS — I10 Essential (primary) hypertension: Secondary | ICD-10-CM | POA: Diagnosis not present

## 2020-03-13 DIAGNOSIS — K625 Hemorrhage of anus and rectum: Secondary | ICD-10-CM | POA: Diagnosis not present

## 2020-03-13 DIAGNOSIS — Z7901 Long term (current) use of anticoagulants: Secondary | ICD-10-CM

## 2020-03-13 DIAGNOSIS — N1831 Chronic kidney disease, stage 3a: Secondary | ICD-10-CM | POA: Diagnosis present

## 2020-03-13 DIAGNOSIS — Z79899 Other long term (current) drug therapy: Secondary | ICD-10-CM

## 2020-03-13 DIAGNOSIS — Z950 Presence of cardiac pacemaker: Secondary | ICD-10-CM

## 2020-03-13 DIAGNOSIS — N400 Enlarged prostate without lower urinary tract symptoms: Secondary | ICD-10-CM | POA: Diagnosis present

## 2020-03-13 DIAGNOSIS — E785 Hyperlipidemia, unspecified: Secondary | ICD-10-CM | POA: Diagnosis present

## 2020-03-13 DIAGNOSIS — K449 Diaphragmatic hernia without obstruction or gangrene: Secondary | ICD-10-CM | POA: Diagnosis present

## 2020-03-13 DIAGNOSIS — D62 Acute posthemorrhagic anemia: Secondary | ICD-10-CM | POA: Diagnosis present

## 2020-03-13 DIAGNOSIS — I482 Chronic atrial fibrillation, unspecified: Secondary | ICD-10-CM | POA: Diagnosis present

## 2020-03-13 DIAGNOSIS — I480 Paroxysmal atrial fibrillation: Secondary | ICD-10-CM | POA: Diagnosis present

## 2020-03-13 LAB — CBC
HCT: 44.2 % (ref 39.0–52.0)
HCT: 43.8 % (ref 39.0–52.0)
HCT: 39.2 % (ref 39.0–52.0)
Hemoglobin: 14.9 g/dL (ref 13.0–17.0)
Hemoglobin: 14.7 g/dL (ref 13.0–17.0)
Hemoglobin: 13.1 g/dL (ref 13.0–17.0)
MCH: 33.8 pg (ref 26.0–34.0)
MCH: 33.9 pg (ref 26.0–34.0)
MCH: 33.5 pg (ref 26.0–34.0)
MCHC: 33.7 g/dL (ref 30.0–36.0)
MCHC: 33.6 g/dL (ref 30.0–36.0)
MCHC: 33.4 g/dL (ref 30.0–36.0)
MCV: 100.2 fL — ABNORMAL HIGH (ref 80.0–100.0)
MCV: 100.9 fL — ABNORMAL HIGH (ref 80.0–100.0)
MCV: 100.3 fL — ABNORMAL HIGH (ref 80.0–100.0)
Platelets: 141 10*3/uL — ABNORMAL LOW (ref 150–400)
Platelets: 150 10*3/uL (ref 150–400)
Platelets: 124 10*3/uL — ABNORMAL LOW (ref 150–400)
RBC: 4.41 MIL/uL (ref 4.22–5.81)
RBC: 4.34 MIL/uL (ref 4.22–5.81)
RBC: 3.91 MIL/uL — ABNORMAL LOW (ref 4.22–5.81)
RDW: 13.7 % (ref 11.5–15.5)
RDW: 13.8 % (ref 11.5–15.5)
RDW: 13.9 % (ref 11.5–15.5)
WBC: 4.7 10*3/uL (ref 4.0–10.5)
WBC: 8.1 10*3/uL (ref 4.0–10.5)
WBC: 6.8 10*3/uL (ref 4.0–10.5)
nRBC: 0 % (ref 0.0–0.2)
nRBC: 0 % (ref 0.0–0.2)
nRBC: 0 % (ref 0.0–0.2)

## 2020-03-13 LAB — COMPREHENSIVE METABOLIC PANEL
ALT: 13 U/L (ref 0–44)
AST: 22 U/L (ref 15–41)
Albumin: 3.8 g/dL (ref 3.5–5.0)
Alkaline Phosphatase: 53 U/L (ref 38–126)
Anion gap: 8 (ref 5–15)
BUN: 28 mg/dL — ABNORMAL HIGH (ref 8–23)
CO2: 26 mmol/L (ref 22–32)
Calcium: 9.2 mg/dL (ref 8.9–10.3)
Chloride: 107 mmol/L (ref 98–111)
Creatinine, Ser: 1.38 mg/dL — ABNORMAL HIGH (ref 0.61–1.24)
GFR calc Af Amer: 53 mL/min — ABNORMAL LOW (ref >60)
GFR calc non Af Amer: 45 mL/min — ABNORMAL LOW (ref >60)
Glucose, Bld: 146 mg/dL — ABNORMAL HIGH (ref 70–99)
Potassium: 5 mmol/L (ref 3.5–5.1)
Sodium: 141 mmol/L (ref 135–145)
Total Bilirubin: 1.2 mg/dL (ref 0.3–1.2)
Total Protein: 7.2 g/dL (ref 6.5–8.1)

## 2020-03-13 LAB — PROTIME-INR
INR: 1.3 — ABNORMAL HIGH (ref 0.8–1.2)
Prothrombin Time: 15.8 seconds — ABNORMAL HIGH (ref 11.4–15.2)

## 2020-03-13 LAB — TYPE AND SCREEN
ABO/RH(D): O POS
Antibody Screen: NEGATIVE

## 2020-03-13 LAB — GLUCOSE, CAPILLARY: Glucose-Capillary: 176 mg/dL — ABNORMAL HIGH (ref 70–99)

## 2020-03-13 LAB — SARS CORONAVIRUS 2 BY RT PCR (HOSPITAL ORDER, PERFORMED IN ~~LOC~~ HOSPITAL LAB): SARS Coronavirus 2: NEGATIVE

## 2020-03-13 LAB — BRAIN NATRIURETIC PEPTIDE: B Natriuretic Peptide: 73.1 pg/mL (ref 0.0–100.0)

## 2020-03-13 MED ORDER — ACETAMINOPHEN 325 MG PO TABS: 650.0000 mg | ORAL_TABLET | Freq: Four times a day (QID) | ORAL | Status: DC | PRN

## 2020-03-13 MED ORDER — LATANOPROST 0.005 % OP SOLN
1.0000 [drp] | Freq: Every day | OPHTHALMIC | Status: DC
  Administered 2020-03-13 – 2020-03-16 (×4): 1 [drp] via OPHTHALMIC
  Filled 2020-03-13: qty 2.5

## 2020-03-13 MED ORDER — FESOTERODINE FUMARATE ER 4 MG PO TB24
4.0000 mg | ORAL_TABLET | Freq: Every day | ORAL | Status: DC
  Administered 2020-03-14 – 2020-03-17 (×4): 4 mg via ORAL
  Filled 2020-03-13 (×4): qty 1

## 2020-03-13 MED ORDER — DOCUSATE SODIUM 100 MG PO CAPS: 100.0000 mg | ORAL_CAPSULE | Freq: Every day | ORAL | Status: DC | PRN

## 2020-03-13 MED ORDER — AMLODIPINE BESYLATE 5 MG PO TABS
5.0000 mg | ORAL_TABLET | Freq: Every day | ORAL | Status: DC
  Administered 2020-03-14 – 2020-03-17 (×4): 5 mg via ORAL
  Filled 2020-03-13 (×4): qty 1

## 2020-03-13 MED ORDER — SODIUM CHLORIDE 0.9 % IV SOLN: INTRAVENOUS | Status: DC

## 2020-03-13 MED ORDER — BRIMONIDINE TARTRATE-TIMOLOL 0.2-0.5 % OP SOLN
1.0000 [drp] | Freq: Two times a day (BID) | OPHTHALMIC | Status: DC
  Filled 2020-03-13: qty 5

## 2020-03-13 MED ORDER — BRIMONIDINE TARTRATE 0.2 % OP SOLN
1.0000 [drp] | Freq: Two times a day (BID) | OPHTHALMIC | Status: DC
  Administered 2020-03-14 – 2020-03-17 (×6): 1 [drp] via OPHTHALMIC
  Filled 2020-03-13: qty 5

## 2020-03-13 MED ORDER — HYDRALAZINE HCL 20 MG/ML IJ SOLN: 5.0000 mg | INTRAMUSCULAR | Status: DC | PRN

## 2020-03-13 MED ORDER — ONDANSETRON HCL 4 MG/2ML IJ SOLN: 4.0000 mg | Freq: Three times a day (TID) | INTRAMUSCULAR | Status: DC | PRN

## 2020-03-13 MED ORDER — CALCIUM CARBONATE 1250 (500 CA) MG PO TABS
1.0000 | ORAL_TABLET | ORAL | Status: DC
  Administered 2020-03-14: 500 mg via ORAL
  Filled 2020-03-13 (×2): qty 1

## 2020-03-13 MED ORDER — TAMSULOSIN HCL 0.4 MG PO CAPS
0.4000 mg | ORAL_CAPSULE | Freq: Every day | ORAL | Status: DC
  Administered 2020-03-14 – 2020-03-17 (×4): 0.4 mg via ORAL
  Filled 2020-03-13 (×4): qty 1

## 2020-03-13 MED ORDER — ALBUTEROL SULFATE (2.5 MG/3ML) 0.083% IN NEBU: 3.0000 mL | INHALATION_SOLUTION | RESPIRATORY_TRACT | Status: DC | PRN

## 2020-03-13 MED ORDER — TIMOLOL MALEATE 0.5 % OP SOLN
1.0000 [drp] | Freq: Two times a day (BID) | OPHTHALMIC | Status: DC
  Administered 2020-03-14 – 2020-03-16 (×4): 1 [drp] via OPHTHALMIC
  Filled 2020-03-13: qty 5

## 2020-03-13 MED ORDER — PANTOPRAZOLE SODIUM 40 MG IV SOLR
40.0000 mg | Freq: Two times a day (BID) | INTRAVENOUS | Status: DC
  Administered 2020-03-13 – 2020-03-16 (×7): 40 mg via INTRAVENOUS
  Filled 2020-03-13 (×6): qty 40

## 2020-03-13 MED ORDER — PANTOPRAZOLE SODIUM 40 MG IV SOLR: 40.0000 mg | Freq: Once | INTRAVENOUS | Status: DC

## 2020-03-13 NOTE — ED Provider Notes (Signed)
[facility] Emergency Department Provider Note  ____________________________________________   None    (approximate)  I have reviewed the triage vital signs and the nursing notes.  History  Chief Complaint GI Bleeding    HPI David Frey is a 86 y.o. male PMHx as below, including atrial fibrillation on anticoagulation, who presents to the emergency department for bloody stools.  Patient reports having at least 3 or 4 bloody stools mixed with blood clots since 2 AM. Had another while in the ER. He denies any associated abdominal pain. No vomiting. Does take 1 pill of naproxen daily. Denies heavy alcohol use. Reports compliance w/ his Eliquis.  Patient reports having a normal colonoscopy around age 47.   Past Medical Hx Past Medical History:  Diagnosis Date  . Actinic keratosis   . Arthritis, degenerative 10/21/2015  . Asthma without status asthmaticus 12/13/2014  . Atrial fibrillation (HCC)   . Benign fibroma of prostate 12/13/2014  . Benign prostatic hyperplasia with urinary obstruction 10/30/2012  . Blood glucose elevated 11/28/2014  . BP (high blood pressure) 12/13/2014  . Chronic obstructive pulmonary disease (HCC) 12/13/2014  . Chronic systolic heart failure (HCC) 07/09/2014   Overview:  Global ef 45%   . Combined fat and carbohydrate induced hyperlipemia 04/01/2014  . Edema leg 04/01/2014  . Excessive urination at night 10/30/2012  . Gout 12/13/2014  . Hypertension   . Impaired renal function 12/13/2014  . 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 Methicillin susceptible Staphylococcus aureus infection 02/25/2010   Overview:  Methicillin Susceptible Staphylococcus Aureus Infection   . MI (mitral incompetence) 07/09/2014  . Obstruction to urinary outflow 12/13/2014  . Paroxysmal atrial fibrillation (HCC) 07/09/2014    Problem  List Patient Active Problem List   Diagnosis Date Noted  . Sick sinus syndrome (HCC) 12/18/2018  . Dizziness 11/14/2017  . Microscopic hematuria 10/12/2017  . Pain of right upper arm 06/15/2017  . Encounter for long-term (current) use of high-risk medication 02/09/2017  . Bradycardia 11/30/2016  . Chronic gouty arthropathy without tophi 11/09/2016  . Arthritis, degenerative 10/21/2015  . Unsteadiness 10/01/2015  . Asthma without status asthmaticus 12/13/2014  . Benign prostatic hyperplasia without lower urinary tract symptoms 12/13/2014  . Chronic obstructive pulmonary disease (HCC) 12/13/2014  . Gout 12/13/2014  . BP (high blood pressure) 12/13/2014  . Obstruction to urinary outflow 12/13/2014  . Impaired renal function 12/13/2014  . Stasis, venous 12/13/2014  . Benign essential HTN 11/28/2014  . Blood glucose elevated 11/28/2014  . Chronic systolic heart failure (HCC) 07/09/2014  . MI (mitral incompetence) 07/09/2014  . Paroxysmal atrial fibrillation (HCC) 07/09/2014  . Edema leg 04/01/2014  . HLD (hyperlipidemia) 04/01/2014  . Combined fat and carbohydrate induced hyperlipemia 04/01/2014  . Breath shortness 04/01/2014  . Benign prostatic hyperplasia with urinary obstruction 10/30/2012  . Excessive urination at night 10/30/2012  . FOM (frequency of micturition) 10/30/2012  . Inflammation of joint of finger 07/11/2012  . Methicillin susceptible Staphylococcus aureus infection 02/25/2010  . 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, implant, and graft 05/13/2007    Past Surgical Hx Past Surgical History:  Procedure Laterality Date  . APPENDECTOMY  1943  . HERNIA REPAIR  1993  . PACEMAKER LEADLESS INSERTION N/A 12/18/2018   Procedure: PACEMAKER LEADLESS INSERTION;  Surgeon: Marcina Millard, MD;  Location: [facility] INVASIVE CV LAB;  Service: Cardiovascular;  Laterality: N/A;  . TONSILLECTOMY  1966  . TOTAL KNEE ARTHROPLASTY  2007  .  TRANSURETHRAL RESECTION OF PROSTATE  1985    Medications Prior to Admission medications   Medication Sig Start Date End Date Taking? Authorizing Provider  amLODipine (NORVASC) 5 MG tablet Take 5 mg by mouth daily. 12/02/18   [provider]  Apoaequorin (PREVAGEN PO) Take by mouth.    [provider]  calcium carbonate (OSCAL) 1500 (600 Ca) MG TABS tablet Take by mouth.    [provider]  COMBIGAN 0.2-0.5 % ophthalmic solution Place 1 drop into the left eye 2 (two) times daily. 11/14/18   [provider]  diclofenac Sodium (VOLTAREN) 1 % GEL  01/09/20   [provider]  docusate sodium (COLACE) 100 MG capsule Take 100 mg by mouth 2 (two) times daily as needed (constipation).     [provider]  ELIQUIS 5 MG TABS tablet Take 5 mg by mouth 2 (two) times daily. 11/23/18   [provider]  fesoterodine (TOVIAZ) 4 MG TB24 tablet Take 1 tablet (4 mg total) by mouth daily. 12/25/19   Stoioff, Verna Czech, MD  latanoprost (XALATAN) 0.005 % ophthalmic solution Place 1 drop into both eyes at bedtime.     [provider]  tamsulosin (FLOMAX) 0.4 MG CAPS capsule Take 1 capsule (0.4 mg total) by mouth daily. 12/31/19   Riki Altes, MD    Allergies Celecoxib  Family Hx Family History  Problem Relation Age of Onset  . Bladder Cancer Neg Hx   . Prostate cancer Neg Hx   . Kidney cancer Neg Hx     Social Hx Social History   Tobacco Use  . Smoking status: Never Smoker  . Smokeless tobacco: Never Used  Vaping Use  . Vaping Use: Never used  Substance Use Topics  . Alcohol use: No    Alcohol/week: 0.0 standard drinks  . Drug use: No     Review of Systems  Constitutional: Negative for fever. Negative for chills. Eyes: Negative for visual changes. ENT: Negative for sore throat. Cardiovascular: Negative for chest pain. Respiratory: Negative for shortness of breath. Gastrointestinal: + bloody stool Genitourinary: Negative  for dysuria. Musculoskeletal: Negative for leg swelling. Skin: Negative for rash. Neurological: Negative for headaches.   Physical Exam  Vital Signs: ED Triage Vitals  Enc Vitals Group     BP 03/13/20 0808 135/75     Pulse Rate 03/13/20 0808 76     Resp 03/13/20 0808 20     Temp 03/13/20 0808 98.4 F (36.9 C)     Temp Source 03/13/20 0808 Oral     SpO2 03/13/20 0808 95 %     Weight 03/13/20 0808 186 lb (84.4 kg)     Height 03/13/20 0808 5\' 7"  (1.702 m)     Head Circumference --      Peak Flow --      Pain Score 03/13/20 0812 0     Pain Loc --      Pain Edu? --      Excl. in GC? --     Constitutional: Alert and oriented. Well appearing. NAD.  Head: Normocephalic. Atraumatic. Eyes: Conjunctivae clear. Sclera anicteric. Pupils equal and symmetric. Nose: No masses or lesions. No congestion or rhinorrhea. Mouth/Throat: Wearing mask.  Neck: No stridor. Trachea midline.  Cardiovascular: Normal rate, regular rhythm. Extremities well perfused. Respiratory: Normal respiratory effort.  Lungs CTAB. Gastrointestinal: Soft. Non-distended. Non-tender.  Umbilical hernia. Genitourinary: RN chaperone present.  No fissures or hemorrhoids.  Grossly bloody stool, immediately guaiac positive. Musculoskeletal: No lower extremity edema. No deformities. Neurologic:  Normal speech and language. No gross focal or lateralizing neurologic deficits are appreciated.  Skin: Skin is warm, dry and intact. No rash noted. Psychiatric: Mood and affect are appropriate for situation.   Procedures  Procedure(s) performed (including critical care):  Procedures   Initial Impression / Assessment and Plan / MDM / ED Course  86 y.o. male who presents to the ED for blood stools. On anticoagulation for AF.  On exam he has grossly bloody stool, immediately guaiac positive.  Ddx: lower GI bleed, diverticular bleed, internal hemorrhoid, anemia. Less likely colitis given no abdominal pain, no diarrhea. Consider  upper GI as he takes a daily naproxen, but would have expected dark black or more melanotic stool.  Will plan for labs, IV PPI. He is HDS.   Initial hemoglobin WNL. Mildly elevated BUN and creatinine. With his ongoing bloody stools in the ER, on anticoagulation, will plan to admit for H&H trending, likely GI consult.  Patient agreeable.  Discussed with hospitalist for admission.   _______________________________   As part of my medical decision making I have reviewed available labs, radiology tests, reviewed old records/performed chart review.    Final Clinical Impression(s) / ED Diagnosis  Bloody stool    Note:  This document was prepared using Dragon voice recognition software and may include unintentional dictation errors.   Lilia Pro., MD 03/13/20 [phone]

## 2020-03-13 NOTE — Consult Note (Signed)
Midge Minium, MD [facility]  [address] [place], [place] [address] Phone: [phone] Fax : [phone]  Consultation  Referring Provider:     Dr.  [place] Cellar Primary Care Physician:  Patrice Paradise, MD Primary Gastroenterologist: Gentry Fitz        Reason for Consultation:     Rectal bleeding  Date of Admission:  03/13/2020 Date of Consultation:  03/13/2020         HPI:   David Frey is a 86 y.o. male gentleman who came in with a history of A. fib on Eliquis.  The patient states that he took his last dose of Eliquis this morning.  He states that he has had 3-4 bloody stools that woke him up in the middle the night to have a bowel movement.  The patient reports that the stools are burgundy in color with some areas more bright red and other areas dark without any black tarry stools.  The patient reports that he takes Advil for joint pains. When I came to see the patient in the emergency department he was sitting in a chair in his close without any complaints.  He was denying any abdominal pain fevers chills nausea or vomiting.  He reports that his last colonoscopy was many years ago.  He also denies any other episodes of GI bleeding in the past.  The patient's trending of his hemoglobin and hematocrit including his previous blood work done in March 2020 showed:  Component     Latest Ref Rng & Units 12/19/2018 03/13/2020 03/13/2020          8:14 AM  4:14 PM  Hemoglobin     13.0 - 17.0 g/dL 03.5 00.9 38.1  HCT     39 - 52 % 42.7 44.2 43.8   The patient also reports that he has had some heartburn and upper abdominal pain that he had not reported to his wife before today.  The patient reports that he was worried about the rectal bleeding therefore came to the emergency department.   Past Medical History:  Diagnosis Date  . Actinic keratosis   . Arthritis, degenerative 10/21/2015  . Asthma without status asthmaticus 12/13/2014  . Atrial fibrillation (HCC)   . Benign fibroma  of prostate 12/13/2014  . Benign prostatic hyperplasia with urinary obstruction 10/30/2012  . Blood glucose elevated 11/28/2014  . BP (high blood pressure) 12/13/2014  . Chronic obstructive pulmonary disease (HCC) 12/13/2014  . Chronic systolic heart failure (HCC) 07/09/2014   Overview:  Global ef 45%   . Combined fat and carbohydrate induced hyperlipemia 04/01/2014  . Edema leg 04/01/2014  . Excessive urination at night 10/30/2012  . Gout 12/13/2014  . Hypertension   . Impaired renal function 12/13/2014  . 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 Methicillin susceptible Staphylococcus aureus infection 02/25/2010   Overview:  Methicillin Susceptible Staphylococcus Aureus Infection   . MI (mitral incompetence) 07/09/2014  . Obstruction to urinary outflow 12/13/2014  . Paroxysmal atrial fibrillation (HCC) 07/09/2014    Past Surgical History:  Procedure Laterality Date  . APPENDECTOMY  1943  . HERNIA REPAIR  1993  . PACEMAKER LEADLESS INSERTION N/A 12/18/2018   Procedure: PACEMAKER LEADLESS INSERTION;  Surgeon: Marcina Millard, MD;  Location: [facility] INVASIVE CV LAB;  Service: Cardiovascular;  Laterality: N/A;  . TONSILLECTOMY  1966  . TOTAL KNEE ARTHROPLASTY  2007  .  TRANSURETHRAL RESECTION OF PROSTATE  1985    Prior to Admission medications   Medication Sig Start Date End Date Taking? Authorizing Provider  amLODipine (NORVASC) 5 MG tablet Take 5 mg by mouth daily. 12/02/18  Yes [provider]  Apoaequorin (PREVAGEN PO) Take by mouth.   Yes [provider]  calcium carbonate (OSCAL) 1500 (600 Ca) MG TABS tablet Take 1 tablet by mouth. Takes 3 times a week (Monday, Wednesday, Saturday)   Yes [provider]  COMBIGAN 0.2-0.5 % ophthalmic solution Place 1 drop into the left eye 2 (two) times daily. 11/14/18  Yes [provider]  docusate sodium  (COLACE) 100 MG capsule Take 100 mg by mouth 3 (three) times a week. Tuesday, Thursday, Saturday   Yes [provider]  ELIQUIS 5 MG TABS tablet Take 5 mg by mouth 2 (two) times daily. 11/23/18  Yes [provider]  fesoterodine (TOVIAZ) 4 MG TB24 tablet Take 1 tablet (4 mg total) by mouth daily. 12/25/19  Yes Stoioff, Ronda Fairly, MD  latanoprost (XALATAN) 0.005 % ophthalmic solution Place 1 drop into both eyes at bedtime.    Yes [provider]  tamsulosin (FLOMAX) 0.4 MG CAPS capsule Take 1 capsule (0.4 mg total) by mouth daily. 12/31/19  Yes Stoioff, Ronda Fairly, MD    Family History  Problem Relation Age of Onset  . Bladder Cancer Neg Hx   . Prostate cancer Neg Hx   . Kidney cancer Neg Hx      Social History   Tobacco Use  . Smoking status: Never Smoker  . Smokeless tobacco: Never Used  Vaping Use  . Vaping Use: Never used  Substance Use Topics  . Alcohol use: No    Alcohol/week: 0.0 standard drinks  . Drug use: No    Allergies as of 03/13/2020 - Review Complete 03/13/2020  Allergen Reaction Noted  . Celecoxib Rash and Other (See Comments) 11/26/2013    Review of Systems:    All systems reviewed and negative except where noted in HPI.   Physical Exam:  Vital signs in last 24 hours: Temp:  [98 F (36.7 C)-98.4 F (36.9 C)] 98 F (36.7 C) (06/17 1757) Pulse Rate:  [58-76] 58 (06/17 1757) Resp:  [18-20] 18 (06/17 1757) BP: (133-145)/(75-85) 145/85 (06/17 1757) SpO2:  [75 %-100 %] 75 % (06/17 1757) Weight:  [84.4 kg] 84.4 kg (06/17 0808)   General:   Pleasant, cooperative in NAD Head:  Normocephalic and atraumatic. Eyes:   No icterus.   Conjunctiva pink. PERRLA. Ears:  Normal auditory acuity. Neck:  Supple; no masses or thyroidomegaly Lungs: Respirations even and unlabored. Lungs clear to auscultation bilaterally.   No wheezes, crackles, or rhonchi.  Heart:  Regular rate and rhythm;  Without murmur, clicks, rubs or gallops Abdomen:  Soft,  nondistended, nontender. Normal bowel sounds. No appreciable masses or hepatomegaly.  No rebound or guarding.  Rectal:  Not performed. Msk:  Symmetrical without gross deformities.    Extremities:  Without edema, cyanosis or clubbing. Neurologic:  Alert and oriented x3;  grossly normal neurologically. Skin:  Intact without significant lesions or rashes. Cervical Nodes:  No significant cervical adenopathy. Psych:  Alert and cooperative. Normal affect.  LAB RESULTS: Recent Labs    03/13/20 0814 03/13/20 1614  WBC 4.7 8.1  HGB 14.9 14.7  HCT 44.2 43.8  PLT 141* 150   BMET Recent Labs    03/13/20 0814  NA 141  K 5.0  CL 107  CO2 26  GLUCOSE 146*  BUN 28*  CREATININE 1.38*  CALCIUM 9.2   LFT Recent Labs    03/13/20 0814  PROT 7.2  ALBUMIN 3.8  AST 22  ALT 13  ALKPHOS 53  BILITOT 1.2   PT/INR No results for input(s): LABPROT, INR in the last 72 hours.  STUDIES: No results found.    Impression / Plan:   Assessment: Principal Problem:   Rectal bleeding Active Problems:   Benign essential HTN   Benign prostatic hyperplasia without lower urinary tract symptoms   Chronic obstructive pulmonary disease (HCC)   Chronic diastolic CHF (congestive heart failure) (HCC)   Paroxysmal atrial fibrillation (HCC)   Acute renal failure superimposed on stage 3a chronic kidney disease (HCC)   David Frey is a 86 y.o. y/o male with a history of A. fib on Eliquis with COPD and congestive heart failure.  The patient's last Eliquis dose was this morning.  His hemoglobin and hematocrit have been stable.  The patient has had multiple bowel movements with bright red blood per rectum.  He is not sure if he had diverticulosis on his previous colonoscopy.  Plan:  This patient likely has a hemorrhoidal versus a diverticular bleed.  I would favor more of a diverticular bleed since there is darker stool and maroon stools with some clots.  A colonic neoplasm can also present this way.   The patient has been told that he will need an upper endoscopy and colonoscopy due to his chronic epigastric discomfort and his rectal bleeding.  The patient will need to be off his Eliquis for 2 to 3 days therefore the earliest he can have his colonoscopy and EGD would be in 2 days from now.  Continue serial CBCs and transfuse PRN Avoid NSAIDs Maintain 2 large-bore IV lines Please page GI with any acute hemodynamic changes, or signs of active GI bleeding.  If the patient should have a repeat lower GI bleed prior to having his luminal investigation then a red tagged bleeding scan would be advised to try and localize the source of bleeding thereby guiding the colonoscopy.  The patient and his wife have been explained the plan and agree with it.  Thank you for involving me in the care of this patient.      LOS: 0 days   Midge Minium, MD  03/13/2020, 5:58 PM Pager [phone] 7am-5pm  Check AMION for 5pm -7am coverage and on weekends   Note: This dictation was prepared with Dragon dictation along with smaller phrase technology. Any transcriptional errors that result from this process are unintentional.

## 2020-03-13 NOTE — ED Triage Notes (Signed)
Pt c/o having 3-4 blooding stools with clots since 2am. Denies any abd pain. States he does take elaquis.Marland Kitchen pt is in NAD.

## 2020-03-13 NOTE — ED Notes (Signed)
Called wife and informed of what room pt is going to.

## 2020-03-13 NOTE — H&P (Signed)
History and Physical    David Frey GOT:[id number] DOB: [birth year] DOA: 03/13/2020  Referring MD/NP/PA:   PCP: David Paradise, MD   Patient coming from:  The patient is coming from home.  At baseline, pt is independent for most of ADL.        Chief Complaint: Rectal bleeding  HPI: David Frey is a 86 y.o. male with medical history significant of atrial fibrillation on Eliquis, hypertension, COPD, asthma, gout, CKD-3, dCHF, BPH, pacemaker placement, who presents with rectal bleeding.  Patient reports having 3-4 bloody stools mixed with blood clots since 2 AM.  Patient does not have chest pain, shortness of breath, cough, fever or chills.  Denies nausea vomiting, abdominal pain.  No symptoms of UTI.  No dizziness or lightheadedness. He takes 1 pill of naproxen daily. Denies heavy alcohol use.    ED Course: pt was found to have hemoglobin 14.9, positive FOBT, pending COVID-19 PCR, WBC 4.7, worsening renal function, temperature normal, blood pressure 135/75, heart rate 76, RR 20, oxygen saturation 95% on room air.  Patient is placed on MedSurg bed for observation.  Dr. Servando Snare of GI is consulted.  Review of Systems:   General: no fevers, chills, no body weight gain, fatigue HEENT: no blurry vision, hearing changes or sore throat Respiratory: no dyspnea, coughing, wheezing CV: no chest pain, no palpitations GI: no nausea, vomiting, abdominal pain, diarrhea, constipation. Has rectal bleeding GU: no dysuria, burning on urination, increased urinary frequency, hematuria  Ext: has trace leg edema Neuro: no unilateral weakness, numbness, or tingling, no vision change or hearing loss Skin: no rash, no skin tear. MSK: No muscle spasm, no deformity, no limitation of range of movement in spin Heme: No easy bruising.  Travel history: No recent long distant travel.  Allergy:  Allergies  Allergen Reactions  . Celecoxib Rash and Other (See Comments)    Hallucination    Past  Medical History:  Diagnosis Date  . Actinic keratosis   . Arthritis, degenerative 10/21/2015  . Asthma without status asthmaticus 12/13/2014  . Atrial fibrillation (HCC)   . Benign fibroma of prostate 12/13/2014  . Benign prostatic hyperplasia with urinary obstruction 10/30/2012  . Blood glucose elevated 11/28/2014  . BP (high blood pressure) 12/13/2014  . Chronic obstructive pulmonary disease (HCC) 12/13/2014  . Chronic systolic heart failure (HCC) 07/09/2014   Overview:  Global ef 45%   . Combined fat and carbohydrate induced hyperlipemia 04/01/2014  . Edema leg 04/01/2014  . Excessive urination at night 10/30/2012  . Gout 12/13/2014  . Hypertension   . Impaired renal function 12/13/2014  . 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 Methicillin susceptible Staphylococcus aureus infection 02/25/2010   Overview:  Methicillin Susceptible Staphylococcus Aureus Infection   . MI (mitral incompetence) 07/09/2014  . Obstruction to urinary outflow 12/13/2014  . Paroxysmal atrial fibrillation (HCC) 07/09/2014    Past Surgical History:  Procedure Laterality Date  . APPENDECTOMY  1943  . HERNIA REPAIR  1993  . PACEMAKER LEADLESS INSERTION N/A 12/18/2018   Procedure: PACEMAKER LEADLESS INSERTION;  Surgeon: Marcina Millard, MD;  Location: [facility] INVASIVE CV LAB;  Service: Cardiovascular;  Laterality: N/A;  . TONSILLECTOMY  1966  . TOTAL KNEE ARTHROPLASTY  2007  . TRANSURETHRAL RESECTION OF PROSTATE  1985    Social History:  reports that he has never smoked. He has never used smokeless tobacco.  He reports that he does not drink alcohol and does not use drugs.  Family History:  Family History  Problem Relation Age of Onset  . Bladder Cancer Neg Hx   . Prostate cancer Neg Hx   . Kidney cancer Neg Hx      Prior to Admission medications   Medication Sig Start Date End Date Taking?  Authorizing Provider  amLODipine (NORVASC) 5 MG tablet Take 5 mg by mouth daily. 12/02/18   [provider]  Apoaequorin (PREVAGEN PO) Take by mouth.    [provider]  calcium carbonate (OSCAL) 1500 (600 Ca) MG TABS tablet Take by mouth.    [provider]  COMBIGAN 0.2-0.5 % ophthalmic solution Place 1 drop into the left eye 2 (two) times daily. 11/14/18   [provider]  diclofenac Sodium (VOLTAREN) 1 % GEL  01/09/20   [provider]  docusate sodium (COLACE) 100 MG capsule Take 100 mg by mouth 2 (two) times daily as needed (constipation).     [provider]  ELIQUIS 5 MG TABS tablet Take 5 mg by mouth 2 (two) times daily. 11/23/18   [provider]  fesoterodine (TOVIAZ) 4 MG TB24 tablet Take 1 tablet (4 mg total) by mouth daily. 12/25/19   Stoioff, Verna Czech, MD  latanoprost (XALATAN) 0.005 % ophthalmic solution Place 1 drop into both eyes at bedtime.     [provider]  tamsulosin (FLOMAX) 0.4 MG CAPS capsule Take 1 capsule (0.4 mg total) by mouth daily. 12/31/19   Riki Altes, MD    Physical Exam: Vitals:   03/13/20 0808  BP: 135/75  Pulse: 76  Resp: 20  Temp: 98.4 F (36.9 C)  TempSrc: Oral  SpO2: 95%  Weight: 84.4 kg  Height: 5\' 7"  (1.702 m)   General: Not in acute distress HEENT:       Eyes: PERRL, EOMI, no scleral icterus.       ENT: No discharge from the ears and nose, no pharynx injection, no tonsillar enlargement.        Neck: No JVD, no bruit, no mass felt. Heme: No neck lymph node enlargement. Cardiac: S1/S2, RRR, No murmurs, No gallops or rubs. Respiratory: No rales, wheezing, rhonchi or rubs. GI: Soft, nondistended, nontender, no rebound pain, no organomegaly, BS present. GU: No hematuria Ext: has trace leg edema bilaterally. 1+DP/PT pulse bilaterally. Musculoskeletal: No joint deformities, No joint redness or warmth, no limitation of ROM in spin. Skin: No rashes.  Neuro: Alert, oriented  X3, cranial nerves II-XII grossly intact, moves all extremities normally.  Psych: Patient is not psychotic, no suicidal or hemocidal ideation.  Labs on Admission: I have personally reviewed following labs and imaging studies  CBC: Recent Labs  Lab 03/13/20 0814  WBC 4.7  HGB 14.9  HCT 44.2  MCV 100.2*  PLT 141*   Basic Metabolic Panel: Recent Labs  Lab 03/13/20 0814  NA 141  K 5.0  CL 107  CO2 26  GLUCOSE 146*  BUN 28*  CREATININE 1.38*  CALCIUM 9.2   GFR: Estimated Creatinine Clearance: 38.4 mL/min (A) (by C-G formula based on SCr of 1.38 mg/dL (H)). Liver Function Tests: Recent Labs  Lab 03/13/20 0814  AST 22  ALT 13  ALKPHOS 53  BILITOT 1.2  PROT 7.2  ALBUMIN 3.8   No results for input(s): LIPASE, AMYLASE in the last 168 hours. No results for input(s): AMMONIA in the last 168 hours. Coagulation Profile: No results for input(s): INR,  PROTIME in the last 168 hours. Cardiac Enzymes: No results for input(s): CKTOTAL, CKMB, CKMBINDEX, TROPONINI in the last 168 hours. BNP (last 3 results) No results for input(s): PROBNP in the last 8760 hours. HbA1C: No results for input(s): HGBA1C in the last 72 hours. CBG: No results for input(s): GLUCAP in the last 168 hours. Lipid Profile: No results for input(s): CHOL, HDL, LDLCALC, TRIG, CHOLHDL, LDLDIRECT in the last 72 hours. Thyroid Function Tests: No results for input(s): TSH, T4TOTAL, FREET4, T3FREE, THYROIDAB in the last 72 hours. Anemia Panel: No results for input(s): VITAMINB12, FOLATE, FERRITIN, TIBC, IRON, RETICCTPCT in the last 72 hours. Urine analysis:    Component Value Date/Time   COLORURINE Yellow 12/05/2013 0946   APPEARANCEUR Clear 12/08/2017 1035   LABSPEC 1.023 12/05/2013 0946   PHURINE 5.0 12/05/2013 0946   GLUCOSEU Negative 12/08/2017 1035   GLUCOSEU Negative 12/05/2013 0946   HGBUR 2+ 12/05/2013 0946   BILIRUBINUR Negative 12/08/2017 1035   BILIRUBINUR Negative 12/05/2013 0946   KETONESUR  Negative 12/05/2013 0946   PROTEINUR 2+ (A) 12/08/2017 1035   PROTEINUR 30 mg/dL 95/18/8416 6063   NITRITE Negative 12/08/2017 1035   NITRITE Negative 12/05/2013 0946   LEUKOCYTESUR Negative 12/08/2017 1035   LEUKOCYTESUR Negative 12/05/2013 0946   Sepsis Labs: @LABRCNTIP (procalcitonin:4,lacticidven:4) )No results found for this or any previous visit (from the past 240 hour(s)).   Radiological Exams on Admission: No results found.   EKG: Not done in ED, will get one.   Assessment/Plan Principal Problem:   Rectal bleeding Active Problems:   Benign essential HTN   Benign prostatic hyperplasia without lower urinary tract symptoms   Chronic obstructive pulmonary disease (HCC)   Chronic diastolic CHF (congestive heart failure) (HCC)   Paroxysmal atrial fibrillation (HCC)   Acute renal failure superimposed on stage 3a chronic kidney disease (HCC)   Rectal bleeding: pt is on Eliquis, last dose was this morning.  Hemoglobin 14.9.  Patient is asymptomatic. Dr. of GI is consulted.  - will place in med-surg bed obs - GI consulted by Ed, will follow up recommendations - IVF: 75 mL/hr - Start IV pantoprazole 40 mg bid - Zofran IV for nausea - Avoid NSAIDs and SQ heparin - Maintain IV access (2 large bore IVs if possible). - Monitor closely and follow q6h cbc, transfuse as necessary, if Hgb<7.0 - LaB: INR, PTT and type screen  Benign essential HTN: -Amlodipine -IV hydralazine as needed  Benign prostatic hyperplasia without lower urinary tract symptoms -Flomax  Chronic obstructive pulmonary disease (HCC): Stable. -As needed albuterol  Chronic diastolic CHF (congestive heart failure) (HCC): 2D echo on 11/23/2018 showed EF> 55%.  Patient has trace leg edema, but no shortness of breath or respiratory distress.  CHF is compensated. -Check BNP  Paroxysmal atrial fibrillation (HCC): Heart rate is 76.  Patient is not taking node blocker. -Hold Eliquis  Acute renal failure  superimposed on stage 3a chronic kidney disease (HCC): Baseline creatinine 1.01 on 12/19/2018.  His creatinine is 1.38, BUN 28. -Hold naproxen -IV fluid as above -Follow-up renal function by BMP         DVT ppx: SCD Code Status: Partial code (I discussed with the patient in the presence of his wife, and explained the meaning of CODE STATUS, patient wants to be partial code, OK for CPR, but no intubation). Family Communication: Yes, patient's  Wife  at bed side Disposition Plan:  Anticipate discharge back to previous environment Consults called:  Dr. 12/21/2018 Admission status: Med-surg bed for  obs   Status is: Observation  The patient remains OBS appropriate and will d/c before 2 midnights.  Dispo: The patient is from: Home              Anticipated d/c is to: Home              Anticipated d/c date is: 1 day              Patient currently is not medically stable to d/c.          Date of Service 03/13/2020    Ivor Costa [facility]   If 7PM-7AM, please contact night-coverage [email or web address] 03/13/2020, 3:19 PM

## 2020-03-14 DIAGNOSIS — Z20822 Contact with and (suspected) exposure to covid-19: Secondary | ICD-10-CM | POA: Diagnosis present

## 2020-03-14 DIAGNOSIS — K640 First degree hemorrhoids: Secondary | ICD-10-CM | POA: Diagnosis present

## 2020-03-14 DIAGNOSIS — I480 Paroxysmal atrial fibrillation: Secondary | ICD-10-CM | POA: Diagnosis present

## 2020-03-14 DIAGNOSIS — I130 Hypertensive heart and chronic kidney disease with heart failure and stage 1 through stage 4 chronic kidney disease, or unspecified chronic kidney disease: Secondary | ICD-10-CM | POA: Diagnosis present

## 2020-03-14 DIAGNOSIS — D62 Acute posthemorrhagic anemia: Secondary | ICD-10-CM | POA: Diagnosis present

## 2020-03-14 DIAGNOSIS — Z96659 Presence of unspecified artificial knee joint: Secondary | ICD-10-CM | POA: Diagnosis present

## 2020-03-14 DIAGNOSIS — J449 Chronic obstructive pulmonary disease, unspecified: Secondary | ICD-10-CM | POA: Diagnosis present

## 2020-03-14 DIAGNOSIS — I5042 Chronic combined systolic (congestive) and diastolic (congestive) heart failure: Secondary | ICD-10-CM | POA: Diagnosis present

## 2020-03-14 DIAGNOSIS — K5731 Diverticulosis of large intestine without perforation or abscess with bleeding: Secondary | ICD-10-CM | POA: Diagnosis present

## 2020-03-14 DIAGNOSIS — I5032 Chronic diastolic (congestive) heart failure: Secondary | ICD-10-CM | POA: Diagnosis not present

## 2020-03-14 DIAGNOSIS — Z7901 Long term (current) use of anticoagulants: Secondary | ICD-10-CM | POA: Diagnosis not present

## 2020-03-14 DIAGNOSIS — N401 Enlarged prostate with lower urinary tract symptoms: Secondary | ICD-10-CM | POA: Diagnosis present

## 2020-03-14 DIAGNOSIS — K922 Gastrointestinal hemorrhage, unspecified: Secondary | ICD-10-CM | POA: Diagnosis present

## 2020-03-14 DIAGNOSIS — I10 Essential (primary) hypertension: Secondary | ICD-10-CM | POA: Diagnosis not present

## 2020-03-14 DIAGNOSIS — N400 Enlarged prostate without lower urinary tract symptoms: Secondary | ICD-10-CM | POA: Diagnosis not present

## 2020-03-14 DIAGNOSIS — K449 Diaphragmatic hernia without obstruction or gangrene: Secondary | ICD-10-CM | POA: Diagnosis present

## 2020-03-14 DIAGNOSIS — I2510 Atherosclerotic heart disease of native coronary artery without angina pectoris: Secondary | ICD-10-CM | POA: Diagnosis present

## 2020-03-14 DIAGNOSIS — I482 Chronic atrial fibrillation, unspecified: Secondary | ICD-10-CM | POA: Diagnosis present

## 2020-03-14 DIAGNOSIS — Z950 Presence of cardiac pacemaker: Secondary | ICD-10-CM | POA: Diagnosis not present

## 2020-03-14 DIAGNOSIS — N1831 Chronic kidney disease, stage 3a: Secondary | ICD-10-CM | POA: Diagnosis present

## 2020-03-14 DIAGNOSIS — N179 Acute kidney failure, unspecified: Secondary | ICD-10-CM | POA: Diagnosis present

## 2020-03-14 DIAGNOSIS — E785 Hyperlipidemia, unspecified: Secondary | ICD-10-CM | POA: Diagnosis present

## 2020-03-14 DIAGNOSIS — Z79899 Other long term (current) drug therapy: Secondary | ICD-10-CM | POA: Diagnosis not present

## 2020-03-14 DIAGNOSIS — K625 Hemorrhage of anus and rectum: Secondary | ICD-10-CM | POA: Diagnosis present

## 2020-03-14 LAB — CBC
HCT: 34.3 % — ABNORMAL LOW (ref 39.0–52.0)
HCT: 37.4 % — ABNORMAL LOW (ref 39.0–52.0)
Hemoglobin: 11.6 g/dL — ABNORMAL LOW (ref 13.0–17.0)
Hemoglobin: 12.6 g/dL — ABNORMAL LOW (ref 13.0–17.0)
MCH: 34.1 pg — ABNORMAL HIGH (ref 26.0–34.0)
MCH: 33.8 pg (ref 26.0–34.0)
MCHC: 33.8 g/dL (ref 30.0–36.0)
MCHC: 33.7 g/dL (ref 30.0–36.0)
MCV: 100.9 fL — ABNORMAL HIGH (ref 80.0–100.0)
MCV: 100.3 fL — ABNORMAL HIGH (ref 80.0–100.0)
Platelets: 116 10*3/uL — ABNORMAL LOW (ref 150–400)
Platelets: 128 10*3/uL — ABNORMAL LOW (ref 150–400)
RBC: 3.4 MIL/uL — ABNORMAL LOW (ref 4.22–5.81)
RBC: 3.73 MIL/uL — ABNORMAL LOW (ref 4.22–5.81)
RDW: 13.7 % (ref 11.5–15.5)
RDW: 13.8 % (ref 11.5–15.5)
WBC: 5.6 10*3/uL (ref 4.0–10.5)
WBC: 7.8 10*3/uL (ref 4.0–10.5)
nRBC: 0 % (ref 0.0–0.2)
nRBC: 0 % (ref 0.0–0.2)

## 2020-03-14 LAB — GLUCOSE, CAPILLARY
Glucose-Capillary: 96 mg/dL (ref 70–99)
Glucose-Capillary: 121 mg/dL — ABNORMAL HIGH (ref 70–99)
Glucose-Capillary: 120 mg/dL — ABNORMAL HIGH (ref 70–99)
Glucose-Capillary: 110 mg/dL — ABNORMAL HIGH (ref 70–99)

## 2020-03-14 MED ORDER — PEG 3350-KCL-NA BICARB-NACL 420 G PO SOLR
4000.0000 mL | Freq: Once | ORAL | Status: AC
  Administered 2020-03-14: 4000 mL via ORAL
  Filled 2020-03-14: qty 4000

## 2020-03-14 NOTE — H&P (View-Only) (Signed)
Midge Minium, MD [facility]   [address], [address] [place], [place] [address] Phone: [phone] Fax : [phone]   Subjective: The patient reports that he had normal bowel movements yesterday but started to have black stools again last night.  The patient denies any abdominal pain.  His hemoglobin has been stable and is up from yesterday.   Objective: Vital signs in last 24 hours: Vitals:   03/13/20 2351 03/14/20 0412 03/14/20 0751 03/14/20 1203  BP: (!) 104/59 103/65 116/78 100/79  Pulse: 69 69 70 70  Resp: 18 19 18 18   Temp: 97.9 F (36.6 C) 97.8 F (36.6 C) 98.3 F (36.8 C) 98 F (36.7 C)  TempSrc:   Oral Oral  SpO2: 95% 94% 95% 96%  Weight:      Height:       Weight change:   Intake/Output Summary (Last 24 hours) at 03/14/2020 1528 Last data filed at 03/14/2020 1045 Gross per 24 hour  Intake 240 ml  Output --  Net 240 ml     Exam: General: Sitting up in bed in no apparent distress alert and orientated x3   Lab Results: @LABTEST2 @ Micro Results: Recent Results (from the past 240 hour(s))  SARS Coronavirus 2 by RT PCR (hospital order, performed in [facility] hospital lab) Nasopharyngeal Nasopharyngeal Swab     Status: None   Collection Time: 03/13/20  3:13 PM   Specimen: Nasopharyngeal Swab  Result Value Ref Range Status   SARS Coronavirus 2 NEGATIVE NEGATIVE Final    Comment: (NOTE) SARS-CoV-2 target nucleic acids are NOT DETECTED.  The SARS-CoV-2 RNA is generally detectable in upper and lower respiratory specimens during the acute phase of infection. The lowest concentration of SARS-CoV-2 viral copies this assay can detect is 250 copies / mL. A negative result does not preclude SARS-CoV-2 infection and should not be used as the sole basis for treatment or other patient management decisions.  A negative result may occur with improper specimen collection / handling, submission of specimen other than nasopharyngeal swab, presence of viral mutation(s)  within the areas targeted by this assay, and inadequate number of viral copies (<250 copies / mL). A negative result must be combined with clinical observations, patient history, and epidemiological information.  Fact Sheet for Patients:   [facility]  Fact Sheet for Healthcare Providers: 03/15/20  This test is not yet approved or  cleared by the [employer] FDA and has been authorized for detection and/or diagnosis of SARS-CoV-2 by FDA under an Emergency Use Authorization (EUA).  This EUA will remain in effect (meaning this test can be used) for the duration of the COVID-19 declaration under Section 564(b)(1) of the Act, 21 U.S.C. section 360bbb-3(b)(1), unless the authorization is terminated or revoked sooner.  Performed at [facility], [address] [place], [address] [place]    Studies/Results: No results found. Medications: I have reviewed the patient's current medications. Scheduled Meds: . amLODipine  5 mg Oral Daily  . brimonidine  1 drop Left Eye BID   And  . timolol  1 drop Left Eye BID  . calcium carbonate  1 tablet Oral Once per day on Mon Wed Fri  . fesoterodine  4 mg Oral Daily  . latanoprost  1 drop Both Eyes QHS  . pantoprazole (PROTONIX) IV  40 mg Intravenous Q12H  . polyethylene glycol-electrolytes  4,000 mL Oral Once  . tamsulosin  0.4 mg Oral Daily   Continuous Infusions: . sodium chloride 50 mL/hr at 03/14/20 1236  PRN Meds:.acetaminophen, albuterol, docusate sodium, hydrALAZINE, ondansetron (ZOFRAN) IV   Assessment: Principal Problem:   Rectal bleeding Active Problems:   Benign essential HTN   Benign prostatic hyperplasia without lower urinary tract symptoms   Chronic obstructive pulmonary disease (HCC)   Chronic diastolic CHF (congestive heart failure) (HCC)   Paroxysmal atrial fibrillation (HCC)   Acute renal failure superimposed on stage 3a chronic kidney  disease (HCC)   Bloody stool   GI bleed    Plan: This patient came in with dark stools and is off his Eliquis since yesterday morning.  The patient will have an EGD and colonoscopy tomorrow by Dr. Alice Reichert who will be covering for the weekend.  The patient likely has an upper GI bleed but a colonoscopy is being performed since he is already off of his anticoagulation and it should be to hold the anticoagulation for discharge next time.  The patient has been explained the plan and agrees with it.  He will be given a prep tonight for the colonoscopy tomorrow.   LOS: 0 days   Lucilla Lame 03/14/2020, 3:28 PM Pager [phone] 7am-5pm  Check AMION for 5pm -7am coverage and on weekends

## 2020-03-14 NOTE — Progress Notes (Signed)
Patient ID: David Frey, male   DOB: 06/09/1930, 86 y.o.   MRN: [id number]  PROGRESS NOTE    David Frey  NWG:[id number] DOB: [birth year] DOA: 03/13/2020 PCP: Patrice Paradise, MD   Brief Narrative:  86 year old male with history of atrial fibrillation on Eliquis, hypertension, COPD, asthma, gout, chronic kidney disease stage III, chronic diastolic CHF, BPH, pacemaker placement presented with rectal bleeding.  On presentation, hemoglobin was 14.9.  GI was consulted.  Assessment & Plan:   Rectal bleeding -Hemoglobin 14.9 on presentation.  Hemoglobin 12.6 this morning.  Had rectal bleeding again this morning -GI following.  Planning for EGD/colonoscopy probably tomorrow -Monitor H&H.  Continue Protonix.  Transfuse if hemoglobin less than 7.  Benign essential hypertension  -Blood pressure stable.  Continue amlodipine.  BPH -Continue Flomax  COPD Stable.  As needed albuterol  Chronic diastolic CHF -Echo on 11/23/2018 showed EF of more than 55%.  CHF is compensated.  Strict input and output.  Daily weights.  Paroxysmal A. fib -Eliquis on hold.  Rate controlled.  Acute kidney injury on chronic kidney disease stage IIIa -Creatinine 1.38 on presentation.  Baseline creatinine 1.01 on 12/19/2018.  No creatinine today.  Repeat a.m. labs.  Decrease IV fluids to 50 cc an hour   DVT prophylaxis: SCDs Code Status: Partial: Okay for CPR, no intubation Family Communication: None at bedside Disposition Plan: Status is: Observation  The patient will require care spanning > 2 midnights and should be moved to inpatient because: Ongoing diagnostic testing needed not appropriate for outpatient work up.  Still having rectal bleeding.  We will switch to inpatient.  Dispo: The patient is from: Home              Anticipated d/c is to: Home              Anticipated d/c date is: 2 days              Patient currently is not medically stable to d/c.   Consultants: GI  Procedures:  None  Antimicrobials: None   Subjective: Patient seen and examined at bedside.  Denies any abdominal pain, fever or vomiting.  Had rectal bleeding this morning again.  Objective: Vitals:   03/13/20 2011 03/13/20 2351 03/14/20 0412 03/14/20 0751  BP: 128/80 (!) 104/59 103/65 116/78  Pulse: 69 69 69 70  Resp: 19 18 19 18   Temp: 97.9 F (36.6 C) 97.9 F (36.6 C) 97.8 F (36.6 C) 98.3 F (36.8 C)  TempSrc:    Oral  SpO2: 96% 95% 94% 95%  Weight:      Height:        Intake/Output Summary (Last 24 hours) at 03/14/2020 1119 Last data filed at 03/14/2020 1045 Gross per 24 hour  Intake 240 ml  Output --  Net 240 ml   Filed Weights   03/13/20 0808  Weight: 84.4 kg    Examination:  General exam: Appears calm and comfortable.  Elderly male lying in bed.  No distress. Respiratory system: Bilateral decreased breath sounds at bases Cardiovascular system: S1 & S2 heard, Rate controlled Gastrointestinal system: Abdomen is nondistended, soft and nontender. Normal bowel sounds heard. Extremities: No cyanosis, clubbing; trace lower extremity edema present Central nervous system: Alert and oriented. No focal neurological deficits. Moving extremities Skin: No rashes, lesions or ulcers Psychiatry: Judgement and insight appear normal. Mood & affect appropriate.     Data Reviewed: I have personally reviewed following labs and imaging studies  CBC: Recent Labs  Lab 03/13/20 0814 03/13/20 1614 03/13/20 2111 03/14/20 0329 03/14/20 0919  WBC 4.7 8.1 6.8 5.6 7.8  HGB 14.9 14.7 13.1 11.6* 12.6*  HCT 44.2 43.8 39.2 34.3* 37.4*  MCV 100.2* 100.9* 100.3* 100.9* 100.3*  PLT 141* 150 124* 116* 025*   Basic Metabolic Panel: Recent Labs  Lab 03/13/20 0814  NA 141  K 5.0  CL 107  CO2 26  GLUCOSE 146*  BUN 28*  CREATININE 1.38*  CALCIUM 9.2   GFR: Estimated Creatinine Clearance: 38.4 mL/min (A) (by C-G formula based on SCr of 1.38 mg/dL (H)). Liver Function Tests: Recent Labs   Lab 03/13/20 0814  AST 22  ALT 13  ALKPHOS 53  BILITOT 1.2  PROT 7.2  ALBUMIN 3.8   No results for input(s): LIPASE, AMYLASE in the last 168 hours. No results for input(s): AMMONIA in the last 168 hours. Coagulation Profile: Recent Labs  Lab 03/13/20 1755  INR 1.3*   Cardiac Enzymes: No results for input(s): CKTOTAL, CKMB, CKMBINDEX, TROPONINI in the last 168 hours. BNP (last 3 results) No results for input(s): PROBNP in the last 8760 hours. HbA1C: No results for input(s): HGBA1C in the last 72 hours. CBG: Recent Labs  Lab 03/13/20 2154 03/14/20 0856  GLUCAP 176* 96   Lipid Profile: No results for input(s): CHOL, HDL, LDLCALC, TRIG, CHOLHDL, LDLDIRECT in the last 72 hours. Thyroid Function Tests: No results for input(s): TSH, T4TOTAL, FREET4, T3FREE, THYROIDAB in the last 72 hours. Anemia Panel: No results for input(s): VITAMINB12, FOLATE, FERRITIN, TIBC, IRON, RETICCTPCT in the last 72 hours. Sepsis Labs: No results for input(s): PROCALCITON, LATICACIDVEN in the last 168 hours.  Recent Results (from the past 240 hour(s))  SARS Coronavirus 2 by RT PCR (hospital order, performed in [facility] hospital lab) Nasopharyngeal Nasopharyngeal Swab     Status: None   Collection Time: 03/13/20  3:13 PM   Specimen: Nasopharyngeal Swab  Result Value Ref Range Status   SARS Coronavirus 2 NEGATIVE NEGATIVE Final    Comment: (NOTE) SARS-CoV-2 target nucleic acids are NOT DETECTED.  The SARS-CoV-2 RNA is generally detectable in upper and lower respiratory specimens during the acute phase of infection. The lowest concentration of SARS-CoV-2 viral copies this assay can detect is 250 copies / mL. A negative result does not preclude SARS-CoV-2 infection and should not be used as the sole basis for treatment or other patient management decisions.  A negative result may occur with improper specimen collection / handling, submission of specimen other than nasopharyngeal swab,  presence of viral mutation(s) within the areas targeted by this assay, and inadequate number of viral copies (<250 copies / mL). A negative result must be combined with clinical observations, patient history, and epidemiological information.  Fact Sheet for Patients:   StrictlyIdeas[employer]no  Fact Sheet for Healthcare Providers: BankingDealers.co.za  This test is not yet approved or  cleared by the [place] FDA and has been authorized for detection and/or diagnosis of SARS-CoV-2 by FDA under an Emergency Use Authorization (EUA).  This EUA will remain in effect (meaning this test can be used) for the duration of the COVID-19 declaration under Section 564(b)(1) of the Act, 21 U.S.C. section 360bbb-3(b)(1), unless the authorization is terminated or revoked sooner.  Performed at [facility], [address], [place], [place] [address]          Radiology Studies: No results found.      Scheduled Meds: . amLODipine  5 mg Oral Daily  . brimonidine  1  drop Left Eye BID   And  . timolol  1 drop Left Eye BID  . calcium carbonate  1 tablet Oral Once per day on Mon Wed Fri  . fesoterodine  4 mg Oral Daily  . latanoprost  1 drop Both Eyes QHS  . pantoprazole (PROTONIX) IV  40 mg Intravenous Q12H  . tamsulosin  0.4 mg Oral Daily   Continuous Infusions: . sodium chloride 75 mL/hr at 03/13/20 1626          Callista Hoh, MD [facility] 03/14/2020, 11:19 AM

## 2020-03-14 NOTE — Progress Notes (Signed)
David Minium, MD [facility]   [address], [address] [place], [place] [address] Phone: [phone] Fax : [phone]   Subjective: The patient reports that he had normal bowel movements yesterday but started to have black stools again last night.  The patient denies any abdominal pain.  His hemoglobin has been stable and is up from yesterday.   Objective: Vital signs in last 24 hours: Vitals:   03/13/20 2351 03/14/20 0412 03/14/20 0751 03/14/20 1203  BP: (!) 104/59 103/65 116/78 100/79  Pulse: 69 69 70 70  Resp: 18 19 18 18   Temp: 97.9 F (36.6 C) 97.8 F (36.6 C) 98.3 F (36.8 C) 98 F (36.7 C)  TempSrc:   Oral Oral  SpO2: 95% 94% 95% 96%  Weight:      Height:       Weight change:   Intake/Output Summary (Last 24 hours) at 03/14/2020 1528 Last data filed at 03/14/2020 1045 Gross per 24 hour  Intake 240 ml  Output --  Net 240 ml     Exam: General: Sitting up in bed in no apparent distress alert and orientated x3   Lab Results: @LABTEST2 @ Micro Results: Recent Results (from the past 240 hour(s))  SARS Coronavirus 2 by RT PCR (hospital order, performed in [facility] hospital lab) Nasopharyngeal Nasopharyngeal Swab     Status: None   Collection Time: 03/13/20  3:13 PM   Specimen: Nasopharyngeal Swab  Result Value Ref Range Status   SARS Coronavirus 2 NEGATIVE NEGATIVE Final    Comment: (NOTE) SARS-CoV-2 target nucleic acids are NOT DETECTED.  The SARS-CoV-2 RNA is generally detectable in upper and lower respiratory specimens during the acute phase of infection. The lowest concentration of SARS-CoV-2 viral copies this assay can detect is 250 copies / mL. A negative result does not preclude SARS-CoV-2 infection and should not be used as the sole basis for treatment or other patient management decisions.  A negative result may occur with improper specimen collection / handling, submission of specimen other than nasopharyngeal swab, presence of viral mutation(s)  within the areas targeted by this assay, and inadequate number of viral copies (<250 copies / mL). A negative result must be combined with clinical observations, patient history, and epidemiological information.  Fact Sheet for Patients:   [facility]  Fact Sheet for Healthcare Providers: 03/15/20  This test is not yet approved or  cleared by the [employer] FDA and has been authorized for detection and/or diagnosis of SARS-CoV-2 by FDA under an Emergency Use Authorization (EUA).  This EUA will remain in effect (meaning this test can be used) for the duration of the COVID-19 declaration under Section 564(b)(1) of the Act, 21 U.S.C. section 360bbb-3(b)(1), unless the authorization is terminated or revoked sooner.  Performed at [facility], [address] [place], [address] [place]    Studies/Results: No results found. Medications: I have reviewed the patient's current medications. Scheduled Meds: . amLODipine  5 mg Oral Daily  . brimonidine  1 drop Left Eye BID   And  . timolol  1 drop Left Eye BID  . calcium carbonate  1 tablet Oral Once per day on Mon Wed Fri  . fesoterodine  4 mg Oral Daily  . latanoprost  1 drop Both Eyes QHS  . pantoprazole (PROTONIX) IV  40 mg Intravenous Q12H  . polyethylene glycol-electrolytes  4,000 mL Oral Once  . tamsulosin  0.4 mg Oral Daily   Continuous Infusions: . sodium chloride 50 mL/hr at 03/14/20 1236  PRN Meds:.acetaminophen, albuterol, docusate sodium, hydrALAZINE, ondansetron (ZOFRAN) IV   Assessment: Principal Problem:   Rectal bleeding Active Problems:   Benign essential HTN   Benign prostatic hyperplasia without lower urinary tract symptoms   Chronic obstructive pulmonary disease (HCC)   Chronic diastolic CHF (congestive heart failure) (HCC)   Paroxysmal atrial fibrillation (HCC)   Acute renal failure superimposed on stage 3a chronic kidney  disease (HCC)   Bloody stool   GI bleed    Plan: This patient came in with dark stools and is off his Eliquis since yesterday morning.  The patient will have an EGD and colonoscopy tomorrow by Dr. Toledo who will be covering for the weekend.  The patient likely has an upper GI bleed but a colonoscopy is being performed since he is already off of his anticoagulation and it should be to hold the anticoagulation for discharge next time.  The patient has been explained the plan and agrees with it.  He will be given a prep tonight for the colonoscopy tomorrow.   LOS: 0 days   Valeta Paz 03/14/2020, 3:28 PM Pager [phone] 7am-5pm  Check AMION for 5pm -7am coverage and on weekends  

## 2020-03-15 ENCOUNTER — Encounter: Payer: Self-pay | Admitting: Internal Medicine

## 2020-03-15 ENCOUNTER — Inpatient Hospital Stay: Payer: Medicare Other | Admitting: Anesthesiology

## 2020-03-15 ENCOUNTER — Encounter
Admission: EM | Disposition: A | Discharge status: Home / Self Care | Payer: Self-pay | Source: Home / Self Care | Attending: Internal Medicine

## 2020-03-15 HISTORY — PX: ESOPHAGOGASTRODUODENOSCOPY (EGD) WITH PROPOFOL: SHX5813

## 2020-03-15 HISTORY — PX: COLONOSCOPY WITH PROPOFOL: SHX5780

## 2020-03-15 LAB — CBC WITH DIFFERENTIAL/PLATELET
Abs Immature Granulocytes: 0.01 10*3/uL (ref 0.00–0.07)
Basophils Absolute: 0 10*3/uL (ref 0.0–0.1)
Basophils Relative: 0 %
Eosinophils Absolute: 0.1 10*3/uL (ref 0.0–0.5)
Eosinophils Relative: 2 %
HCT: 27.5 % — ABNORMAL LOW (ref 39.0–52.0)
Hemoglobin: 9.3 g/dL — ABNORMAL LOW (ref 13.0–17.0)
Immature Granulocytes: 0 %
Lymphocytes Relative: 21 %
Lymphs Abs: 1 10*3/uL (ref 0.7–4.0)
MCH: 33.9 pg (ref 26.0–34.0)
MCHC: 33.8 g/dL (ref 30.0–36.0)
MCV: 100.4 fL — ABNORMAL HIGH (ref 80.0–100.0)
Monocytes Absolute: 0.5 10*3/uL (ref 0.1–1.0)
Monocytes Relative: 10 %
Neutro Abs: 3.2 10*3/uL (ref 1.7–7.7)
Neutrophils Relative %: 67 %
Platelets: 102 10*3/uL — ABNORMAL LOW (ref 150–400)
RBC: 2.74 MIL/uL — ABNORMAL LOW (ref 4.22–5.81)
RDW: 14 % (ref 11.5–15.5)
WBC: 4.8 10*3/uL (ref 4.0–10.5)
nRBC: 0 % (ref 0.0–0.2)

## 2020-03-15 LAB — MAGNESIUM: Magnesium: 1.8 mg/dL (ref 1.7–2.4)

## 2020-03-15 LAB — BASIC METABOLIC PANEL
Anion gap: 6 (ref 5–15)
BUN: 29 mg/dL — ABNORMAL HIGH (ref 8–23)
CO2: 24 mmol/L (ref 22–32)
Calcium: 8.2 mg/dL — ABNORMAL LOW (ref 8.9–10.3)
Chloride: 109 mmol/L (ref 98–111)
Creatinine, Ser: 1.19 mg/dL (ref 0.61–1.24)
GFR calc Af Amer: >60 mL/min (ref >60)
GFR calc non Af Amer: 54 mL/min — ABNORMAL LOW (ref >60)
Glucose, Bld: 110 mg/dL — ABNORMAL HIGH (ref 70–99)
Potassium: 4.4 mmol/L (ref 3.5–5.1)
Sodium: 139 mmol/L (ref 135–145)

## 2020-03-15 LAB — GLUCOSE, CAPILLARY: Glucose-Capillary: 114 mg/dL — ABNORMAL HIGH (ref 70–99)

## 2020-03-15 SURGERY — ESOPHAGOGASTRODUODENOSCOPY (EGD) WITH PROPOFOL
Anesthesia: General

## 2020-03-15 MED ORDER — LIDOCAINE HCL (CARDIAC) PF 100 MG/5ML IV SOSY
PREFILLED_SYRINGE | INTRAVENOUS | Status: DC | PRN
  Administered 2020-03-15: 60 mg via INTRAVENOUS

## 2020-03-15 MED ORDER — PROPOFOL 10 MG/ML IV BOLUS
INTRAVENOUS | Status: DC | PRN
  Administered 2020-03-15: 80 mg via INTRAVENOUS
  Administered 2020-03-15: 40 mg via INTRAVENOUS
  Administered 2020-03-15 (×2): 30 mg via INTRAVENOUS
  Administered 2020-03-15: 40 mg via INTRAVENOUS
  Administered 2020-03-15 (×2): 30 mg via INTRAVENOUS

## 2020-03-15 MED ORDER — PHENYLEPHRINE HCL (PRESSORS) 10 MG/ML IV SOLN
INTRAVENOUS | Status: DC | PRN
  Administered 2020-03-15: 100 ug via INTRAVENOUS
  Administered 2020-03-15: 150 ug via INTRAVENOUS

## 2020-03-15 MED ORDER — LIDOCAINE HCL (PF) 2 % IJ SOLN
INTRAMUSCULAR | Status: AC
  Filled 2020-03-15: qty 5

## 2020-03-15 MED ORDER — PROPOFOL 500 MG/50ML IV EMUL
INTRAVENOUS | Status: AC
  Filled 2020-03-15: qty 50

## 2020-03-15 NOTE — Op Note (Signed)
[facility] Patient Name: David Frey Procedure Date: 03/15/2020 10:21 AM MRN: [id number] Account #: [id number] Date of Birth: 07/31/1930 Admit Type: Inpatient Age: 86 Room: [facility] ENDO ROOM 4 Gender: Male Note Status: Finalized Procedure:             Upper GI endoscopy Indications:           Acute post hemorrhagic anemia, Melena Providers:             Benay Pike. Alice Reichert MD, MD Referring MD:          Precious Bard, MD (Referring MD) Medicines:             Propofol per Anesthesia Complications:         No immediate complications. Procedure:             Pre-Anesthesia Assessment:                        - The risks and benefits of the procedure and the                         sedation options and risks were discussed with the                         patient. All questions were answered and informed                         consent was obtained.                        - Patient identification and proposed procedure were                         verified prior to the procedure by the nurse. The                         procedure was verified in the procedure room.                        - ASA Grade Assessment: III - A patient with severe                         systemic disease.                        - After reviewing the risks and benefits, the patient                         was deemed in satisfactory condition to undergo the                         procedure.                        After obtaining informed consent, the endoscope was                         passed under direct vision. Throughout the procedure,                         the patient's  blood pressure, pulse, and oxygen                         saturations were monitored continuously. The Endoscope                         was introduced through the mouth, and advanced to the                         third part of duodenum. The upper GI endoscopy was                         accomplished  without difficulty. The patient tolerated                         the procedure well. Findings:      The examined esophagus was normal.      A 2 cm hiatal hernia was present.      The examined duodenum was normal.      The exam was otherwise without abnormality. Impression:            - Normal esophagus.                        - 2 cm hiatal hernia.                        - Normal examined duodenum.                        - The examination was otherwise normal.                        - No specimens collected. Recommendation:        - Continue present medications.                        - Proceed with colonoscopy Procedure Code(s):     --- Professional ---                        [phone], Esophagogastroduodenoscopy, flexible,                         transoral; diagnostic, including collection of                         specimen(s) by brushing or washing, when performed                         (separate procedure) Diagnosis Code(s):     --- Professional ---                        K92.1, Melena (includes Hematochezia)                        D62, Acute posthemorrhagic anemia                        K44.9, Diaphragmatic hernia without obstruction or  gangrene CPT copyright 2019 American Medical Association. All rights reserved. The codes documented in this report are preliminary and upon coder review may  be revised to meet current compliance requirements. Stanton Kidney MD, MD 03/15/2020 10:49:28 AM This report has been signed electronically. Number of Addenda: 0 Note Initiated On: 03/15/2020 10:21 AM Estimated Blood Loss:  Estimated blood loss: none.      [facility]

## 2020-03-15 NOTE — Interval H&P Note (Signed)
History and Physical Interval Note:  03/15/2020 10:29 AM  David Frey  has presented today for surgery, with the diagnosis of GI bleed.  The various methods of treatment have been discussed with the patient and family. After consideration of risks, benefits and other options for treatment, the patient has consented to  Procedure(s): ESOPHAGOGASTRODUODENOSCOPY (EGD) WITH PROPOFOL (N/A) COLONOSCOPY WITH PROPOFOL (N/A) as a surgical intervention.  The patient's history has been reviewed, patient examined, no change in status, stable for surgery.  I have reviewed the patient's chart and labs.  Questions were answered to the patient's satisfaction.     [place], [place]

## 2020-03-15 NOTE — Progress Notes (Signed)
Patient ID: David Frey, male   DOB: May 22, 1930, 86 y.o.   MRN: [id number]  PROGRESS NOTE    SPYROS WINCH  NID:[id number] DOB: Nov 15, 1929 DOA: 03/13/2020 PCP: Marinda Elk, MD   Brief Narrative:  86 year old male with history of atrial fibrillation on Eliquis, hypertension, COPD, asthma, gout, chronic kidney disease stage III, chronic diastolic CHF, BPH, pacemaker placement presented with rectal bleeding.  On presentation, hemoglobin was 14.9.  GI was consulted.  Assessment & Plan:   Rectal bleeding -Hemoglobin 14.9 on presentation.  Hemoglobin pending this morning.   -GI following.  Planning for EGD/colonoscopy today -Monitor H&H.  Continue Protonix.  Transfuse if hemoglobin less than 7.  Benign essential hypertension  -Blood pressure stable.  Continue amlodipine.  BPH -Continue Flomax  COPD Stable.  As needed albuterol  Chronic diastolic CHF -Echo on 1/44/3154 showed EF of more than 55%.  CHF is compensated.  Strict input and output.  Daily weights.  Paroxysmal A. fib -Eliquis on hold.  Rate controlled.  Acute kidney injury on chronic kidney disease stage IIIa -Creatinine 1.38 on presentation.  Baseline creatinine 1.01 on 12/19/2018.  No creatinine today.  Repeat a.m. labs.  Continue IV fluids at 50 cc an hour   DVT prophylaxis: SCDs Code Status: Partial: Okay for CPR, no intubation Family Communication: Wife at bedside Disposition Plan: Status is: Observation  The patient will require care spanning > 2 midnights and should be moved to inpatient because: Ongoing diagnostic testing needed not appropriate for outpatient work up.    Dispo: The patient is from: Home              Anticipated d/c is to: Home              Anticipated d/c date is: 1 day              Patient currently is not medically stable to d/c.   Consultants: GI  Procedures: None  Antimicrobials: None   Subjective: Patient seen and examined at bedside. No overnight fever, nausea,  vomiting reported.  Still having intermittent rectal bleeding. Objective: Vitals:   03/14/20 1537 03/14/20 2000 03/14/20 2357 03/15/20 0246  BP: 130/83 (!) 145/85 134/82 125/74  Pulse: 75  72 70  Resp: 18  19 16   Temp: 98.1 F (36.7 C) 98 F (36.7 C) 98.3 F (36.8 C) (!) 97.5 F (36.4 C)  TempSrc:  Oral  Oral  SpO2: 98% 97% 96% 96%  Weight:      Height:        Intake/Output Summary (Last 24 hours) at 03/15/2020 0727 Last data filed at 03/14/2020 1500 Gross per 24 hour  Intake 790 ml  Output --  Net 790 ml   Filed Weights   03/13/20 0808  Weight: 84.4 kg    Examination:  General exam: No acute distress.  Elderly male lying in bed.  Respiratory system: Bilateral decreased breath sounds at bases, no wheezing Cardiovascular system: rate controlled; S1 S2 heard Gastrointestinal system: Abdomen is nondistended, soft and nontender. Bowel sounds are heard. Extremities: No clubbing;  trace lower extremity edema present  Data Reviewed: I have personally reviewed following labs and imaging studies  CBC: Recent Labs  Lab 03/13/20 0814 03/13/20 1614 03/13/20 2111 03/14/20 0329 03/14/20 0919  WBC 4.7 8.1 6.8 5.6 7.8  HGB 14.9 14.7 13.1 11.6* 12.6*  HCT 44.2 43.8 39.2 34.3* 37.4*  MCV 100.2* 100.9* 100.3* 100.9* 100.3*  PLT 141* 150 124* 116* 008*   Basic Metabolic  Panel: Recent Labs  Lab 03/13/20 0814  NA 141  K 5.0  CL 107  CO2 26  GLUCOSE 146*  BUN 28*  CREATININE 1.38*  CALCIUM 9.2   GFR: Estimated Creatinine Clearance: 38.4 mL/min (A) (by C-G formula based on SCr of 1.38 mg/dL (H)). Liver Function Tests: Recent Labs  Lab 03/13/20 0814  AST 22  ALT 13  ALKPHOS 53  BILITOT 1.2  PROT 7.2  ALBUMIN 3.8   No results for input(s): LIPASE, AMYLASE in the last 168 hours. No results for input(s): AMMONIA in the last 168 hours. Coagulation Profile: Recent Labs  Lab 03/13/20 1755  INR 1.3*   Cardiac Enzymes: No results for input(s): CKTOTAL, CKMB,  CKMBINDEX, TROPONINI in the last 168 hours. BNP (last 3 results) No results for input(s): PROBNP in the last 8760 hours. HbA1C: No results for input(s): HGBA1C in the last 72 hours. CBG: Recent Labs  Lab 03/13/20 2154 03/14/20 0856 03/14/20 1201 03/14/20 1538 03/14/20 2210  GLUCAP 176* 96 121* 120* 110*   Lipid Profile: No results for input(s): CHOL, HDL, LDLCALC, TRIG, CHOLHDL, LDLDIRECT in the last 72 hours. Thyroid Function Tests: No results for input(s): TSH, T4TOTAL, FREET4, T3FREE, THYROIDAB in the last 72 hours. Anemia Panel: No results for input(s): VITAMINB12, FOLATE, FERRITIN, TIBC, IRON, RETICCTPCT in the last 72 hours. Sepsis Labs: No results for input(s): PROCALCITON, LATICACIDVEN in the last 168 hours.  Recent Results (from the past 240 hour(s))  SARS Coronavirus 2 by RT PCR (hospital order, performed in [facility] hospital lab) Nasopharyngeal Nasopharyngeal Swab     Status: None   Collection Time: 03/13/20  3:13 PM   Specimen: Nasopharyngeal Swab  Result Value Ref Range Status   SARS Coronavirus 2 NEGATIVE NEGATIVE Final    Comment: (NOTE) SARS-CoV-2 target nucleic acids are NOT DETECTED.  The SARS-CoV-2 RNA is generally detectable in upper and lower respiratory specimens during the acute phase of infection. The lowest concentration of SARS-CoV-2 viral copies this assay can detect is 250 copies / mL. A negative result does not preclude SARS-CoV-2 infection and should not be used as the sole basis for treatment or other patient management decisions.  A negative result may occur with improper specimen collection / handling, submission of specimen other than nasopharyngeal swab, presence of viral mutation(s) within the areas targeted by this assay, and inadequate number of viral copies (<250 copies / mL). A negative result must be combined with clinical observations, patient history, and epidemiological information.  Fact Sheet for Patients:     [employer]  Fact Sheet for Healthcare Providers: https://pope.com/  This test is not yet approved or  cleared by the [place] FDA and has been authorized for detection and/or diagnosis of SARS-CoV-2 by FDA under an Emergency Use Authorization (EUA).  This EUA will remain in effect (meaning this test can be used) for the duration of the COVID-19 declaration under Section 564(b)(1) of the Act, 21 U.S.C. section 360bbb-3(b)(1), unless the authorization is terminated or revoked sooner.  Performed at [facility], [address] [place], [place] [address]          Radiology Studies: No results found.      Scheduled Meds: . amLODipine  5 mg Oral Daily  . brimonidine  1 drop Left Eye BID   And  . timolol  1 drop Left Eye BID  . calcium carbonate  1 tablet Oral Once per day on Mon Wed Fri  . fesoterodine  4 mg Oral Daily  .  latanoprost  1 drop Both Eyes QHS  . pantoprazole (PROTONIX) IV  40 mg Intravenous Q12H  . tamsulosin  0.4 mg Oral Daily   Continuous Infusions: . sodium chloride 50 mL/hr at 03/14/20 2355          Glade Lloyd, MD [facility] 03/15/2020, 7:27 AM

## 2020-03-15 NOTE — Op Note (Signed)
[facility] Patient Name: David Frey Procedure Date: 03/15/2020 10:21 AM MRN: [id number] Account #: [id number] Date of Birth: 01/28/1930 Admit Type: Inpatient Age: 86 Room: [facility] ENDO ROOM 4 Gender: Male Note Status: Finalized Procedure:             Colonoscopy Indications:           Hematochezia, Melena, Acute post hemorrhagic anemia Providers:             Boykin Nearing. Norma Fredrickson MD, MD Referring MD:          Marilynne Halsted, MD (Referring MD) Medicines:             Propofol per Anesthesia Complications:         No immediate complications. Procedure:             Pre-Anesthesia Assessment:                        - The risks and benefits of the procedure and the                         sedation options and risks were discussed with the                         patient. All questions were answered and informed                         consent was obtained.                        - Patient identification and proposed procedure were                         verified prior to the procedure by the nurse. The                         procedure was verified in the procedure room.                        - ASA Grade Assessment: III - A patient with severe                         systemic disease.                        - After reviewing the risks and benefits, the patient                         was deemed in satisfactory condition to undergo the                         procedure.                        After obtaining informed consent, the colonoscope was                         passed under direct vision. Throughout the procedure,                         the patient's blood  pressure, pulse, and oxygen                         saturations were monitored continuously. The                         Colonoscope was introduced through the anus and                         advanced to the the cecum, identified by appendiceal                         orifice and  ileocecal valve. The colonoscopy was                         somewhat difficult due to significant looping.                         Successful completion of the procedure was aided by                         applying abdominal pressure. The patient tolerated the                         procedure well. The quality of the bowel preparation                         was fair. The ileocecal valve, the appendiceal orifice                         and the rectum were photographed. Findings:      The perianal and digital rectal examinations were normal. Pertinent       negatives include normal sphincter tone and no palpable rectal lesions.      Non-bleeding internal hemorrhoids were found during retroflexion. The       hemorrhoids were Grade I (internal hemorrhoids that do not prolapse).      Hematin, clot and some red blood was found in the entire colon.      Multiple small and large-mouthed diverticula were found in the entire       colon. There was no evidence of diverticular bleeding.      There is no endoscopic evidence of erythema, inflammation, mass, polyps       or ulcerations in the entire colon.      Normal mucosa was found in the entire colon.      The exam was otherwise without abnormality. Impression:            - Preparation of the colon was fair.                        - Non-bleeding internal hemorrhoids.                        - Blood in the entire examined colon.                        - Moderate diverticulosis in the entire examined  colon. There was no evidence of diverticular bleeding.                        - Normal mucosa in the entire examined colon.                        - The examination was otherwise normal.                        - No specimens collected.                        - I suspect the patient has a recent, though hopefully                         resolving, sigmoid colonic diverticular bleed. Most of                         the blood  noted was in the sigmoid colon and rectum,                         though some hematin and clots were also noted in the                         cecum. I was unable to identify which of the multiple                         diverticula had bled. Therefore, no endoscopic                         hemostasis was feasible. Recommendation:        - Follow H/H, clinically for rebleeding.                        trbc scan for significant rebleeding per rectum.                        Vascular consult will be necessary if we can localize                         area of colon bleeding on any follow up testing.                        Continuing to follow progress closely.                        I will allow some clear liquids today during                         observation and advance as possible once bleeding                         resolves.                        - The findings and recommendations were discussed with                         the patient.  Procedure Code(s):     --- Professional ---                        [phone], Colonoscopy, flexible; diagnostic, including                         collection of specimen(s) by brushing or washing, when                         performed (separate procedure) Diagnosis Code(s):     --- Professional ---                        K57.30, Diverticulosis of large intestine without                         perforation or abscess without bleeding                        D62, Acute posthemorrhagic anemia                        K92.1, Melena (includes Hematochezia)                        K92.2, Gastrointestinal hemorrhage, unspecified                        K64.0, First degree hemorrhoids CPT copyright 2019 American Medical Association. All rights reserved. The codes documented in this report are preliminary and upon coder review may  be revised to meet current compliance requirements. Stanton Kidney MD, MD 03/15/2020 11:30:31 AM This report has been signed  electronically. Number of Addenda: 0 Note Initiated On: 03/15/2020 10:21 AM Scope Withdrawal Time: 0 hours 10 minutes 5 seconds  Total Procedure Duration: 0 hours 24 minutes 23 seconds  Estimated Blood Loss:  Estimated blood loss was minimal.      [facility]

## 2020-03-15 NOTE — Transfer of Care (Signed)
Immediate Anesthesia Transfer of Care Note  Patient: David Frey  Procedure(s) Performed: ESOPHAGOGASTRODUODENOSCOPY (EGD) WITH PROPOFOL (N/A ) COLONOSCOPY WITH PROPOFOL (N/A )  Patient Location: Endoscopy Unit  Anesthesia Type:General  Level of Consciousness: drowsy  Airway & Oxygen Therapy: Patient Spontanous Breathing and Patient connected to nasal cannula oxygen  Post-op Assessment: Report given to RN and Post -op Vital signs reviewed and stable  Post vital signs: Reviewed and stable  Last Vitals:  Vitals Value Taken Time  BP 95/66 03/15/20 1115  Temp 36.5 C 03/15/20 1115  Pulse    Resp 22 03/15/20 1115  SpO2 99 % 03/15/20 1115    Last Pain:  Vitals:   03/15/20 1115  TempSrc: Temporal  PainSc: 0-No pain         Complications: No complications documented.

## 2020-03-15 NOTE — Anesthesia Preprocedure Evaluation (Addendum)
Anesthesia Evaluation  Patient identified by MRN, date of birth, ID band Patient awake    Reviewed: Allergy & Precautions, NPO status , Patient's Chart, lab work & pertinent test results  Airway Mallampati: III       Dental   Pulmonary asthma , COPD,           Cardiovascular hypertension, + CAD, + Past MI and +CHF  + dysrhythmias Atrial Fibrillation + pacemaker      Neuro/Psych    GI/Hepatic Neg liver ROS,   Endo/Other  negative endocrine ROS  Renal/GU Renal InsufficiencyRenal disease     Musculoskeletal  (+) Arthritis , Osteoarthritis,    Abdominal   Peds  Hematology  (+) anemia ,   Anesthesia Other Findings Past Medical History: No date: Actinic keratosis 10/21/2015: Arthritis, degenerative 12/13/2014: Asthma without status asthmaticus No date: Atrial fibrillation (HCC) 12/13/2014: Benign fibroma of prostate 10/30/2012: Benign prostatic hyperplasia with urinary obstruction 11/28/2014: Blood glucose elevated 12/13/2014: BP (high blood pressure) 12/13/2014: Chronic obstructive pulmonary disease (HCC) 07/09/2014: Chronic systolic heart failure (HCC)     Comment:  Overview:  Global ef 45%  04/01/2014: Combined fat and carbohydrate induced hyperlipemia 04/01/2014: Edema leg 10/30/2012: Excessive urination at night 12/13/2014: Gout No date: Hypertension 12/13/2014: Impaired renal function 05/13/2007: Infection and inflammatory reaction due to internal  prosthetic device, implant, and graft     Comment:  Overview:  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 02/25/2010: Methicillin susceptible Staphylococcus aureus infection     Comment:  Overview:  Methicillin Susceptible Staphylococcus Aureus              Infection  07/09/2014: MI (mitral incompetence) 12/13/2014: Obstruction to urinary outflow 07/09/2014: Paroxysmal atrial fibrillation (HCC)  Reproductive/Obstetrics                             Anesthesia Physical Anesthesia Plan  ASA: III  Anesthesia Plan: General   Post-op Pain Management:    Induction: Intravenous  PONV Risk Score and Plan: Propofol infusion  Airway Management Planned: Nasal Cannula  Additional Equipment:   Intra-op Plan:   Post-operative Plan:   Informed Consent: I have reviewed the patients History and Physical, chart, labs and discussed the procedure including the risks, benefits and alternatives for the proposed anesthesia with the patient or authorized representative who has indicated his/her understanding and acceptance.     Dental advisory given  Plan Discussed with: CRNA and Surgeon  Anesthesia Plan Comments:         Anesthesia Quick Evaluation

## 2020-03-16 LAB — CBC WITH DIFFERENTIAL/PLATELET
Abs Immature Granulocytes: 0.02 10*3/uL (ref 0.00–0.07)
Basophils Absolute: 0 10*3/uL (ref 0.0–0.1)
Basophils Relative: 0 %
Eosinophils Absolute: 0.1 10*3/uL (ref 0.0–0.5)
Eosinophils Relative: 3 %
HCT: 23.9 % — ABNORMAL LOW (ref 39.0–52.0)
Hemoglobin: 8.1 g/dL — ABNORMAL LOW (ref 13.0–17.0)
Immature Granulocytes: 1 %
Lymphocytes Relative: 25 %
Lymphs Abs: 0.8 10*3/uL (ref 0.7–4.0)
MCH: 33.8 pg (ref 26.0–34.0)
MCHC: 33.9 g/dL (ref 30.0–36.0)
MCV: 99.6 fL (ref 80.0–100.0)
Monocytes Absolute: 0.3 10*3/uL (ref 0.1–1.0)
Monocytes Relative: 10 %
Neutro Abs: 2.1 10*3/uL (ref 1.7–7.7)
Neutrophils Relative %: 61 %
Platelets: 95 10*3/uL — ABNORMAL LOW (ref 150–400)
RBC: 2.4 MIL/uL — ABNORMAL LOW (ref 4.22–5.81)
RDW: 14 % (ref 11.5–15.5)
WBC: 3.4 10*3/uL — ABNORMAL LOW (ref 4.0–10.5)
nRBC: 0 % (ref 0.0–0.2)

## 2020-03-16 LAB — BASIC METABOLIC PANEL
Anion gap: 5 (ref 5–15)
BUN: 19 mg/dL (ref 8–23)
CO2: 26 mmol/L (ref 22–32)
Calcium: 8.2 mg/dL — ABNORMAL LOW (ref 8.9–10.3)
Chloride: 107 mmol/L (ref 98–111)
Creatinine, Ser: 1.23 mg/dL (ref 0.61–1.24)
GFR calc Af Amer: >60 mL/min (ref >60)
GFR calc non Af Amer: 52 mL/min — ABNORMAL LOW (ref >60)
Glucose, Bld: 98 mg/dL (ref 70–99)
Potassium: 3.8 mmol/L (ref 3.5–5.1)
Sodium: 138 mmol/L (ref 135–145)

## 2020-03-16 LAB — MAGNESIUM: Magnesium: 1.7 mg/dL (ref 1.7–2.4)

## 2020-03-16 NOTE — Progress Notes (Signed)
Patient ID: David Frey, male   DOB: [birth year], 86 y.o.   MRN: [id number]  PROGRESS NOTE    David Frey  YQM:[id number] DOB: 1930/02/10 DOA: 03/13/2020 PCP: Patrice Paradise, MD   Brief Narrative:  86 year old male with history of atrial fibrillation on Eliquis, hypertension, COPD, asthma, gout, chronic kidney disease stage III, chronic diastolic CHF, BPH, pacemaker placement presented with rectal bleeding.  On presentation, hemoglobin was 14.9.  GI was consulted.  Assessment & Plan:   Rectal bleeding -Hemoglobin 14.9 on presentation.  Hemoglobinv8.1 this morning.   -GI following.  Status post EGD and colonoscopy on 03/15/2020.  Colonoscopy showed nonbleeding internal hemorrhoids and blood in the entire colon with moderate diverticulosis but no evidence of diverticular bleeding.  EGD was essentially normal. -No further rectal bleeding since yesterday.  DC IV fluids.  Currently on clear liquid diet: We will advance to soft diet. -Will monitor H&H again tomorrow morning and if patient remains stable with no further drop in hemoglobin/further rectal bleeding and tolerates diet, will probably discharge patient home.  Follow further GI recommendations -DC Protonix.  Transfuse if hemoglobin less than 7.  Benign essential hypertension  -Blood pressure stable.  Continue amlodipine.  BPH -Continue Flomax  COPD Stable.  As needed albuterol  Chronic diastolic CHF -Echo on 11/23/2018 showed EF of more than 55%.  CHF is compensated.  Strict input and output.  Daily weights.  Paroxysmal A. fib -Eliquis on hold.  Rate controlled.  Acute kidney injury on chronic kidney disease stage IIIa -Creatinine 1.38 on presentation.  Baseline creatinine 1.01 on 12/19/2018.  Creatinine 1.23 today.  Repeat a.m. labs.  DC IV fluids   DVT prophylaxis: SCDs Code Status: Partial: Okay for CPR, no intubation Family Communication: None at bedside Disposition Plan: Status is: Inpatient  Will need to  remain inpatient.  Hemoglobin has further dropped.  Repeat a.m. H&H.  Probable discharge tomorrow if hemoglobin remains stable.  Dispo: The patient is from: Home              Anticipated d/c is to: Home              Anticipated d/c date is: 1 day              Patient currently is not medically stable to d/c.   Consultants: GI  Procedures:  Colonoscopy on 03/15/2020 Impression:            - Preparation of the colon was fair.                        - Non-bleeding internal hemorrhoids.                        - Blood in the entire examined colon.                        - Moderate diverticulosis in the entire examined                         colon. There was no evidence of diverticular bleeding.                        - Normal mucosa in the entire examined colon.                        -  The examination was otherwise normal.                        - No specimens collected.                        - I suspect the patient has a recent, though hopefully                         resolving, sigmoid colonic diverticular bleed. Most of                         the blood noted was in the sigmoid colon and rectum,                         though some hematin and clots were also noted in the                         cecum. I was unable to identify which of the multiple                         diverticula had bled. Therefore, no endoscopic                         hemostasis was feasible. Recommendation:        - Follow H/H, clinically for rebleeding.                        trbc scan for significant rebleeding per rectum.                        Vascular consult will be necessary if we can localize                         area of colon bleeding on any follow up testing.                        Continuing to follow progress closely.                        I will allow some clear liquids today during                         observation and advance as possible once bleeding                         resolves.                         - The findings and recommendations were discussed with   EGD on 03/15/2020 Impression:            - Normal esophagus.                        - 2 cm hiatal hernia.                        - Normal examined duodenum.                        -  The examination was otherwise normal.                        - No specimens collected. Recommendation:        - Continue present medications.  Antimicrobials: None   Subjective: Patient seen and examined at bedside.  Denies any overnight rectal bleeding.  Tolerating clear liquid diet.  Feels weak.  No overnight fever or vomiting reported  objective: Vitals:   03/15/20 1949 03/16/20 0013 03/16/20 0410 03/16/20 0824  BP: 109/70 99/63 99/67  116/78  Pulse: 70 70 74 70  Resp: 16 16 17 16   Temp: 97.8 F (36.6 C) 98.5 F (36.9 C) 98.1 F (36.7 C) 98.2 F (36.8 C)  TempSrc: Oral     SpO2: 97% 95% 98% 97%  Weight:      Height:        Intake/Output Summary (Last 24 hours) at 03/16/2020 1003 Last data filed at 03/16/2020 0800 Gross per 24 hour  Intake 1037.04 ml  Output 10 ml  Net 1027.04 ml   Filed Weights   03/13/20 0808 03/15/20 1020  Weight: 84.4 kg 84.4 kg    Examination:  General exam: No distress.  Elderly male lying in bed.  Respiratory system: Bilateral decreased breath sounds at bases with some scattered crackles Cardiovascular system: S1-S2 heard, rate controlled Gastrointestinal system: Abdomen is nondistended, soft and nontender.  Normal bowel sounds are heard  extremities: Trace bilateral lower extremity edema present; no cyanosis  Data Reviewed: I have personally reviewed following labs and imaging studies  CBC: Recent Labs  Lab 03/13/20 2111 03/14/20 0329 03/14/20 0919 03/15/20 0607 03/16/20 0325  WBC 6.8 5.6 7.8 4.8 3.4*  NEUTROABS  --   --   --  3.2 2.1  HGB 13.1 11.6* 12.6* 9.3* 8.1*  HCT 39.2 34.3* 37.4* 27.5* 23.9*  MCV 100.3* 100.9* 100.3* 100.4* 99.6  PLT 124* 116* 128* 102* 95*    Basic Metabolic Panel: Recent Labs  Lab 03/13/20 0814 03/15/20 0607 03/16/20 0325  NA 141 139 138  K 5.0 4.4 3.8  CL 107 109 107  CO2 26 24 26   GLUCOSE 146* 110* 98  BUN 28* 29* 19  CREATININE 1.38* 1.19 1.23  CALCIUM 9.2 8.2* 8.2*  MG  --  1.8 1.7   GFR: Estimated Creatinine Clearance: 43.1 mL/min (by C-G formula based on SCr of 1.23 mg/dL). Liver Function Tests: Recent Labs  Lab 03/13/20 0814  AST 22  ALT 13  ALKPHOS 53  BILITOT 1.2  PROT 7.2  ALBUMIN 3.8   No results for input(s): LIPASE, AMYLASE in the last 168 hours. No results for input(s): AMMONIA in the last 168 hours. Coagulation Profile: Recent Labs  Lab 03/13/20 1755  INR 1.3*   Cardiac Enzymes: No results for input(s): CKTOTAL, CKMB, CKMBINDEX, TROPONINI in the last 168 hours. BNP (last 3 results) No results for input(s): PROBNP in the last 8760 hours. HbA1C: No results for input(s): HGBA1C in the last 72 hours. CBG: Recent Labs  Lab 03/14/20 0856 03/14/20 1201 03/14/20 1538 03/14/20 2210 03/15/20 0730  GLUCAP 96 121* 120* 110* 114*   Lipid Profile: No results for input(s): CHOL, HDL, LDLCALC, TRIG, CHOLHDL, LDLDIRECT in the last 72 hours. Thyroid Function Tests: No results for input(s): TSH, T4TOTAL, FREET4, T3FREE, THYROIDAB in the last 72 hours. Anemia Panel: No results for input(s): VITAMINB12, FOLATE, FERRITIN, TIBC, IRON, RETICCTPCT in the last 72 hours. Sepsis Labs: No results for input(s): PROCALCITON, LATICACIDVEN in  the last 168 hours.  Recent Results (from the past 240 hour(s))  SARS Coronavirus 2 by RT PCR (hospital order, performed in [facility] hospital lab) Nasopharyngeal Nasopharyngeal Swab     Status: None   Collection Time: 03/13/20  3:13 PM   Specimen: Nasopharyngeal Swab  Result Value Ref Range Status   SARS Coronavirus 2 NEGATIVE NEGATIVE Final    Comment: (NOTE) SARS-CoV-2 target nucleic acids are NOT DETECTED.  The SARS-CoV-2 RNA is generally detectable in  upper and lower respiratory specimens during the acute phase of infection. The lowest concentration of SARS-CoV-2 viral copies this assay can detect is 250 copies / mL. A negative result does not preclude SARS-CoV-2 infection and should not be used as the sole basis for treatment or other patient management decisions.  A negative result may occur with improper specimen collection / handling, submission of specimen other than nasopharyngeal swab, presence of viral mutation(s) within the areas targeted by this assay, and inadequate number of viral copies (<250 copies / mL). A negative result must be combined with clinical observations, patient history, and epidemiological information.  Fact Sheet for Patients:   StrictlyIdeas.no  Fact Sheet for Healthcare Providers: BankingDealers.co.za  This test is not yet approved or  cleared by the [place] FDA and has been authorized for detection and/or diagnosis of SARS-CoV-2 by FDA under an Emergency Use Authorization (EUA).  This EUA will remain in effect (meaning this test can be used) for the duration of the COVID-19 declaration under Section 564(b)(1) of the Act, 21 U.S.C. section 360bbb-3(b)(1), unless the authorization is terminated or revoked sooner.  Performed at [facility], [address], [place], [place] [address]          Radiology Studies: No results found.      Scheduled Meds: . amLODipine  5 mg Oral Daily  . brimonidine  1 drop Left Eye BID   And  . timolol  1 drop Left Eye BID  . calcium carbonate  1 tablet Oral Once per day on Mon Wed Fri  . fesoterodine  4 mg Oral Daily  . latanoprost  1 drop Both Eyes QHS  . pantoprazole (PROTONIX) IV  40 mg Intravenous Q12H  . tamsulosin  0.4 mg Oral Daily   Continuous Infusions:         Aline August, MD [facility] 03/16/2020, 10:03 AM

## 2020-03-16 NOTE — Plan of Care (Signed)
  Problem: Education: Goal: Knowledge of General Education information will improve Description: Including pain rating scale, medication(s)/side effects and non-pharmacologic comfort measures Outcome: Progressing   Problem: Education: Goal: Knowledge of General Education information will improve Description: Including pain rating scale, medication(s)/side effects and non-pharmacologic comfort measures Outcome: Progressing   Problem: Health Behavior/Discharge Planning: Goal: Ability to manage health-related needs will improve Outcome: Progressing   Problem: Clinical Measurements: Goal: Ability to maintain clinical measurements within normal limits will improve Outcome: Progressing Goal: Will remain free from infection Outcome: Progressing Goal: Diagnostic test results will improve Outcome: Progressing Goal: Respiratory complications will improve Outcome: Progressing Goal: Cardiovascular complication will be avoided Outcome: Progressing   Problem: Clinical Measurements: Goal: Will remain free from infection Outcome: Progressing   Problem: Clinical Measurements: Goal: Diagnostic test results will improve Outcome: Progressing   Problem: Clinical Measurements: Goal: Respiratory complications will improve Outcome: Progressing   Problem: Clinical Measurements: Goal: Cardiovascular complication will be avoided Outcome: Progressing

## 2020-03-16 NOTE — Progress Notes (Signed)
GI Inpatient Follow-up Note  Subjective:  Patient seen in follow-up for acute post hemorrhagic anemia. He is s/p EGD and colonoscopy yesterday morning by Dr. Norma Fredrickson where no active bleeding was seen. There was blood noted in the sigmoid colon and rectum and hematin and clots in the cecum, likely a resolving colonic diverticular bleed. Hemoglobin 8.1 this morning. No acute events overnight. No further episodes of rectal bleeding. He is eating pancakes with bananas and tolerating these without difficulty. He had small volume stool last night. Wife present in room with patient.   Scheduled Inpatient Medications:  . amLODipine  5 mg Oral Daily  . brimonidine  1 drop Left Eye BID   And  . timolol  1 drop Left Eye BID  . calcium carbonate  1 tablet Oral Once per day on Mon Wed Fri  . fesoterodine  4 mg Oral Daily  . latanoprost  1 drop Both Eyes QHS  . pantoprazole (PROTONIX) IV  40 mg Intravenous Q12H  . tamsulosin  0.4 mg Oral Daily    Continuous Inpatient Infusions:   . sodium chloride 50 mL/hr at 03/16/20 0649    PRN Inpatient Medications:  acetaminophen, albuterol, docusate sodium, hydrALAZINE, ondansetron (ZOFRAN) IV  Review of Systems: Constitutional: Weight is stable.  Eyes: No changes in vision. ENT: No oral lesions, sore throat.  GI: see HPI.  Heme/Lymph: No easy bruising.  CV: No chest pain.  GU: No hematuria.  Integumentary: No rashes.  Neuro: No headaches.  Psych: No depression/anxiety.  Endocrine: No heat/cold intolerance.  Allergic/Immunologic: No urticaria.  Resp: No cough, SOB.  Musculoskeletal: No joint swelling.    Physical Examination: BP 116/78 (BP Location: Left Arm)   Pulse 70   Temp 98.2 F (36.8 C)   Resp 16   Ht 5\' 7"  (1.702 m)   Wt 84.4 kg   SpO2 97%   BMI 29.14 kg/m   Elderly male sitting up in hospital bed eating breakfast. Wife present in room and helps to answer questions.  Gen: NAD, alert and oriented x 4 HEENT: PEERLA, EOMI, Neck:  supple, no JVD or thyromegaly Chest: CTA bilaterally, no wheezes, crackles, or other adventitious sounds CV: RRR, no m/g/c/r Abd: soft, NT, ND, +BS in all four quadrants; no HSM, guarding, ridigity, or rebound tenderness Ext: no edema, well perfused with 2+ pulses, Skin: no rash or lesions noted Lymph: no LAD  Data: Lab Results  Component Value Date   WBC 3.4 (L) 03/16/2020   HGB 8.1 (L) 03/16/2020   HCT 23.9 (L) 03/16/2020   MCV 99.6 03/16/2020   PLT 95 (L) 03/16/2020   Recent Labs  Lab 03/14/20 0919 03/15/20 0607 03/16/20 0325  HGB 12.6* 9.3* 8.1*   Lab Results  Component Value Date   NA 138 03/16/2020   K 3.8 03/16/2020   CL 107 03/16/2020   CO2 26 03/16/2020   BUN 19 03/16/2020   CREATININE 1.23 03/16/2020   Lab Results  Component Value Date   ALT 13 03/13/2020   AST 22 03/13/2020   ALKPHOS 53 03/13/2020   BILITOT 1.2 03/13/2020   Recent Labs  Lab 03/13/20 1755  INR 1.3*   Assessment/Plan:  86 y/o Caucasian male with a PMH of HTN, HLD, BPH, chronic atrial fibrillation, COPD, chronic diastolic CHF admitted for acute post hemorrhagic anemia  1. Acute post hemorrhagic anemia likely 2/2 diverticular bleed  -s/p EGD and colonoscopy yesterday morning by Dr. 98 with no active bleeding -No recurrent episodes of bleeding. Decrease in  hemoglobin to 8.1 likely hemodilution.  -Continue to monitor H&H. Transfuse for Hgb <7.0. -If signs of recurrent GI bleeding, would advise tagged RBC scan to identify any active bleeding and consult vascular if positive -Advance diet as toelrated -If H&H stable tomorrow and there is still no recurrent bleeding, can discharge patient home -Agree with discontinuing Protonix and IVF hydration -Anticipate discharge tomorrow -He should have his hemoglobin rechecked in 1 week by PCP   Please call with questions or concerns.    Octavia Bruckner, PA-C [facility] [phone] (Cell)

## 2020-03-16 NOTE — Anesthesia Postprocedure Evaluation (Signed)
Anesthesia Post Note  Patient: David Frey  Procedure(s) Performed: ESOPHAGOGASTRODUODENOSCOPY (EGD) WITH PROPOFOL (N/A ) COLONOSCOPY WITH PROPOFOL (N/A )  Patient location during evaluation: Endoscopy Anesthesia Type: General Level of consciousness: awake and alert and oriented Pain management: pain level controlled Vital Signs Assessment: post-procedure vital signs reviewed and stable Respiratory status: spontaneous breathing Cardiovascular status: blood pressure returned to baseline Anesthetic complications: no   No complications documented.   Last Vitals:  Vitals:   03/16/20 1232 03/16/20 1619  BP: 115/64 (!) 100/51  Pulse: 83 77  Resp: 16 16  Temp: 37.3 C 37 C  SpO2: 97% 96%    Last Pain:  Vitals:   03/16/20 1232  TempSrc: Oral  PainSc:                  [name]

## 2020-03-16 NOTE — Evaluation (Signed)
Physical Therapy Evaluation Patient Details Name: David Frey MRN: [id number] DOB: 1930-09-23 Today's Date: 03/16/2020   History of Present Illness  Pt is an 86 y.o. male presenting to hospital 6/17 with bloody stools; pt admitted with rectal bleeding.  S/p colonoscopy and upper GI endoscopy 6/19.  PMH includes a-fib on anti-coagulation, COPD, CHF, LE edema, gout, htn, MI, SSS, dizziness, bradycardia, TKA, pacemaker.  Clinical Impression  Prior to hospital admission, pt was independent with ambulation within home (used 4ww for longer distances); lives at the [place] with his wife.  Currently pt is independent with transfers and CGA with ambulation 200 feet with SPC (pt appearing to fatigue last 60 feet but pt still steady and safe with SPC use during ambulation).  Pt appearing SOB post ambulation (O2 sats 97% or greater on room air) but improved with rest.  HR 92-108 bpm during sessions activities.  Pt would benefit from skilled PT to address noted impairments and functional limitations (see below for any additional details).  Upon hospital discharge, no further PT needs anticipated.    Follow Up Recommendations No PT follow up    Equipment Recommendations  Other (comment) (pt has SPC and 4ww at home already)    Recommendations for Other Services       Precautions / Restrictions Precautions Precautions: Fall Restrictions Weight Bearing Restrictions: No      Mobility  Bed Mobility Overal bed mobility: Modified Independent             General bed mobility comments: Sit to semi-supine without any noted difficulties  Transfers Overall transfer level: Independent Equipment used: None             General transfer comment: fairly strong stand with single UE support  Ambulation/Gait Ambulation/Gait assistance: Min guard Gait Distance (Feet): 200 Feet Assistive device: Straight cane   Gait velocity: mildly decreased   General Gait Details: partial step  through gait pattern; steady with SPC use but appearing to fatigue last 60 feet  Stairs            Wheelchair Mobility    Modified Rankin (Stroke Patients Only)       Balance Overall balance assessment: Needs assistance Sitting-balance support: No upper extremity supported;Feet supported Sitting balance-Leahy Scale: Normal Sitting balance - Comments: steady sitting reaching outside BOS   Standing balance support: No upper extremity supported Standing balance-Leahy Scale: Good Standing balance comment: steady standing brushing teeth at sink                             Pertinent Vitals/Pain Pain Assessment: No/denies pain     Home Living Family/patient expects to be discharged to:: Private residence Living Arrangements: Spouse/significant other Available Help at Discharge: Family;Available 24 hours/day Type of Home: Independent living facility ([facility]) Home Access: Level entry     Home Layout: One [place]: Environmental consultant - 4 wheels;Cane - single point      Prior Function Level of Independence: Independent with assistive device(s)         Comments: No AD in home but uses 4ww for longer distances.  No falls in past 6 months.     Hand Dominance        Extremity/Trunk Assessment   Upper Extremity Assessment Upper Extremity Assessment: Generalized weakness    Lower Extremity Assessment Lower Extremity Assessment: Generalized weakness    Cervical / Trunk Assessment Cervical / Trunk Assessment: Kyphotic (forward head/shoulders)  Communication   Communication: No difficulties  Cognition Arousal/Alertness: Awake/alert Behavior During Therapy: WFL for tasks assessed/performed Overall Cognitive Status: Within Functional Limits for tasks assessed                                        General Comments   Nursing cleared pt for participation in physical therapy.  Pt agreeable to PT session.  Pt's wife present  during session.    Exercises  Ambulation with [facility]   Assessment/Plan    PT Assessment Patient needs continued PT services  PT Problem List Decreased strength;Decreased activity tolerance;Decreased balance;Decreased mobility       PT Treatment Interventions DME instruction;Gait training;Functional mobility training;Therapeutic activities;Therapeutic exercise;Balance training;Patient/family education    PT Goals (Current goals can be found in the Care Plan section)  Acute Rehab PT Goals Patient Stated Goal: to go home PT Goal Formulation: With patient Time For Goal Achievement: 03/30/20 Potential to Achieve Goals: Good    Frequency Min 2X/week   Barriers to discharge        Co-evaluation               AM-PAC PT "6 Clicks" Mobility  Outcome Measure Help needed turning from your back to your side while in a flat bed without using bedrails?: None Help needed moving from lying on your back to sitting on the side of a flat bed without using bedrails?: None Help needed moving to and from a bed to a chair (including a wheelchair)?: None Help needed standing up from a chair using your arms (e.g., wheelchair or bedside chair)?: None Help needed to walk in hospital room?: None Help needed climbing 3-5 steps with a railing? : A Little 6 Click Score: 23    End of Session Equipment Utilized During Treatment: Gait belt Activity Tolerance: Patient tolerated treatment well (although fatigued last 60 feet of ambulation) Patient left: in bed;with call bell/phone within reach;with family/visitor present Nurse Communication: Mobility status;Precautions PT Visit Diagnosis: Other abnormalities of gait and mobility (R26.89);Muscle weakness (generalized) (M62.81)    Time: 9826-4158 PT Time Calculation (min) (ACUTE ONLY): 22 min   Charges:   PT Evaluation $PT Eval Low Complexity: 1 Low PT Treatments $Therapeutic Activity: 8-22 mins       Montia Haslip, PT 03/16/20, 11:08  AM

## 2020-03-17 ENCOUNTER — Encounter: Payer: Self-pay | Admitting: Internal Medicine

## 2020-03-17 LAB — CBC WITH DIFFERENTIAL/PLATELET
Abs Immature Granulocytes: 0.02 10*3/uL (ref 0.00–0.07)
Basophils Absolute: 0 10*3/uL (ref 0.0–0.1)
Basophils Relative: 1 %
Eosinophils Absolute: 0.1 10*3/uL (ref 0.0–0.5)
Eosinophils Relative: 2 %
HCT: 23.7 % — ABNORMAL LOW (ref 39.0–52.0)
Hemoglobin: 8.4 g/dL — ABNORMAL LOW (ref 13.0–17.0)
Immature Granulocytes: 1 %
Lymphocytes Relative: 24 %
Lymphs Abs: 1 10*3/uL (ref 0.7–4.0)
MCH: 34.4 pg — ABNORMAL HIGH (ref 26.0–34.0)
MCHC: 35.4 g/dL (ref 30.0–36.0)
MCV: 97.1 fL (ref 80.0–100.0)
Monocytes Absolute: 0.5 10*3/uL (ref 0.1–1.0)
Monocytes Relative: 11 %
Neutro Abs: 2.6 10*3/uL (ref 1.7–7.7)
Neutrophils Relative %: 61 %
Platelets: 103 10*3/uL — ABNORMAL LOW (ref 150–400)
RBC: 2.44 MIL/uL — ABNORMAL LOW (ref 4.22–5.81)
RDW: 14 % (ref 11.5–15.5)
WBC: 4.1 10*3/uL (ref 4.0–10.5)
nRBC: 0 % (ref 0.0–0.2)

## 2020-03-17 LAB — MAGNESIUM: Magnesium: 1.8 mg/dL (ref 1.7–2.4)

## 2020-03-17 LAB — BASIC METABOLIC PANEL
Anion gap: 5 (ref 5–15)
BUN: 15 mg/dL (ref 8–23)
CO2: 26 mmol/L (ref 22–32)
Calcium: 7.9 mg/dL — ABNORMAL LOW (ref 8.9–10.3)
Chloride: 110 mmol/L (ref 98–111)
Creatinine, Ser: 1.24 mg/dL (ref 0.61–1.24)
GFR calc Af Amer: 60 mL/min — ABNORMAL LOW (ref >60)
GFR calc non Af Amer: 52 mL/min — ABNORMAL LOW (ref >60)
Glucose, Bld: 113 mg/dL — ABNORMAL HIGH (ref 70–99)
Potassium: 3.6 mmol/L (ref 3.5–5.1)
Sodium: 141 mmol/L (ref 135–145)

## 2020-03-17 NOTE — Discharge Summary (Signed)
Physician Discharge Summary  David Frey ZOX:[id number] DOB: [birth year] DOA: 03/13/2020  PCP: Patrice Paradise, MD  Admit date: 03/13/2020 Discharge date: 03/17/2020  Admitted From: Home Disposition: Home  Recommendations for Outpatient Follow-up:  1. Follow up with PCP in 1 week with repeat CBC/BMP 2. Outpatient follow-up with Dr. Kowalski/cardiology.  Patient wants to discuss with Dr. Gwen Pounds regarding Eliquis. 3. Outpatient follow-up with GI if needed 4. Follow up in ED if symptoms worsen or new appear   Home Health: No Equipment/Devices: None  Discharge Condition: Stable CODE STATUS: Partial: Okay with CPR but no intubation  diet recommendation: Heart healthy  Brief/Interim Summary: 86 year old male with history of atrial fibrillation on Eliquis, hypertension, COPD, asthma, gout, chronic kidney disease stage III, chronic diastolic CHF, BPH, pacemaker placement presented with rectal bleeding.  On presentation, hemoglobin was 14.9.  GI was consulted.  During the hospitalization, he had EGD and colonoscopy on 03/15/2020.  Subsequently, diet was restarted.  He is tolerating diet with no further rectal bleeding.  He is hemodynamically stable and GI has cleared him for discharge.  He will be discharged home today with outpatient follow-up with PCP with close follow-up of CBC.  He wants to discuss with Dr. Gwen Pounds regarding Eliquis.  Discharge Diagnoses:   Rectal bleeding -Hemoglobin 14.9 on presentation.  Hemoglobinv8.1 this morning.   -GI following.  Status post EGD and colonoscopy on 03/15/2020.  Colonoscopy showed nonbleeding internal hemorrhoids and blood in the entire colon with moderate diverticulosis but no evidence of diverticular bleeding.  EGD was essentially normal. -No further rectal bleeding since colonoscopy.   IV fluids discontinued.  Currently tolerating soft diet. -Hemoglobin 8.4 today.  GI has cleared the patient for discharge.  GI recommends outpatient follow-up  as needed.  Protonix IV has been discontinued. -Eliquis plan as below. -Discharge patient home today with repeat CBC within a week and follow-up with PCP.  Benign essential hypertension  -Blood pressure stable.  Continue amlodipine.  Outpatient follow-up.  BPH -Continue Flomax  COPD -Stable.    Outpatient follow-up  Chronic diastolic CHF -Echo on 11/23/2018 showed EF of more than 55%.  CHF is compensated.    Outpatient follow-up  Paroxysmal A. fib -Eliquis on hold.  Rate controlled.  Patient wants to discuss with his own cardiologist/Dr. Gwen Pounds regarding further use of Eliquis after discharge.  will keep Eliquis on hold on discharge.  Acute kidney injury on chronic kidney disease stage IIIa -Creatinine 1.38 on presentation.  Baseline creatinine 1.01 on 12/19/2018.  Creatinine 1.24 today.    Off IV fluids.  Outpatient follow-up.  Discharge Instructions  Discharge Instructions    Diet - low sodium heart healthy   Complete by: As directed    Increase activity slowly   Complete by: As directed      Allergies as of 03/17/2020      Reactions   Celecoxib Rash, Other (See Comments)   Hallucination      Medication List    STOP taking these medications   Eliquis 5 MG Tabs tablet Generic drug: apixaban     TAKE these medications   amLODipine 5 MG tablet Commonly known as: NORVASC Take 5 mg by mouth daily.   calcium carbonate 1500 (600 Ca) MG Tabs tablet Commonly known as: OSCAL Take 1 tablet by mouth. Takes 3 times a week (Monday, Wednesday, Saturday)   Combigan 0.2-0.5 % ophthalmic solution Generic drug: brimonidine-timolol Place 1 drop into the left eye 2 (two) times daily.   docusate sodium 100 MG  capsule Commonly known as: COLACE Take 100 mg by mouth 3 (three) times a week. Tuesday, Thursday, Saturday   fesoterodine 4 MG Tb24 tablet Commonly known as: Toviaz Take 1 tablet (4 mg total) by mouth daily.   latanoprost 0.005 % ophthalmic solution Commonly  known as: XALATAN Place 1 drop into both eyes at bedtime.   PREVAGEN PO Take by mouth.   tamsulosin 0.4 MG Caps capsule Commonly known as: FLOMAX Take 1 capsule (0.4 mg total) by mouth daily.       Follow-up Information    Patrice Paradise, MD. Schedule an appointment as soon as possible for a visit in 1 week.   Specialty: Physician Assistant Why: with repeat cbc/bmp Contact information: [address] [facility] [place] [address] [phone]        Lamar Blinks, MD.   Specialty: Cardiology Why: at earliest convenience  Contact information: [address] [facility] [place] [address] [phone]        Stanton Kidney, MD.   Specialty: Gastroenterology Why: as needed Contact information: [phone] [place] [address] [phone]              Allergies  Allergen Reactions  . Celecoxib Rash and Other (See Comments)    Hallucination    Consultations:  Gi   Procedures/Studies: Colonoscopy on 03/15/2020 Impression: - Preparation of the colon was fair. - Non-bleeding internal hemorrhoids. - Blood in the entire examined colon. - Moderate diverticulosis in the entire examined  colon. There was no evidence of diverticular bleeding. - Normal mucosa in the entire examined colon. - The examination was otherwise normal. - No specimens collected. - I suspect the patient has a recent, though hopefully  resolving, sigmoid colonic diverticular bleed. Most of  the blood noted was in the sigmoid colon and rectum,  though some hematin and clots were also noted in the  cecum. I was unable to identify  which of the multiple  diverticula had bled. Therefore, no endoscopic  hemostasis was feasible. Recommendation: - Follow H/H, clinically for rebleeding. trbc scan for significant rebleeding per rectum. Vascular consult will be necessary if we can localize  area of colon bleeding on any follow up testing. Continuing to follow progress closely. I will allow some clear liquids today during  observation and advance as possible once bleeding  resolves. - The findings and recommendations were discussed with   EGD on 03/15/2020 Impression: - Normal esophagus. - 2 cm hiatal hernia. - Normal examined duodenum. - The examination was otherwise normal. - No specimens collected. Recommendation: - Continue present medications.    Subjective: Patient seen and examined at bedside.  He is tolerating soft diet.  He has not had any rectal bleeding overnight.  No fever or vomiting reported.  No worsening abdominal pain.  Feels okay to go home today.  Discharge Exam: Vitals:   03/17/20 0532 03/17/20 0802  BP: 129/77 123/78  Pulse: 83 69  Resp: 18 17  Temp: 97.9 F (36.6 C) 97.8 F (36.6 C)  SpO2: 97% 96%    General: Pt is alert, awake, not in acute distress.  Elderly male sitting on bed. Cardiovascular: rate controlled, S1/S2 + Respiratory: bilateral decreased breath sounds at bases with some scattered crackles Abdominal: Soft, NT, ND, bowel sounds + Extremities: Trace lower extremity edema; no cyanosis    The results of significant diagnostics from this hospitalization (including imaging, microbiology, ancillary and laboratory) are listed below for  reference.  Microbiology: Recent Results (from the past 240 hour(s))  SARS Coronavirus 2 by RT PCR (hospital order, performed in [facility] hospital lab) Nasopharyngeal Nasopharyngeal Swab     Status: None   Collection Time: 03/13/20  3:13 PM   Specimen: Nasopharyngeal Swab  Result Value Ref Range Status   SARS Coronavirus 2 NEGATIVE NEGATIVE Final    Comment: (NOTE) SARS-CoV-2 target nucleic acids are NOT DETECTED.  The SARS-CoV-2 RNA is generally detectable in upper and lower respiratory specimens during the acute phase of infection. The lowest concentration of SARS-CoV-2 viral copies this assay can detect is 250 copies / mL. A negative result does not preclude SARS-CoV-2 infection and should not be used as the sole basis for treatment or other patient management decisions.  A negative result may occur with improper specimen collection / handling, submission of specimen other than nasopharyngeal swab, presence of viral mutation(s) within the areas targeted by this assay, and inadequate number of viral copies (<250 copies / mL). A negative result must be combined with clinical observations, patient history, and epidemiological information.  Fact Sheet for Patients:   BoilerBrush.com.cy  Fact Sheet for Healthcare Providers: https://pope.com/  This test is not yet approved or  cleared by the [place] FDA and has been authorized for detection and/or diagnosis of SARS-CoV-2 by FDA under an Emergency Use Authorization (EUA).  This EUA will remain in effect (meaning this test can be used) for the duration of the COVID-19 declaration under Section 564(b)(1) of the Act, 21 U.S.C. section 360bbb-3(b)(1), unless the authorization is terminated or revoked sooner.  Performed at [facility], [address] [place], [place] [address]      Labs: BNP (last 3 results) Recent Labs    03/13/20 0814  BNP 73.1   Basic  Metabolic Panel: Recent Labs  Lab 03/13/20 0814 03/15/20 0607 03/16/20 0325 03/17/20 0514  NA 141 139 138 141  K 5.0 4.4 3.8 3.6  CL 107 109 107 110  CO2 26 24 26 26   GLUCOSE 146* 110* 98 113*  BUN 28* 29* 19 15  CREATININE 1.38* 1.19 1.23 1.24  CALCIUM 9.2 8.2* 8.2* 7.9*  MG  --  1.8 1.7 1.8   Liver Function Tests: Recent Labs  Lab 03/13/20 0814  AST 22  ALT 13  ALKPHOS 53  BILITOT 1.2  PROT 7.2  ALBUMIN 3.8   No results for input(s): LIPASE, AMYLASE in the last 168 hours. No results for input(s): AMMONIA in the last 168 hours. CBC: Recent Labs  Lab 03/14/20 0329 03/14/20 0919 03/15/20 0607 03/16/20 0325 03/17/20 0514  WBC 5.6 7.8 4.8 3.4* 4.1  NEUTROABS  --   --  3.2 2.1 2.6  HGB 11.6* 12.6* 9.3* 8.1* 8.4*  HCT 34.3* 37.4* 27.5* 23.9* 23.7*  MCV 100.9* 100.3* 100.4* 99.6 97.1  PLT 116* 128* 102* 95* 103*   Cardiac Enzymes: No results for input(s): CKTOTAL, CKMB, CKMBINDEX, TROPONINI in the last 168 hours. BNP: Invalid input(s): POCBNP CBG: Recent Labs  Lab 03/14/20 0856 03/14/20 1201 03/14/20 1538 03/14/20 2210 03/15/20 0730  GLUCAP 96 121* 120* 110* 114*   D-Dimer No results for input(s): DDIMER in the last 72 hours. Hgb A1c No results for input(s): HGBA1C in the last 72 hours. Lipid Profile No results for input(s): CHOL, HDL, LDLCALC, TRIG, CHOLHDL, LDLDIRECT in the last 72 hours. Thyroid function studies No results for input(s): TSH, T4TOTAL, T3FREE, THYROIDAB in the last 72 hours.  Invalid input(s): FREET3 Anemia work up No results  for input(s): VITAMINB12, FOLATE, FERRITIN, TIBC, IRON, RETICCTPCT in the last 72 hours. Urinalysis    Component Value Date/Time   COLORURINE Yellow 12/05/2013 0946   APPEARANCEUR Clear 12/08/2017 1035   LABSPEC 1.023 12/05/2013 0946   PHURINE 5.0 12/05/2013 0946   GLUCOSEU Negative 12/08/2017 1035   GLUCOSEU Negative 12/05/2013 0946   HGBUR 2+ 12/05/2013 0946   BILIRUBINUR Negative 12/08/2017 1035    BILIRUBINUR Negative 12/05/2013 0946   KETONESUR Negative 12/05/2013 0946   PROTEINUR 2+ (A) 12/08/2017 1035   PROTEINUR 30 mg/dL 12/05/2013 0946   NITRITE Negative 12/08/2017 1035   NITRITE Negative 12/05/2013 0946   LEUKOCYTESUR Negative 12/08/2017 1035   LEUKOCYTESUR Negative 12/05/2013 0946   Sepsis Labs Invalid input(s): PROCALCITONIN,  WBC,  LACTICIDVEN Microbiology Recent Results (from the past 240 hour(s))  SARS Coronavirus 2 by RT PCR (hospital order, performed in [place] hospital lab) Nasopharyngeal Nasopharyngeal Swab     Status: None   Collection Time: 03/13/20  3:13 PM   Specimen: Nasopharyngeal Swab  Result Value Ref Range Status   SARS Coronavirus 2 NEGATIVE NEGATIVE Final    Comment: (NOTE) SARS-CoV-2 target nucleic acids are NOT DETECTED.  The SARS-CoV-2 RNA is generally detectable in upper and lower respiratory specimens during the acute phase of infection. The lowest concentration of SARS-CoV-2 viral copies this assay can detect is 250 copies / mL. A negative result does not preclude SARS-CoV-2 infection and should not be used as the sole basis for treatment or other patient management decisions.  A negative result may occur with improper specimen collection / handling, submission of specimen other than nasopharyngeal swab, presence of viral mutation(s) within the areas targeted by this assay, and inadequate number of viral copies (<250 copies / mL). A negative result must be combined with clinical observations, patient history, and epidemiological information.  Fact Sheet for Patients:   StrictlyIdeas.no  Fact Sheet for Healthcare Providers: BankingDealers.co.za  This test is not yet approved or  cleared by the [place] FDA and has been authorized for detection and/or diagnosis of SARS-CoV-2 by FDA under an Emergency Use Authorization (EUA).  This EUA will remain in effect (meaning this test can be  used) for the duration of the COVID-19 declaration under Section 564(b)(1) of the Act, 21 U.S.C. section 360bbb-3(b)(1), unless the authorization is terminated or revoked sooner.  Performed at [facility], [address], [place], [place] [address]      Time coordinating discharge: 35 minutes  SIGNED:   Aline August, MD  [facility] 03/17/2020, 10:15 AM

## 2020-03-17 NOTE — Plan of Care (Signed)
  Problem: Health Behavior/Discharge Planning: Goal: Ability to manage health-related needs will improve Outcome: Progressing   Problem: Clinical Measurements: Goal: Ability to maintain clinical measurements within normal limits will improve Outcome: Progressing Goal: Will remain free from infection Outcome: Progressing Goal: Cardiovascular complication will be avoided Outcome: Progressing   Problem: Activity: Goal: Risk for activity intolerance will decrease Outcome: Progressing   

## 2020-03-17 NOTE — Discharge Instructions (Signed)

## 2020-03-17 NOTE — Care Management Important Message (Signed)
Important Message  Patient Details  Name: David Frey MRN: [id number] Date of Birth: 1929/10/08   Medicare Important Message Given:  Yes     [name] 03/17/2020, 10:54 AM

## 2020-03-28 ENCOUNTER — Telehealth: Payer: Self-pay

## 2020-03-28 NOTE — Telephone Encounter (Signed)
Patient called stating he misplaced his last few pills of his Toviaz bottle and needs an early refill. Called [facility]'s [facility] and spoke with Kathlene November and he states patient is ok to come and fill script now. Patient was notified

## 2020-06-23 ENCOUNTER — Ambulatory Visit: Payer: Medicare Other | Admitting: Dermatology

## 2020-06-25 ENCOUNTER — Ambulatory Visit: Payer: Medicare Other | Admitting: Dermatology

## 2020-12-03 ENCOUNTER — Other Ambulatory Visit: Payer: Self-pay | Admitting: Urology

## 2020-12-03 DIAGNOSIS — N401 Enlarged prostate with lower urinary tract symptoms: Secondary | ICD-10-CM

## 2021-01-15 ENCOUNTER — Ambulatory Visit: Payer: Self-pay | Admitting: Urology

## 2021-01-21 ENCOUNTER — Ambulatory Visit: Payer: Medicare Other | Admitting: Urology

## 2021-02-09 ENCOUNTER — Ambulatory Visit (INDEPENDENT_AMBULATORY_CARE_PROVIDER_SITE_OTHER): Payer: Medicare Other | Admitting: Urology

## 2021-02-09 ENCOUNTER — Other Ambulatory Visit: Payer: Self-pay

## 2021-02-09 ENCOUNTER — Encounter: Payer: Self-pay | Admitting: Urology

## 2021-02-09 VITALS — BP 150/83 | HR 80 | Ht 67.0 in | Wt 175.0 lb

## 2021-02-09 DIAGNOSIS — N401 Enlarged prostate with lower urinary tract symptoms: Secondary | ICD-10-CM

## 2021-02-09 DIAGNOSIS — N5201 Erectile dysfunction due to arterial insufficiency: Secondary | ICD-10-CM

## 2021-02-09 DIAGNOSIS — N5089 Other specified disorders of the male genital organs: Secondary | ICD-10-CM | POA: Diagnosis not present

## 2021-02-09 LAB — BLADDER SCAN AMB NON-IMAGING: Scan Result: 0

## 2021-02-09 NOTE — Progress Notes (Signed)
02/09/2021 3:15 PM   David Frey 1930-04-11 [id number]  Referring provider: Patrice Paradise, MD [address] [facility] [place]  [place] [address]  Chief Complaint  Patient presents with  . Benign Prostatic Hypertrophy    Urologic history: 1.BPH with lower urinary tract symptoms -On tamsulosin/Toviaz -TURP 1985 -Cystoscopy 11/2017 TUR changes with mild adenoma regrowth  2.Microhematuria -Renal ultrasound with simple cyst -Cystoscopy as above. Hypervascularity prostate   HPI: 87 y.o. male presents for annual follow-up.   Since last years visit as noted some increased urgency with occasional loss of minimal urine volume prior to voiding  Nocturia x3  Remains on Toviaz and tamsulosin  Also complains of redness scrotal skin which is helped with powders  He is able to work and has recently started a friendship and states he may be interested in a trial of PDE 5 inhibitors in the future   PMH: Past Medical History:  Diagnosis Date  . Actinic keratosis   . Arthritis, degenerative 10/21/2015  . Asthma without status asthmaticus 12/13/2014  . Atrial fibrillation (HCC)   . Benign fibroma of prostate 12/13/2014  . Benign prostatic hyperplasia with urinary obstruction 10/30/2012  . Blood glucose elevated 11/28/2014  . BP (high blood pressure) 12/13/2014  . Chronic obstructive pulmonary disease (HCC) 12/13/2014  . Chronic systolic heart failure (HCC) 07/09/2014   Overview:  Global ef 45%   . Combined fat and carbohydrate induced hyperlipemia 04/01/2014  . Edema leg 04/01/2014  . Excessive urination at night 10/30/2012  . Gout 12/13/2014  . Hypertension   . Impaired renal function 12/13/2014  . 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 Methicillin susceptible Staphylococcus aureus infection 02/25/2010   Overview:  Methicillin  Susceptible Staphylococcus Aureus Infection   . MI (mitral incompetence) 07/09/2014  . Obstruction to urinary outflow 12/13/2014  . Paroxysmal atrial fibrillation (HCC) 07/09/2014    Surgical History: Past Surgical History:  Procedure Laterality Date  . APPENDECTOMY  1943  . COLONOSCOPY WITH PROPOFOL N/A 03/15/2020   Procedure: COLONOSCOPY WITH PROPOFOL;  Surgeon: Toledo, Boykin Nearing, MD;  Location: [facility] ENDOSCOPY;  Service: Gastroenterology;  Laterality: N/A;  . ESOPHAGOGASTRODUODENOSCOPY (EGD) WITH PROPOFOL N/A 03/15/2020   Procedure: ESOPHAGOGASTRODUODENOSCOPY (EGD) WITH PROPOFOL;  Surgeon: Toledo, Boykin Nearing, MD;  Location: [facility] ENDOSCOPY;  Service: Gastroenterology;  Laterality: N/A;  . HERNIA REPAIR  1993  . PACEMAKER LEADLESS INSERTION N/A 12/18/2018   Procedure: PACEMAKER LEADLESS INSERTION;  Surgeon: Marcina Millard, MD;  Location: [facility] INVASIVE CV LAB;  Service: Cardiovascular;  Laterality: N/A;  . TONSILLECTOMY  1966  . TOTAL KNEE ARTHROPLASTY  2007  . TRANSURETHRAL RESECTION OF PROSTATE  1985    Home Medications:  Allergies as of 02/09/2021      Reactions   Celecoxib Rash, Other (See Comments)   Hallucination      Medication List       Accurate as of Feb 09, 2021  3:15 PM. If you have any questions, ask your nurse or doctor.        amLODipine 5 MG tablet Commonly known as: NORVASC Take 5 mg by mouth daily.   calcium carbonate 1500 (600 Ca) MG Tabs tablet Commonly known as: OSCAL Take 1 tablet by mouth. Takes 3 times a week (Monday, Wednesday, Saturday)   Combigan 0.2-0.5 % ophthalmic solution Generic drug: brimonidine-timolol Place 1 drop into the left eye 2 (two)  times daily.   docusate sodium 100 MG capsule Commonly known as: COLACE Take 100 mg by mouth 3 (three) times a week. Tuesday, Thursday, Saturday   latanoprost 0.005 % ophthalmic solution Commonly known as: XALATAN Place 1 drop into both eyes at bedtime.   PREVAGEN PO Take by mouth.    tamsulosin 0.4 MG Caps capsule Commonly known as: FLOMAX TAKE 1 CAPSULE BY MOUTH EVERY DAY   Toviaz 4 MG Tb24 tablet Generic drug: fesoterodine TAKE 1 TABLET (4 MG TOTAL) BY MOUTH DAILY.       Allergies:  Allergies  Allergen Reactions  . Celecoxib Rash and Other (See Comments)    Hallucination    Family History: Family History  Problem Relation Age of Onset  . Bladder Cancer Neg Hx   . Prostate cancer Neg Hx   . Kidney cancer Neg Hx     Social History:  reports that he has never smoked. He has never used smokeless tobacco. He reports that he does not drink alcohol and does not use drugs.   Physical Exam: BP (!) 150/83   Pulse 80   Ht 5\' 7"  (1.702 m)   Wt 175 lb (79.4 kg)   BMI 27.41 kg/m   Constitutional:  Alert and oriented, No acute distress. HEENT: [place] AT, moist mucus membranes.  Trachea midline, no masses. Cardiovascular: No clubbing, cyanosis, or edema. Respiratory: Normal respiratory effort, no increased work of breathing. GI: Abdomen is soft, nontender, nondistended, no abdominal masses GU: Phallus without lesions.  Testes descended bilateral without masses.  No scrotal skin thickening or redness on today's exam Lymph: No cervical or inguinal lymphadenopathy. Skin: No rashes, bruises or suspicious lesions. Neurologic: Grossly intact, no focal deficits, moving all 4 extremities. Psychiatric: Normal mood and affect.  Laboratory Data:  Urinalysis Dipstick/microscopy negative  Assessment & Plan:    1. Benign prostatic hyperplasia with LUTS symptom details unspecified  Has noted slight increased urgency with small-volume urge incontinence though states this symptom is not bothersome enough that he desires any change in current management  Toviaz/tamsulosin refilled  Bladder scan PVR today 0 mL  Continue annual follow-up  2.  Erectile dysfunction  New problem  No contraindication to PDE 5 inhibitors and he will call back if interested in a  trial  3.  Scrotal erythema  New problem  Recommend trial OTC clotrimazole or miconazole when symptomatic   , MD  [facility] [address] [place], [place] [phone]

## 2021-02-11 ENCOUNTER — Encounter: Payer: Self-pay | Admitting: Urology

## 2021-02-12 ENCOUNTER — Telehealth: Payer: Self-pay | Admitting: *Deleted

## 2021-02-12 NOTE — Telephone Encounter (Signed)
Notified patient as instructed, patient pleased °

## 2021-02-12 NOTE — Telephone Encounter (Signed)
-----   Message from Riki Altes, MD sent at 02/11/2021  7:21 AM EDT ----- Regarding: Scrotal rash Please let patient know would recommend either clotrimazole or miconazole for his scrotal rash which is available over-the-counter.  Thanks

## 2021-02-13 LAB — MICROSCOPIC EXAMINATION
Bacteria, UA: NONE SEEN
Epithelial Cells (non renal): NONE SEEN /hpf (ref 0–10)

## 2021-02-13 LAB — URINALYSIS, COMPLETE
Bilirubin, UA: NEGATIVE
Glucose, UA: NEGATIVE
Ketones, UA: NEGATIVE
Leukocytes,UA: NEGATIVE
Nitrite, UA: NEGATIVE
RBC, UA: NEGATIVE
Specific Gravity, UA: 1.02 (ref 1.005–1.030)
Urobilinogen, Ur: 0.2 mg/dL (ref 0.2–1.0)
pH, UA: 5 (ref 5.0–7.5)

## 2021-12-01 ENCOUNTER — Other Ambulatory Visit: Payer: Self-pay | Admitting: Urology

## 2021-12-01 DIAGNOSIS — N401 Enlarged prostate with lower urinary tract symptoms: Secondary | ICD-10-CM

## 2021-12-13 ENCOUNTER — Other Ambulatory Visit: Payer: Self-pay | Admitting: Urology

## 2021-12-13 DIAGNOSIS — N401 Enlarged prostate with lower urinary tract symptoms: Secondary | ICD-10-CM

## 2021-12-22 ENCOUNTER — Telehealth: Payer: Self-pay | Admitting: *Deleted

## 2021-12-22 NOTE — Telephone Encounter (Signed)
Patient was approved for Toviaz 09/27/221-12/21/2022 ?

## 2021-12-29 ENCOUNTER — Telehealth: Payer: Self-pay | Admitting: Family Medicine

## 2021-12-29 NOTE — Telephone Encounter (Signed)
Patient called and states even though I got him approved for the Lisbeth Ply it is over 200.00. He is wanting to know if there is anything different he could try. It has gotten way to expensive.  ?

## 2021-12-30 NOTE — Telephone Encounter (Signed)
Is Myrbetriq or trospium covered on his plan? ?

## 2021-12-30 NOTE — Telephone Encounter (Signed)
He can get trospium 20 mg twice daily at [facility] with good Rx for $23 per month.  Would recommend he try that initially ?

## 2021-12-31 MED ORDER — TROSPIUM CHLORIDE 20 MG PO TABS: 20.0000 mg | ORAL_TABLET | Freq: Two times a day (BID) | ORAL | 1 refills | Status: DC

## 2021-12-31 NOTE — Telephone Encounter (Signed)
Patient notified and will get the RX from [facility].  ?

## 2022-02-02 ENCOUNTER — Other Ambulatory Visit: Payer: Self-pay | Admitting: Family Medicine

## 2022-02-10 ENCOUNTER — Ambulatory Visit (INDEPENDENT_AMBULATORY_CARE_PROVIDER_SITE_OTHER): Payer: Medicare Other | Admitting: Urology

## 2022-02-10 ENCOUNTER — Encounter: Payer: Self-pay | Admitting: Urology

## 2022-02-10 VITALS — BP 126/78 | HR 80 | Ht 67.0 in | Wt 171.0 lb

## 2022-02-10 DIAGNOSIS — N401 Enlarged prostate with lower urinary tract symptoms: Secondary | ICD-10-CM

## 2022-02-10 LAB — URINALYSIS, COMPLETE
Bilirubin, UA: NEGATIVE
Glucose, UA: NEGATIVE
Ketones, UA: NEGATIVE
Leukocytes,UA: NEGATIVE
Nitrite, UA: NEGATIVE
RBC, UA: NEGATIVE
Specific Gravity, UA: 1.02 (ref 1.005–1.030)
Urobilinogen, Ur: 1 mg/dL (ref 0.2–1.0)
pH, UA: 5.5 (ref 5.0–7.5)

## 2022-02-10 LAB — MICROSCOPIC EXAMINATION: Bacteria, UA: NONE SEEN

## 2022-02-10 LAB — BLADDER SCAN AMB NON-IMAGING: Scan Result: 87

## 2022-02-10 MED ORDER — TROSPIUM CHLORIDE 20 MG PO TABS: 20.0000 mg | ORAL_TABLET | Freq: Two times a day (BID) | ORAL | 1 refills | Status: DC

## 2022-02-10 MED ORDER — TAMSULOSIN HCL 0.4 MG PO CAPS: 0.4000 mg | ORAL_CAPSULE | Freq: Every day | ORAL | 3 refills | Status: DC

## 2022-02-10 NOTE — Progress Notes (Signed)
02/10/2022 1:01 PM   David Frey [birth year] [id number]  Referring provider: Patrice Paradise, MD [address] [facility] [place],  [place] [address]  Chief Complaint  Patient presents with   Benign Prostatic Hypertrophy    Urologic history: 1.  BPH with lower urinary tract symptoms -On tamsulosin/Toviaz -TURP 1985 -Cystoscopy 11/2017 TUR changes with mild adenoma regrowth   2.  Microhematuria -Renal ultrasound with simple cyst -Cystoscopy as above.  Hypervascularity prostate   HPI: David Frey presents for annual follow-up.  Shortly after last year's visit David Frey was paying $200 per month for fesoterodine Was able to get trospium 20 mg through [facility] for ~ $20 per month which has been effective.  Also remains on tamsulosin Stable nocturia x3 Denies dysuria, gross hematuria No flank, abdominal or pelvic pain  PMH: Past Medical History:  Diagnosis Date   Actinic keratosis    Arthritis, degenerative 10/21/2015   Asthma without status asthmaticus 12/13/2014   Atrial fibrillation (HCC)    Benign fibroma of prostate 12/13/2014   Benign prostatic hyperplasia with urinary obstruction 10/30/2012   Blood glucose elevated 11/28/2014   BP (high blood pressure) 12/13/2014   Chronic obstructive pulmonary disease (HCC) 12/13/2014   Chronic systolic heart failure (HCC) 07/09/2014   Overview:  Global ef 45%    Combined fat and carbohydrate induced hyperlipemia 04/01/2014   Edema leg 04/01/2014   Excessive urination at night 10/30/2012   Gout 12/13/2014   Hypertension    Impaired renal function 12/13/2014   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, implant, and graft 05/13/2007   Overview:  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    Methicillin susceptible Staphylococcus aureus infection 02/25/2010   Overview:  Methicillin Susceptible Staphylococcus Aureus Infection    MI (mitral incompetence) 07/09/2014   Obstruction to urinary outflow  12/13/2014   Paroxysmal atrial fibrillation (HCC) 07/09/2014    Surgical History: Past Surgical History:  Procedure Laterality Date   APPENDECTOMY  1943   COLONOSCOPY WITH PROPOFOL N/A 03/15/2020   Procedure: COLONOSCOPY WITH PROPOFOL;  Surgeon: Toledo, Boykin Nearing, MD;  Location: [facility];  Service: Gastroenterology;  Laterality: N/A;   ESOPHAGOGASTRODUODENOSCOPY (EGD) WITH PROPOFOL N/A 03/15/2020   Procedure: ESOPHAGOGASTRODUODENOSCOPY (EGD) WITH PROPOFOL;  Surgeon: Toledo, Boykin Nearing, MD;  Location: [facility];  Service: Gastroenterology;  Laterality: N/A;   HERNIA REPAIR  1993   PACEMAKER LEADLESS INSERTION N/A 12/18/2018   Procedure: PACEMAKER LEADLESS INSERTION;  Surgeon: Marcina Millard, MD;  Location: [facility];  Service: Cardiovascular;  Laterality: N/A;   TONSILLECTOMY  1966   TOTAL KNEE ARTHROPLASTY  2007   TRANSURETHRAL RESECTION OF PROSTATE  1985    Home Medications:  Allergies as of 02/10/2022       Reactions   Celecoxib Rash, Other (See Comments)   Hallucination        Medication List        Accurate as of Feb 10, 2022  1:01 PM. If you have any questions, ask your nurse or doctor.          amLODipine 5 MG tablet Commonly known as: NORVASC Take 5 mg by mouth daily.   calcium carbonate 1500 (600 Ca) MG Tabs tablet Commonly known as: OSCAL Take 1 tablet by mouth. Takes 3 times a week (Monday, Wednesday, Saturday)   Combigan 0.2-0.5 % ophthalmic solution Generic drug: brimonidine-timolol Place 1 drop into the left eye 2 (two) times daily.   docusate sodium 100 MG  capsule Commonly known as: COLACE Take 100 mg by mouth 3 (three) times a week. Tuesday, Thursday, Saturday   latanoprost 0.005 % ophthalmic solution Commonly known as: XALATAN Place 1 drop into both eyes at bedtime.   PREVAGEN PO Take by mouth.   tamsulosin 0.4 MG Caps capsule Commonly known as: FLOMAX TAKE 1 CAPSULE BY MOUTH EVERY DAY   trospium 20 MG  tablet Commonly known as: SANCTURA Take 1 tablet (20 mg total) by mouth 2 (two) times daily.        Allergies:  Allergies  Allergen Reactions   Celecoxib Rash and Other (See Comments)    Hallucination    Family History: Family History  Problem Relation Age of Onset   Bladder Cancer Neg Hx    Prostate cancer Neg Hx    Kidney cancer Neg Hx     Social History:  reports that David Frey has never smoked. David Frey has never used smokeless tobacco. David Frey reports that David Frey does not drink alcohol and does not use drugs.   Physical Exam: BP 126/78   Pulse 80   Ht 5\' 7"  (1.702 m)   Wt 171 lb (77.6 kg)   BMI 26.78 kg/m   Constitutional:  Alert, No acute distress. HEENT: [place] AT, moist mucus membranes.  Trachea midline Respiratory: Normal respiratory effort, no increased work of breathing. Psychiatric: Normal mood and affect.  Laboratory Data:  Urinalysis Dipstick/microscopy negative  Assessment & Plan:    1. Benign prostatic hyperplasia with LUTS  Stable Trospium/tamsulosin refilled  Bladder scan PVR today 87 mL Continue annual follow-up    , MD  [facility] [address], [address] [place], [place] [phone]

## 2022-02-12 ENCOUNTER — Encounter: Payer: Self-pay | Admitting: Urology

## 2022-04-12 ENCOUNTER — Other Ambulatory Visit: Payer: Self-pay | Admitting: Urology

## 2022-06-03 ENCOUNTER — Ambulatory Visit (INDEPENDENT_AMBULATORY_CARE_PROVIDER_SITE_OTHER): Payer: Medicare Other | Admitting: Dermatology

## 2022-06-03 DIAGNOSIS — L570 Actinic keratosis: Secondary | ICD-10-CM | POA: Diagnosis not present

## 2022-06-03 DIAGNOSIS — L578 Other skin changes due to chronic exposure to nonionizing radiation: Secondary | ICD-10-CM

## 2022-06-03 DIAGNOSIS — L821 Other seborrheic keratosis: Secondary | ICD-10-CM

## 2022-06-03 DIAGNOSIS — L820 Inflamed seborrheic keratosis: Secondary | ICD-10-CM | POA: Diagnosis not present

## 2022-06-03 NOTE — Progress Notes (Unsigned)
   Follow-Up Visit   Subjective  David BAZE is a 88 y.o. male who presents for the following: Other (Spots of chest and forehead). The patient has spots, moles and lesions to be evaluated, some may be new or changing and the patient has concerns that these could be cancer.  The following portions of the chart were reviewed this encounter and updated as appropriate:   Tobacco  Allergies  Meds  Problems  Med Hx  Surg Hx  Fam Hx     Review of Systems:  No other skin or systemic complaints except as noted in HPI or Assessment and Plan.  Objective  Well appearing patient in no apparent distress; mood and affect are within normal limits.  A focused examination was performed including face, arms. Relevant physical exam findings are noted in the Assessment and Plan.  Chest x 1, left temple x 1 (2) Erythematous stuck-on, waxy papule or plaque  Forehead (4) Erythematous thin papules/macules with gritty scale.    Assessment & Plan   Actinic Damage - chronic, secondary to cumulative UV radiation exposure/sun exposure over time - diffuse scaly erythematous macules with underlying dyspigmentation - Recommend daily broad spectrum sunscreen SPF 30+ to sun-exposed areas, reapply every 2 hours as needed.  - Recommend staying in the shade or wearing long sleeves, sun glasses (UVA+UVB protection) and wide brim hats (4-inch brim around the entire circumference of the hat). - Call for new or changing lesions.  Seborrheic Keratoses - Stuck-on, waxy, tan-brown papules and/or plaques  - Benign-appearing - Discussed benign etiology and prognosis. - Observe - Call for any changes  Inflamed seborrheic keratosis (2) Chest x 1, left temple x 1  Destruction of lesion - Chest x 1, left temple x 1 Complexity: simple   Destruction method: cryotherapy   Informed consent: discussed and consent obtained   Timeout:  patient name, date of birth, surgical site, and procedure verified Lesion  destroyed using liquid nitrogen: Yes   Region frozen until ice ball extended beyond lesion: Yes   Outcome: patient tolerated procedure well with no complications   Post-procedure details: wound care instructions given    AK (actinic keratosis) (4) Forehead  Destruction of lesion - Forehead Complexity: simple   Destruction method: cryotherapy   Informed consent: discussed and consent obtained   Timeout:  patient name, date of birth, surgical site, and procedure verified Lesion destroyed using liquid nitrogen: Yes   Region frozen until ice ball extended beyond lesion: Yes   Outcome: patient tolerated procedure well with no complications   Post-procedure details: wound care instructions given     Return in about 6 months (around 12/02/2022) for AK follow up, ISK follow up.  I, Joanie Coddington, CMA, am acting as scribe for Armida Sans, MD . Documentation: I have reviewed the above documentation for accuracy and completeness, and I agree with the above.  Armida Sans, MD

## 2022-06-03 NOTE — Patient Instructions (Signed)
Cryotherapy Aftercare  Wash gently with soap and water everyday.   Apply Vaseline and Band-Aid daily until healed.     Due to recent changes in healthcare laws, you may see results of your pathology and/or laboratory studies on MyChart before the doctors have had a chance to review them. We understand that in some cases there may be results that are confusing or concerning to you. Please understand that not all results are received at the same time and often the doctors may need to interpret multiple results in order to provide you with the best plan of care or course of treatment. Therefore, we ask that you please give us 2 business days to thoroughly review all your results before contacting the office for clarification. Should we see a critical lab result, you will be contacted sooner.   If You Need Anything After Your Visit  If you have any questions or concerns for your doctor, please call our main line at [phone] and press option 4 to reach your doctor's medical assistant. If no one answers, please leave a voicemail as directed and we will return your call as soon as possible. Messages left after 4 pm will be answered the following business day.   You may also send us a message via MyChart. We typically respond to MyChart messages within 1-2 business days.  For prescription refills, please ask your pharmacy to contact our office. Our fax number is [phone].  If you have an urgent issue when the clinic is closed that cannot wait until the next business day, you can page your doctor at the number below.    Please note that while we do our best to be available for urgent issues outside of office hours, we are not available 24/7.   If you have an urgent issue and are unable to reach us, you may choose to seek medical care at your doctor's office, retail clinic, urgent care center, or emergency room.  If you have a medical emergency, please immediately call 911 or go to the  emergency department.  Pager Numbers  - Dr. Kowalski: [phone]  - Dr. Moye: [phone]  - Dr. Stewart: [phone]  In the event of inclement weather, please call our main line at [phone] for an update on the status of any delays or closures.  Dermatology Medication Tips: Please keep the boxes that topical medications come in in order to help keep track of the instructions about where and how to use these. Pharmacies typically print the medication instructions only on the boxes and not directly on the medication tubes.   If your medication is too expensive, please contact our office at [phone] option 4 or send us a message through MyChart.   We are unable to tell what your co-pay for medications will be in advance as this is different depending on your insurance coverage. However, we may be able to find a substitute medication at lower cost or fill out paperwork to get insurance to cover a needed medication.   If a prior authorization is required to get your medication covered by your insurance company, please allow us 1-2 business days to complete this process.  Drug prices often vary depending on where the prescription is filled and some pharmacies may offer cheaper prices.  The website www.goodrx.com contains coupons for medications through different pharmacies. The prices here do not account for what the cost may be with help from insurance (it may be cheaper with your insurance), but the website can   give you the price if you did not use any insurance.  - You can print the associated coupon and take it with your prescription to the pharmacy.  - You may also stop by our office during regular business hours and pick up a GoodRx coupon card.  - If you need your prescription sent electronically to a different pharmacy, notify our office through [place] MyChart or by phone at [phone] option 4.     Si Usted Necesita Algo Despus de Su Visita  Tambin puede  enviarnos un mensaje a travs de MyChart. Por lo general respondemos a los mensajes de MyChart en el transcurso de 1 a 2 das hbiles.  Para renovar recetas, por favor pida a su farmacia que se ponga en contacto con nuestra oficina. Nuestro nmero de fax es el [phone].  Si tiene un asunto urgente cuando la clnica est cerrada y que no puede esperar hasta el siguiente da hbil, puede llamar/localizar a su doctor(a) al nmero que aparece a continuacin.   Por favor, tenga en cuenta que aunque hacemos todo lo posible para estar disponibles para asuntos urgentes fuera del horario de oficina, no estamos disponibles las 24 horas del da, los 7 das de la semana.   Si tiene un problema urgente y no puede comunicarse con nosotros, puede optar por buscar atencin mdica  en el consultorio de su doctor(a), en una clnica privada, en un centro de atencin urgente o en una sala de emergencias.  Si tiene una emergencia mdica, por favor llame inmediatamente al 911 o vaya a la sala de emergencias.  Nmeros de bper  - Dr. Kowalski: [phone]  - Dra. Moye: [phone]  - Dra. Stewart: [phone]  En caso de inclemencias del tiempo, por favor llame a nuestra lnea principal al [phone] para una actualizacin sobre el estado de cualquier retraso o cierre.  Consejos para la medicacin en dermatologa: Por favor, guarde las cajas en las que vienen los medicamentos de uso tpico para ayudarle a seguir las instrucciones sobre dnde y cmo usarlos. Las farmacias generalmente imprimen las instrucciones del medicamento slo en las cajas y no directamente en los tubos del medicamento.   Si su medicamento es muy caro, por favor, pngase en contacto con nuestra oficina llamando al [phone] y presione la opcin 4 o envenos un mensaje a travs de MyChart.   No podemos decirle cul ser su copago por los medicamentos por adelantado ya que esto es diferente dependiendo de la cobertura de su seguro.  Sin embargo, es posible que podamos encontrar un medicamento sustituto a menor costo o llenar un formulario para que el seguro cubra el medicamento que se considera necesario.   Si se requiere una autorizacin previa para que su compaa de seguros cubra su medicamento, por favor permtanos de 1 a 2 das hbiles para completar este proceso.  Los precios de los medicamentos varan con frecuencia dependiendo del lugar de dnde se surte la receta y alguna farmacias pueden ofrecer precios ms baratos.  El sitio web www.goodrx.com tiene cupones para medicamentos de diferentes farmacias. Los precios aqu no tienen en cuenta lo que podra costar con la ayuda del seguro (puede ser ms barato con su seguro), pero el sitio web puede darle el precio si no utiliz ningn seguro.  - Puede imprimir el cupn correspondiente y llevarlo con su receta a la farmacia.  - Tambin puede pasar por nuestra oficina durante el horario de atencin regular y recoger una tarjeta de cupones de GoodRx.  -   Si necesita que su receta se enve electrnicamente a una farmacia diferente, informe a nuestra oficina a travs de MyChart de [place] o por telfono llamando al [phone] y presione la opcin 4.  

## 2022-06-08 ENCOUNTER — Encounter: Payer: Self-pay | Admitting: Dermatology

## 2022-06-28 ENCOUNTER — Other Ambulatory Visit: Payer: Self-pay | Admitting: Urology

## 2022-07-02 ENCOUNTER — Telehealth: Payer: Self-pay | Admitting: Urology

## 2022-07-02 ENCOUNTER — Telehealth: Payer: Self-pay | Admitting: Physician Assistant

## 2022-07-02 NOTE — Telephone Encounter (Signed)
Notified patient as instructed,.  

## 2022-07-02 NOTE — Telephone Encounter (Signed)
Opened in error

## 2022-07-02 NOTE — Telephone Encounter (Signed)
Patient stopped in and was requesting a refill of medication listed below. Patient stated he went to his pharmacy and his pharmacy told him that they've reached out to [place] for a refill and have not received a reply back. He stated he has less than weeks supply left.  trospium (SANCTURA) 20 MG tablet  [facility] [address] [place], [place] [employer] [address] Phone:  [phone]  Fax:  [phone]

## 2022-07-14 ENCOUNTER — Telehealth: Payer: Self-pay | Admitting: Urology

## 2022-07-14 NOTE — Telephone Encounter (Signed)
Patient called the office today to report that he is experiencing dizziness after starting a new medication: trospium.  Patient was advised to stop taking the medication and will will contact him with an alternate.  Please advise.

## 2022-07-15 NOTE — Telephone Encounter (Signed)
Talked with patient and he states its not the tamsulosin . Dr. Bernardo Heater states he can stay on Tamsulosin

## 2022-07-15 NOTE — Telephone Encounter (Signed)
Tamsulosin is a much more common cause of dizziness and trospium.  Is he sure it is not the tamsulosin causing his dizziness?

## 2022-08-05 ENCOUNTER — Telehealth: Payer: Self-pay | Admitting: Urology

## 2022-08-05 NOTE — Telephone Encounter (Signed)
Patient states that he has recently stopped taking Trospium because it was causing dizziness. He is asking if Dr. Lonna Cobb will prescribe Gala Murdoch again. He took this before, but stopped because it was too expensive. He is willing to try it again, or if there is anything else that he could try. [facility]acy is [facility] on [address].

## 2022-08-06 NOTE — Telephone Encounter (Signed)
Recommend trial Myrbetriq 25 mg.  He can pick up samples x4 weeks to try

## 2022-08-26 ENCOUNTER — Other Ambulatory Visit: Payer: Self-pay | Admitting: *Deleted

## 2022-08-26 MED ORDER — MIRABEGRON ER 25 MG PO TB24: 25.0000 mg | ORAL_TABLET | Freq: Every day | ORAL | 11 refills | Status: DC

## 2022-08-26 NOTE — Telephone Encounter (Signed)
Pt LMOM Myrbetriq 25 mg samples worked great for him and he would like a new RX sent in to [facility] (if you can use [facility]) if not, send to [facility] [address].

## 2022-08-26 NOTE — Telephone Encounter (Signed)
[facility]betriq sent to [facility]

## 2022-09-03 ENCOUNTER — Telehealth: Payer: Self-pay

## 2022-09-03 DIAGNOSIS — R350 Frequency of micturition: Secondary | ICD-10-CM

## 2022-09-03 MED ORDER — MIRABEGRON ER 25 MG PO TB24: 25.0000 mg | ORAL_TABLET | Freq: Every day | ORAL | 11 refills | Status: DC

## 2022-09-03 NOTE — Telephone Encounter (Signed)
Pt LM on triage line stating that he would like bladder medication sent into [facility]. RX sent.

## 2022-12-02 ENCOUNTER — Ambulatory Visit: Payer: Medicare Other | Admitting: Dermatology

## 2023-01-05 ENCOUNTER — Ambulatory Visit: Payer: Medicare Other | Admitting: Urology

## 2023-01-10 ENCOUNTER — Ambulatory Visit: Payer: Medicare Other | Admitting: Urology

## 2023-02-10 ENCOUNTER — Encounter: Payer: Self-pay | Admitting: Urology

## 2023-02-10 ENCOUNTER — Ambulatory Visit (INDEPENDENT_AMBULATORY_CARE_PROVIDER_SITE_OTHER): Payer: Medicare Other | Admitting: Urology

## 2023-02-10 VITALS — BP 143/85 | HR 76 | Ht 67.0 in | Wt 166.0 lb

## 2023-02-10 DIAGNOSIS — N401 Enlarged prostate with lower urinary tract symptoms: Secondary | ICD-10-CM

## 2023-02-10 DIAGNOSIS — R350 Frequency of micturition: Secondary | ICD-10-CM | POA: Diagnosis not present

## 2023-02-10 LAB — BLADDER SCAN AMB NON-IMAGING: Scan Result: 9

## 2023-02-10 MED ORDER — GEMTESA 75 MG PO TABS: 75.0000 mg | ORAL_TABLET | Freq: Every day | ORAL | 0 refills | Status: DC

## 2023-02-10 MED ORDER — FESOTERODINE FUMARATE ER 4 MG PO TB24: 4.0000 mg | ORAL_TABLET | Freq: Every day | ORAL | 11 refills | Status: DC

## 2023-02-10 NOTE — Progress Notes (Signed)
02/10/2022 2:19 PM   David Frey 1930-07-18 [id number]  Referring provider: Patrice Paradise, MD [address] [facility] [place],  [place] [address]  Chief Complaint  Patient presents with   Benign Prostatic Hypertrophy    Urologic history: 1.  BPH with lower urinary tract symptoms -On tamsulosin/Toviaz -TURP 1985 -Cystoscopy 11/2017 TUR changes with mild adenoma regrowth   2.  Microhematuria -Renal ultrasound with simple cyst -Cystoscopy as above.  Hypervascularity prostate   HPI: 89 y.o. male presents for annual follow-up.  At last year's visit, he was on trospium 20 mg and tamsulosin He called the office in 06/2022, stating he was having dizziness with trospium. He denies dizziness with tamsulosin and stopped taking the trospium. He requested a refill of fesoterodine and Rx was sent early 07/2022 He did not restart Toviaz and could also not tolerate myrbetriq secondary to side effects of tiredness/fatigue Presently, most bothersome symptoms are urgency and nocturia x 3. He is presently on tamsulosin without problems  PMH: Past Medical History:  Diagnosis Date   Actinic keratosis    Arthritis, degenerative 10/21/2015   Asthma without status asthmaticus 12/13/2014   Atrial fibrillation (HCC)    Benign fibroma of prostate 12/13/2014   Benign prostatic hyperplasia with urinary obstruction 10/30/2012   Blood glucose elevated 11/28/2014   BP (high blood pressure) 12/13/2014   Chronic obstructive pulmonary disease (HCC) 12/13/2014   Chronic systolic heart failure (HCC) 07/09/2014   Overview:  Global ef 45%    Combined fat and carbohydrate induced hyperlipemia 04/01/2014   Edema leg 04/01/2014   Excessive urination at night 10/30/2012   Gout 12/13/2014   Hypertension    Impaired renal function 12/13/2014   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, implant, and graft 05/13/2007   Overview:  Infection Due To An Internal Joint  Prosthesis    Methicillin susceptible Staphylococcus aureus infection 02/25/2010   Overview:  Methicillin Susceptible Staphylococcus Aureus Infection    MI (mitral incompetence) 07/09/2014   Obstruction to urinary outflow 12/13/2014   Paroxysmal atrial fibrillation (HCC) 07/09/2014    Surgical History: Past Surgical History:  Procedure Laterality Date   APPENDECTOMY  1943   COLONOSCOPY WITH PROPOFOL N/A 03/15/2020   Procedure: COLONOSCOPY WITH PROPOFOL;  Surgeon: Toledo, Boykin Nearing, MD;  Location: [facility]OS[facility];  Service: Gastroenterology;  Laterality: N/A;   ESOPHAGOGASTRODUODENOSCOPY (EGD) WITH PROPOFOL N/A 03/15/2020   Procedure: ESOPHAGOGASTRODUODENOSCOPY (EGD) WITH PROPOFOL;  Surgeon: Toledo, Boykin Nearing, MD;  Location: [facility]OS[facility];  Service: Gastroenterology;  Laterality: N/A;   HERNIA REPAIR  1993   PACEMAKER LEADLESS INSERTION N/A 12/18/2018   Procedure: PACEMAKER LEADLESS INSERTION;  Surgeon: Marcina Millard, MD;  Location: [facility] INVASIVE CV LAB;  Service: Cardiovascular;  Laterality: N/A;   TONSILLECTOMY  1966   TOTAL KNEE ARTHROPLASTY  2007   TRANSURETHRAL RESECTION OF PROSTATE  1985    Home Medications:  Allergies as of 02/10/2023       Reactions   Celecoxib Rash, Other (See Comments)   Hallucination        Medication List        Accurate as of Feb 10, 2023  2:19 PM. If you have any questions, ask your nurse or doctor.          STOP taking these medications    mirabegron ER 25 MG Tb24 tablet Commonly known as: MYRBETRIQ Stopped by: Riki Altes, MD       TAKE these medications    amLODipine 5  MG tablet Commonly known as: NORVASC Take 5 mg by mouth daily.   calcium carbonate 1500 (600 Ca) MG Tabs tablet Commonly known as: OSCAL Take 1 tablet by mouth. Takes 3 times a week (Monday, Wednesday, Saturday)   Combigan 0.2-0.5 % ophthalmic solution Generic drug: brimonidine-timolol Place 1 drop into the left eye 2 (two) times daily.   docusate  sodium 100 MG capsule Commonly known as: COLACE Take 100 mg by mouth 3 (three) times a week. Tuesday, Thursday, Saturday   fesoterodine 4 MG Tb24 tablet Commonly known as: TOVIAZ Take 1 tablet (4 mg total) by mouth daily. Started by: Riki Altes, MD   Gemtesa 75 MG Tabs Generic drug: Vibegron Take 1 tablet (75 mg total) by mouth daily. Started by: Riki Altes, MD   latanoprost 0.005 % ophthalmic solution Commonly known as: XALATAN Place 1 drop into both eyes at bedtime.   PREVAGEN PO Take by mouth.   tamsulosin 0.4 MG Caps capsule Commonly known as: FLOMAX Take 1 capsule (0.4 mg total) by mouth daily.        Allergies:  Allergies  Allergen Reactions   Celecoxib Rash and Other (See Comments)    Hallucination    Family History: Family History  Problem Relation Age of Onset   Bladder Cancer Neg Hx    Prostate cancer Neg Hx    Kidney cancer Neg Hx     Social History:  reports that he has never smoked. He has never used smokeless tobacco. He reports that he does not drink alcohol and does not use drugs.   Physical Exam: BP (!) 143/85   Pulse 76   Ht 5\' 7"  (1.702 m)   Wt 166 lb (75.3 kg)   BMI 26.00 kg/m   Constitutional:  Alert, No acute distress. HEENT: [place] AT, moist mucus membranes.  Trachea midline Respiratory: Normal respiratory effort, no increased work of breathing. Psychiatric: Normal mood and affect.   Assessment & Plan:    1. Benign prostatic hyperplasia with LUTS  PVR today: 9mL No tamsulosin refill needed Trial of Gemtesa 75 mg daily- Will call back regarding efficacy Will check [facility] price for Toviaz and let patient know Annual follow up  I have reviewed the above documentation for accuracy and completeness, and I agree with the above.   Riki Altes, MD  [facility] [address] [place], [place] [address] [phone]

## 2023-02-28 ENCOUNTER — Other Ambulatory Visit: Payer: Self-pay | Admitting: Urology

## 2023-02-28 DIAGNOSIS — N401 Enlarged prostate with lower urinary tract symptoms: Secondary | ICD-10-CM

## 2023-06-07 ENCOUNTER — Ambulatory Visit: Payer: Medicare Other | Admitting: Dermatology

## 2023-12-11 ENCOUNTER — Other Ambulatory Visit: Payer: Self-pay | Admitting: Urology

## 2023-12-11 DIAGNOSIS — N401 Enlarged prostate with lower urinary tract symptoms: Secondary | ICD-10-CM

## 2024-01-23 ENCOUNTER — Other Ambulatory Visit: Payer: Self-pay | Admitting: Urology

## 2024-02-10 ENCOUNTER — Ambulatory Visit: Payer: Self-pay | Admitting: Urology

## 2024-03-12 ENCOUNTER — Encounter: Payer: Self-pay | Admitting: Dermatology

## 2024-03-12 ENCOUNTER — Ambulatory Visit (INDEPENDENT_AMBULATORY_CARE_PROVIDER_SITE_OTHER): Admitting: Dermatology

## 2024-03-12 DIAGNOSIS — R21 Rash and other nonspecific skin eruption: Secondary | ICD-10-CM | POA: Diagnosis not present

## 2024-03-12 MED ORDER — KETOCONAZOLE 2 % EX SHAM: MEDICATED_SHAMPOO | CUTANEOUS | 6 refills | Status: DC

## 2024-03-12 NOTE — Progress Notes (Signed)
   Follow-Up Visit   Subjective  David Frey is a 89 y.o. male who presents for the following: Pt c/o itchy bumps in the scalp, currently using Head and Shoulders extra strength shampoo, but he hasn't noticed an improvement.  The following portions of the chart were reviewed this encounter and updated as appropriate: medications, allergies, medical history  Review of Systems:  No other skin or systemic complaints except as noted in HPI or Assessment and Plan.  Objective  Well appearing patient in no apparent distress; mood and affect are within normal limits.   A focused examination was performed of the following areas: the face and scalp   Relevant exam findings are noted in the Assessment and Plan.    Assessment & Plan   R temporal scalp, R temple, R frontal scalp with firm cord-like vessel Plan: Possible temporal artheritis (giant cell arteritis) - sent message to PCP to discuss possibility and treatment options if needed.   Rash: Scalp bumps that improve with head and shoulders. Possible seborrheic dermatitis Exam: no focal lesions but patient reports scalp bumps that improve with head and shoulders but recur.   Chronic and persistent condition with duration or expected duration over one year. Condition is symptomatic/ bothersome to patient. Not currently at goal.  Seborrheic Dermatitis is a chronic persistent rash characterized by pinkness and scaling most commonly of the mid face but also can occur on the scalp (dandruff), ears; mid chest, mid back and groin.  It tends to be exacerbated by stress and cooler weather.  People who have neurologic disease may experience new onset or exacerbation of existing seborrheic dermatitis.  The condition is not curable but treatable and can be controlled.  Treatment Plan: Start Ketoconazole 2% shampoo massage into scalp let sit 5 minutes then wash out. Use twice per week.  RASH   Related Medications ketoconazole (NIZORAL) 2 %  shampoo Massage into scalp let sit 5 minutes then wash out. Use twice per week.  Return if symptoms worsen or fail to improve.  Arlinda Lais, CMA, am acting as scribe for Harris Liming, MD .   Documentation: I have reviewed the above documentation for accuracy and completeness, and I agree with the above.  Harris Liming, MD

## 2024-03-12 NOTE — Patient Instructions (Addendum)

## 2024-03-13 ENCOUNTER — Telehealth: Payer: Self-pay | Admitting: Dermatology

## 2024-03-13 NOTE — Telephone Encounter (Signed)
-----   Message ----- From: Genny Kid [facility]) Sent: 03/12/2024   5:10 PM EDT To: Harris Liming, MD [facility]) Subject: RE: concerning R temple and scalp vessel  Thank you for this message and taking care of him. I will have my nurse reach out to him. Thank you! Rexford Catchings, PA-C   ----- Message ----- From: Harris Liming [facility]) Sent: 03/12/2024   2:36 PM EDT To: Genny Kid, PA [facility]) Subject: concerning R temple and scalp vessel  Dear Alfredo Inch, Mennie Stains, PA,  I am a dermatologist who saw your patient for scalp bumps. I noticed his right temple and right frontal scalp have what appears to be a prominent, cord-like vessel. I worry about temporal arteritis. Have you seen this on him or has it been worked up? I recommend an evaluation if not. He is not currently reporting symptoms, which is good.  Thank you, Devorah Fonder, [facility]

## 2024-07-27 ENCOUNTER — Emergency Department
Admission: EM | Admit: 2024-07-27 | Discharge: 2024-07-27 | Disposition: A | Discharge status: Home / Self Care | Source: Home / Self Care | Attending: Emergency Medicine | Admitting: Emergency Medicine

## 2024-07-27 ENCOUNTER — Other Ambulatory Visit: Payer: Self-pay

## 2024-07-27 ENCOUNTER — Emergency Department

## 2024-07-27 DIAGNOSIS — K859 Acute pancreatitis without necrosis or infection, unspecified: Secondary | ICD-10-CM | POA: Diagnosis not present

## 2024-07-27 DIAGNOSIS — J329 Chronic sinusitis, unspecified: Secondary | ICD-10-CM

## 2024-07-27 DIAGNOSIS — R519 Headache, unspecified: Secondary | ICD-10-CM | POA: Insufficient documentation

## 2024-07-27 DIAGNOSIS — I10 Essential (primary) hypertension: Secondary | ICD-10-CM | POA: Insufficient documentation

## 2024-07-27 LAB — URINALYSIS, COMPLETE (UACMP) WITH MICROSCOPIC
Bilirubin Urine: NEGATIVE
Glucose, UA: NEGATIVE mg/dL
Hgb urine dipstick: NEGATIVE
Ketones, ur: NEGATIVE mg/dL
Leukocytes,Ua: NEGATIVE
Nitrite: NEGATIVE
Protein, ur: 100 mg/dL — AB
Specific Gravity, Urine: 1.013 (ref 1.005–1.030)
pH: 6 (ref 5.0–8.0)

## 2024-07-27 LAB — COMPREHENSIVE METABOLIC PANEL WITH GFR
ALT: 10 U/L (ref 0–44)
AST: 20 U/L (ref 15–41)
Albumin: 3.5 g/dL (ref 3.5–5.0)
Alkaline Phosphatase: 72 U/L (ref 38–126)
Anion gap: 14 (ref 5–15)
BUN: 21 mg/dL (ref 8–23)
CO2: 25 mmol/L (ref 22–32)
Calcium: 9.2 mg/dL (ref 8.9–10.3)
Chloride: 102 mmol/L (ref 98–111)
Creatinine, Ser: 1.03 mg/dL (ref 0.61–1.24)
GFR, Estimated: >60 mL/min (ref >60)
Glucose, Bld: 110 mg/dL — ABNORMAL HIGH (ref 70–99)
Potassium: 3.8 mmol/L (ref 3.5–5.1)
Sodium: 141 mmol/L (ref 135–145)
Total Bilirubin: 1.3 mg/dL — ABNORMAL HIGH (ref 0.0–1.2)
Total Protein: 8.6 g/dL — ABNORMAL HIGH (ref 6.5–8.1)

## 2024-07-27 LAB — CBC
HCT: 43.6 % (ref 39.0–52.0)
Hemoglobin: 14.4 g/dL (ref 13.0–17.0)
MCH: 32.1 pg (ref 26.0–34.0)
MCHC: 33 g/dL (ref 30.0–36.0)
MCV: 97.3 fL (ref 80.0–100.0)
Platelets: 141 K/uL — ABNORMAL LOW (ref 150–400)
RBC: 4.48 MIL/uL (ref 4.22–5.81)
RDW: 14 % (ref 11.5–15.5)
WBC: 7 K/uL (ref 4.0–10.5)
nRBC: 0 % (ref 0.0–0.2)

## 2024-07-27 MED ORDER — AMOXICILLIN-POT CLAVULANATE 875-125 MG PO TABS: 1.0000 | ORAL_TABLET | Freq: Two times a day (BID) | ORAL | 0 refills | Status: DC

## 2024-07-27 NOTE — ED Provider Notes (Signed)
 [facility] Provider Note    Event Date/Time   First MD Initiated Contact with Patient 07/27/24 0830     (approximate)  History   Chief Complaint: headache  HPI  David Frey is a 89 y.o. male with a past medical history of hypertension, paroxysmal atrial fibrillation, presents to the emergency department for a headache/head pain.  According to the patient he woke up around 430 this morning, states and he was trying to get out of bed he felt a pain or pop behind his right ear in the back of his head.  Patient states he laid back down, however when he tried to get up again he felt that same pain.  Patient lives at an assisted living facility he was concerned so he called the nurse and they called EMS to bring him to the emergency department.  EMS initially stated patient had right-sided deficits such as decreased vision in the right eye.  Patient clarifies that that has been ongoing for at least 2 years.  He is awake alert oriented x 4 gives a great history.  He denies any weakness or numbness of any arm or leg denies any visual changes today.  Patient's only symptom is pain in the back of his head but states that has also resolved since arriving to the emergency department.  Physical Exam   Triage Vital Signs: ED Triage Vitals  Encounter Vitals Group     BP      Girls Systolic BP Percentile      Girls Diastolic BP Percentile      Boys Systolic BP Percentile      Boys Diastolic BP Percentile      Pulse      Resp      Temp      Temp src      SpO2      Weight      Height      Head Circumference      Peak Flow      Pain Score      Pain Loc      Pain Education      Exclude from Growth Chart     Most recent vital signs: There were no vitals filed for this visit.  General: Awake, no distress.  CV:  Good peripheral perfusion.  Regular rate and rhythm  Resp:  Normal effort.  Equal breath sounds bilaterally.  Abd:  No distention.  Soft, nontender.  No  rebound or guarding. Other:  Patient has great grip strength bilaterally 5/5 motor in all extremities.  Slight right sided ptosis however again states this is chronic and no other cranial nerve deficits seen on evaluation.   ED Results / Procedures / Treatments   EKG  EKG viewed and interpreted by myself shows a sinus rhythm at 73 bpm with a widened QRS, left axis deviation, borderline PR prolongation otherwise reassuring intervals, nonspecific ST changes.  Morphology consistent with left bundle branch block.  RADIOLOGY  I have reviewed interpret the CT head images.  No obvious bleed seen on my evaluation. Radiology has read the CT scan as negative for acute intracranial abnormality however there is right sided sinus opacification.  Will cover with antibiotics with caution.   MEDICATIONS ORDERED IN ED: Medications - No data to display   IMPRESSION / MDM / ASSESSMENT AND PLAN / ED COURSE  I reviewed the triage vital signs and the nursing notes.  Patient's presentation is most consistent with acute  presentation with potential threat to life or bodily function.  Patient presents emergency department for headache/head pain that occurred this morning described as a sharp pain when he tried to get up out of bed.  Currently the patient appears well he denies any symptoms.  Reassuring physical exam.  Reassuring neurological exam.  Will obtain CT imaging of the head labs and EKG.  Daughter should be arriving shortly for further history.  Patient's workup overall reassuring.  CBC shows no acute findings, chemistry is negative.  CT scan of the head negative for intracranial abnormality but he does have what appears to be right sinusitis.  Will cover with an antibiotic as a precaution.  Patient remained symptom-free in the emergency department throughout his stay.  Daughter is here with the patient.  She is agreeable to this plan as well.    FINAL CLINICAL IMPRESSION(S) / ED DIAGNOSES    Headache   Note:  This document was prepared using Dragon voice recognition software and may include unintentional dictation errors.   Dorothyann Drivers, MD 07/27/24 1036

## 2024-07-27 NOTE — ED Triage Notes (Signed)
 Patient to ED via [facility] from [facility]. Patient states around 0400 he heard a pop in his L ear and had pain on the L side of his head. Patient states the pain went away but when he got up he states he didn't feel stable. The facility also states that the vision in his R eye was worse than normal but patient states he has had the worsening vision in his R eye.

## 2024-07-27 NOTE — Discharge Instructions (Addendum)
 Please take antibiotic as prescribed for the next 7 days.  Return to the emergency department for any return of/worsening headache, any weakness or numbness of any arm or leg, confusion slurred speech or any other symptom personally concerning to yourself.

## 2024-07-30 ENCOUNTER — Emergency Department

## 2024-07-30 ENCOUNTER — Inpatient Hospital Stay: Admission: EM | Admit: 2024-07-30 | Discharge: 2024-08-27 | DRG: 438 | Disposition: E | Source: Skilled Nursing Facility

## 2024-07-30 ENCOUNTER — Other Ambulatory Visit: Payer: Self-pay

## 2024-07-30 ENCOUNTER — Inpatient Hospital Stay

## 2024-07-30 DIAGNOSIS — E785 Hyperlipidemia, unspecified: Secondary | ICD-10-CM | POA: Diagnosis present

## 2024-07-30 DIAGNOSIS — Z7901 Long term (current) use of anticoagulants: Secondary | ICD-10-CM | POA: Diagnosis not present

## 2024-07-30 DIAGNOSIS — R6521 Severe sepsis with septic shock: Secondary | ICD-10-CM | POA: Diagnosis not present

## 2024-07-30 DIAGNOSIS — I4819 Other persistent atrial fibrillation: Secondary | ICD-10-CM | POA: Diagnosis present

## 2024-07-30 DIAGNOSIS — N189 Chronic kidney disease, unspecified: Secondary | ICD-10-CM | POA: Diagnosis present

## 2024-07-30 DIAGNOSIS — I4892 Unspecified atrial flutter: Secondary | ICD-10-CM | POA: Diagnosis present

## 2024-07-30 DIAGNOSIS — K851 Biliary acute pancreatitis without necrosis or infection: Secondary | ICD-10-CM | POA: Diagnosis not present

## 2024-07-30 DIAGNOSIS — K8309 Other cholangitis: Secondary | ICD-10-CM | POA: Diagnosis present

## 2024-07-30 DIAGNOSIS — I2489 Other forms of acute ischemic heart disease: Secondary | ICD-10-CM | POA: Diagnosis present

## 2024-07-30 DIAGNOSIS — Z515 Encounter for palliative care: Secondary | ICD-10-CM

## 2024-07-30 DIAGNOSIS — E872 Acidosis, unspecified: Secondary | ICD-10-CM | POA: Diagnosis not present

## 2024-07-30 DIAGNOSIS — J810 Acute pulmonary edema: Secondary | ICD-10-CM | POA: Diagnosis present

## 2024-07-30 DIAGNOSIS — I495 Sick sinus syndrome: Secondary | ICD-10-CM | POA: Diagnosis present

## 2024-07-30 DIAGNOSIS — K859 Acute pancreatitis without necrosis or infection, unspecified: Principal | ICD-10-CM | POA: Diagnosis present

## 2024-07-30 DIAGNOSIS — I468 Cardiac arrest due to other underlying condition: Secondary | ICD-10-CM | POA: Diagnosis not present

## 2024-07-30 DIAGNOSIS — Z1152 Encounter for screening for COVID-19: Secondary | ICD-10-CM

## 2024-07-30 DIAGNOSIS — Z711 Person with feared health complaint in whom no diagnosis is made: Secondary | ICD-10-CM | POA: Diagnosis not present

## 2024-07-30 DIAGNOSIS — J4489 Other specified chronic obstructive pulmonary disease: Secondary | ICD-10-CM | POA: Diagnosis present

## 2024-07-30 DIAGNOSIS — I5022 Chronic systolic (congestive) heart failure: Secondary | ICD-10-CM | POA: Diagnosis present

## 2024-07-30 DIAGNOSIS — K831 Obstruction of bile duct: Secondary | ICD-10-CM | POA: Diagnosis present

## 2024-07-30 DIAGNOSIS — H9190 Unspecified hearing loss, unspecified ear: Secondary | ICD-10-CM | POA: Diagnosis present

## 2024-07-30 DIAGNOSIS — N401 Enlarged prostate with lower urinary tract symptoms: Secondary | ICD-10-CM | POA: Diagnosis present

## 2024-07-30 DIAGNOSIS — I447 Left bundle-branch block, unspecified: Secondary | ICD-10-CM | POA: Diagnosis present

## 2024-07-30 DIAGNOSIS — R578 Other shock: Secondary | ICD-10-CM | POA: Diagnosis present

## 2024-07-30 DIAGNOSIS — G9341 Metabolic encephalopathy: Secondary | ICD-10-CM | POA: Diagnosis not present

## 2024-07-30 DIAGNOSIS — J9601 Acute respiratory failure with hypoxia: Secondary | ICD-10-CM | POA: Diagnosis present

## 2024-07-30 DIAGNOSIS — Z9079 Acquired absence of other genital organ(s): Secondary | ICD-10-CM

## 2024-07-30 DIAGNOSIS — Z66 Do not resuscitate: Secondary | ICD-10-CM | POA: Diagnosis present

## 2024-07-30 DIAGNOSIS — I4891 Unspecified atrial fibrillation: Secondary | ICD-10-CM | POA: Diagnosis not present

## 2024-07-30 DIAGNOSIS — Z888 Allergy status to other drugs, medicaments and biological substances status: Secondary | ICD-10-CM

## 2024-07-30 DIAGNOSIS — I130 Hypertensive heart and chronic kidney disease with heart failure and stage 1 through stage 4 chronic kidney disease, or unspecified chronic kidney disease: Secondary | ICD-10-CM | POA: Diagnosis present

## 2024-07-30 DIAGNOSIS — E663 Overweight: Secondary | ICD-10-CM | POA: Diagnosis present

## 2024-07-30 DIAGNOSIS — D696 Thrombocytopenia, unspecified: Secondary | ICD-10-CM | POA: Diagnosis not present

## 2024-07-30 DIAGNOSIS — A419 Sepsis, unspecified organism: Secondary | ICD-10-CM | POA: Diagnosis not present

## 2024-07-30 DIAGNOSIS — I161 Hypertensive emergency: Secondary | ICD-10-CM | POA: Diagnosis not present

## 2024-07-30 DIAGNOSIS — N179 Acute kidney failure, unspecified: Secondary | ICD-10-CM | POA: Diagnosis not present

## 2024-07-30 DIAGNOSIS — D631 Anemia in chronic kidney disease: Secondary | ICD-10-CM | POA: Diagnosis present

## 2024-07-30 DIAGNOSIS — I700 Atherosclerosis of aorta: Secondary | ICD-10-CM | POA: Diagnosis present

## 2024-07-30 DIAGNOSIS — Z79899 Other long term (current) drug therapy: Secondary | ICD-10-CM

## 2024-07-30 DIAGNOSIS — Z604 Social exclusion and rejection: Secondary | ICD-10-CM | POA: Diagnosis present

## 2024-07-30 DIAGNOSIS — H5461 Unqualified visual loss, right eye, normal vision left eye: Secondary | ICD-10-CM | POA: Diagnosis present

## 2024-07-30 DIAGNOSIS — Z45018 Encounter for adjustment and management of other part of cardiac pacemaker: Secondary | ICD-10-CM

## 2024-07-30 DIAGNOSIS — Z6828 Body mass index (BMI) 28.0-28.9, adult: Secondary | ICD-10-CM

## 2024-07-30 DIAGNOSIS — R39198 Other difficulties with micturition: Secondary | ICD-10-CM | POA: Diagnosis present

## 2024-07-30 DIAGNOSIS — I480 Paroxysmal atrial fibrillation: Secondary | ICD-10-CM | POA: Diagnosis not present

## 2024-07-30 DIAGNOSIS — Z539 Procedure and treatment not carried out, unspecified reason: Secondary | ICD-10-CM | POA: Diagnosis not present

## 2024-07-30 DIAGNOSIS — Z96659 Presence of unspecified artificial knee joint: Secondary | ICD-10-CM | POA: Diagnosis present

## 2024-07-30 DIAGNOSIS — Z7189 Other specified counseling: Secondary | ICD-10-CM | POA: Diagnosis not present

## 2024-07-30 DIAGNOSIS — R188 Other ascites: Secondary | ICD-10-CM | POA: Diagnosis present

## 2024-07-30 LAB — TROPONIN I (HIGH SENSITIVITY)
Troponin I (High Sensitivity): 35 ng/L — ABNORMAL HIGH (ref <18)
Troponin I (High Sensitivity): 36 ng/L — ABNORMAL HIGH (ref <18)
Troponin I (High Sensitivity): 64 ng/L — ABNORMAL HIGH (ref <18)
Troponin I (High Sensitivity): 69 ng/L — ABNORMAL HIGH (ref <18)

## 2024-07-30 LAB — COMPREHENSIVE METABOLIC PANEL WITH GFR
ALT: 116 U/L — ABNORMAL HIGH (ref 0–44)
AST: 266 U/L — ABNORMAL HIGH (ref 15–41)
Albumin: 3.1 g/dL — ABNORMAL LOW (ref 3.5–5.0)
Alkaline Phosphatase: 173 U/L — ABNORMAL HIGH (ref 38–126)
Anion gap: 12 (ref 5–15)
BUN: 24 mg/dL — ABNORMAL HIGH (ref 8–23)
CO2: 27 mmol/L (ref 22–32)
Calcium: 9 mg/dL (ref 8.9–10.3)
Chloride: 99 mmol/L (ref 98–111)
Creatinine, Ser: 1.15 mg/dL (ref 0.61–1.24)
GFR, Estimated: 60 mL/min — ABNORMAL LOW (ref >60)
Glucose, Bld: 113 mg/dL — ABNORMAL HIGH (ref 70–99)
Potassium: 3.9 mmol/L (ref 3.5–5.1)
Sodium: 138 mmol/L (ref 135–145)
Total Bilirubin: 4 mg/dL — ABNORMAL HIGH (ref 0.0–1.2)
Total Protein: 8.5 g/dL — ABNORMAL HIGH (ref 6.5–8.1)

## 2024-07-30 LAB — RESP PANEL BY RT-PCR (RSV, FLU A&B, COVID)  RVPGX2
Influenza A by PCR: NEGATIVE
Influenza B by PCR: NEGATIVE
Resp Syncytial Virus by PCR: NEGATIVE
SARS Coronavirus 2 by RT PCR: NEGATIVE

## 2024-07-30 LAB — PROTIME-INR
INR: 1.5 — ABNORMAL HIGH (ref 0.8–1.2)
Prothrombin Time: 18.6 s — ABNORMAL HIGH (ref 11.4–15.2)

## 2024-07-30 LAB — CBC
HCT: 43.1 % (ref 39.0–52.0)
Hemoglobin: 13.9 g/dL (ref 13.0–17.0)
MCH: 32 pg (ref 26.0–34.0)
MCHC: 32.3 g/dL (ref 30.0–36.0)
MCV: 99.1 fL (ref 80.0–100.0)
Platelets: 161 K/uL (ref 150–400)
RBC: 4.35 MIL/uL (ref 4.22–5.81)
RDW: 14 % (ref 11.5–15.5)
WBC: 7.8 K/uL (ref 4.0–10.5)
nRBC: 0 % (ref 0.0–0.2)

## 2024-07-30 LAB — MRSA NEXT GEN BY PCR, NASAL: MRSA by PCR Next Gen: NOT DETECTED

## 2024-07-30 LAB — LIPASE, BLOOD: Lipase: >2800 U/L — ABNORMAL HIGH (ref 11–51)

## 2024-07-30 LAB — LIPID PANEL
Cholesterol: 91 mg/dL (ref 0–200)
HDL: 36 mg/dL — ABNORMAL LOW (ref >40)
LDL Cholesterol: 47 mg/dL (ref 0–99)
Total CHOL/HDL Ratio: 2.5 ratio
Triglycerides: 42 mg/dL (ref <150)
VLDL: 8 mg/dL (ref 0–40)

## 2024-07-30 LAB — GLUCOSE, CAPILLARY: Glucose-Capillary: 105 mg/dL — ABNORMAL HIGH (ref 70–99)

## 2024-07-30 LAB — TSH: TSH: 1.103 u[IU]/mL (ref 0.350–4.500)

## 2024-07-30 LAB — BRAIN NATRIURETIC PEPTIDE: B Natriuretic Peptide: 404.2 pg/mL — ABNORMAL HIGH (ref 0.0–100.0)

## 2024-07-30 MED ORDER — SODIUM CHLORIDE 0.9% FLUSH
3.0000 mL | Freq: Two times a day (BID) | INTRAVENOUS | Status: DC
  Administered 2024-07-30 – 2024-08-03 (×8): 3 mL via INTRAVENOUS

## 2024-07-30 MED ORDER — ONDANSETRON HCL 4 MG PO TABS
4.0000 mg | ORAL_TABLET | Freq: Four times a day (QID) | ORAL | Status: DC | PRN
Start: 2024-07-30 — End: 2024-08-03

## 2024-07-30 MED ORDER — APIXABAN 5 MG PO TABS
5.0000 mg | ORAL_TABLET | Freq: Two times a day (BID) | ORAL | Status: DC
  Administered 2024-07-31: 5 mg via ORAL
  Filled 2024-07-30: qty 1

## 2024-07-30 MED ORDER — APOAEQUORIN 10 MG PO CAPS
ORAL_CAPSULE | Freq: Every day | ORAL | Status: DC
Start: 2024-07-31 — End: 2024-07-30

## 2024-07-30 MED ORDER — BISACODYL 5 MG PO TBEC: 5.0000 mg | DELAYED_RELEASE_TABLET | Freq: Every day | ORAL | Status: DC | PRN

## 2024-07-30 MED ORDER — HYDROCODONE-ACETAMINOPHEN 5-325 MG PO TABS: 1.0000 | ORAL_TABLET | ORAL | Status: DC | PRN

## 2024-07-30 MED ORDER — IOHEXOL 350 MG/ML SOLN
100.0000 mL | Freq: Once | INTRAVENOUS | Status: AC | PRN
  Administered 2024-07-30: 100 mL via INTRAVENOUS

## 2024-07-30 MED ORDER — LATANOPROST 0.005 % OP SOLN
1.0000 [drp] | Freq: Every day | OPHTHALMIC | Status: DC
  Administered 2024-07-31 – 2024-08-02 (×3): 1 [drp] via OPHTHALMIC
  Filled 2024-07-30: qty 2.5

## 2024-07-30 MED ORDER — TRAZODONE HCL 50 MG PO TABS
25.0000 mg | ORAL_TABLET | Freq: Every evening | ORAL | Status: DC | PRN
Start: 2024-07-30 — End: 2024-08-03

## 2024-07-30 MED ORDER — BRIMONIDINE TARTRATE-TIMOLOL 0.2-0.5 % OP SOLN: 1.0000 [drp] | Freq: Two times a day (BID) | OPHTHALMIC | Status: DC

## 2024-07-30 MED ORDER — TIMOLOL MALEATE 0.5 % OP SOLN
1.0000 [drp] | Freq: Two times a day (BID) | OPHTHALMIC | Status: DC
  Administered 2024-07-31 – 2024-08-02 (×6): 1 [drp] via OPHTHALMIC
  Filled 2024-07-30: qty 5

## 2024-07-30 MED ORDER — ACETAMINOPHEN 650 MG RE SUPP: 650.0000 mg | Freq: Four times a day (QID) | RECTAL | Status: DC | PRN

## 2024-07-30 MED ORDER — ONDANSETRON HCL 4 MG/2ML IJ SOLN
4.0000 mg | Freq: Four times a day (QID) | INTRAMUSCULAR | Status: DC | PRN
Start: 2024-07-30 — End: 2024-08-03

## 2024-07-30 MED ORDER — FESOTERODINE FUMARATE ER 4 MG PO TB24
4.0000 mg | ORAL_TABLET | Freq: Every day | ORAL | Status: DC
  Administered 2024-07-31 – 2024-08-01 (×2): 4 mg via ORAL
  Filled 2024-07-30 (×2): qty 1

## 2024-07-30 MED ORDER — IPRATROPIUM-ALBUTEROL 0.5-2.5 (3) MG/3ML IN SOLN
3.0000 mL | Freq: Four times a day (QID) | RESPIRATORY_TRACT | Status: DC | PRN
Start: 2024-07-30 — End: 2024-08-03

## 2024-07-30 MED ORDER — MORPHINE SULFATE (PF) 2 MG/ML IV SOLN
2.0000 mg | Freq: Once | INTRAVENOUS | Status: AC
  Administered 2024-07-30: 2 mg via INTRAVENOUS
  Filled 2024-07-30: qty 1

## 2024-07-30 MED ORDER — AMLODIPINE BESYLATE 5 MG PO TABS: 2.5000 mg | ORAL_TABLET | Freq: Every day | ORAL | Status: DC

## 2024-07-30 MED ORDER — ONDANSETRON HCL 4 MG/2ML IJ SOLN
4.0000 mg | Freq: Once | INTRAMUSCULAR | Status: AC
  Administered 2024-07-30: 4 mg via INTRAVENOUS
  Filled 2024-07-30 (×2): qty 2

## 2024-07-30 MED ORDER — IPRATROPIUM-ALBUTEROL 0.5-2.5 (3) MG/3ML IN SOLN
3.0000 mL | Freq: Once | RESPIRATORY_TRACT | Status: AC
  Administered 2024-07-30: 3 mL via RESPIRATORY_TRACT
  Filled 2024-07-30: qty 3

## 2024-07-30 MED ORDER — MORPHINE SULFATE (PF) 2 MG/ML IV SOLN: 1.0000 mg | Freq: Four times a day (QID) | INTRAVENOUS | Status: DC | PRN

## 2024-07-30 MED ORDER — CALCIUM CARBONATE ANTACID 500 MG PO CHEW
1500.0000 mg | CHEWABLE_TABLET | ORAL | Status: DC
  Administered 2024-08-01: 600 mg via ORAL
  Filled 2024-07-30: qty 3

## 2024-07-30 MED ORDER — ORAL CARE MOUTH RINSE: 15.0000 mL | OROMUCOSAL | Status: DC | PRN

## 2024-07-30 MED ORDER — SENNOSIDES-DOCUSATE SODIUM 8.6-50 MG PO TABS
1.0000 | ORAL_TABLET | Freq: Every evening | ORAL | Status: DC | PRN
Start: 2024-07-30 — End: 2024-08-01

## 2024-07-30 MED ORDER — TAMSULOSIN HCL 0.4 MG PO CAPS
0.4000 mg | ORAL_CAPSULE | Freq: Every day | ORAL | Status: DC
  Administered 2024-07-31 – 2024-08-01 (×2): 0.4 mg via ORAL
  Filled 2024-07-30 (×2): qty 1

## 2024-07-30 MED ORDER — ENOXAPARIN SODIUM 40 MG/0.4ML IJ SOSY: 40.0000 mg | PREFILLED_SYRINGE | INTRAMUSCULAR | Status: DC

## 2024-07-30 MED ORDER — BRIMONIDINE TARTRATE 0.2 % OP SOLN
1.0000 [drp] | Freq: Two times a day (BID) | OPHTHALMIC | Status: DC
  Administered 2024-07-31 – 2024-08-02 (×6): 1 [drp] via OPHTHALMIC
  Filled 2024-07-30 (×2): qty 5

## 2024-07-30 MED ORDER — ACETAMINOPHEN 325 MG PO TABS: 650.0000 mg | ORAL_TABLET | Freq: Four times a day (QID) | ORAL | Status: DC | PRN

## 2024-07-30 MED ORDER — LACTATED RINGERS IV SOLN: INTRAVENOUS | Status: AC

## 2024-07-30 MED ORDER — NITROGLYCERIN IN D5W 200-5 MCG/ML-% IV SOLN
0.0000 ug/min | INTRAVENOUS | Status: DC
  Administered 2024-07-30: 80 ug/min via INTRAVENOUS
  Administered 2024-07-30: 100 ug/min via INTRAVENOUS
  Administered 2024-07-30: 60 ug/min via INTRAVENOUS
  Filled 2024-07-30 (×2): qty 250

## 2024-07-30 MED ORDER — GADOBUTROL 1 MMOL/ML IV SOLN
8.0000 mL | Freq: Once | INTRAVENOUS | Status: AC | PRN
  Administered 2024-07-30: 8 mL via INTRAVENOUS

## 2024-07-30 NOTE — Progress Notes (Signed)
 PHARMACIST - PHYSICIAN ORDER COMMUNICATION  CONCERNING: P&T Medication Policy on Herbal Medications  DESCRIPTION:  This patient's order for:  Prevagen capsules   has been noted.  This product(s) is classified as an "herbal" or natural product. Due to a lack of definitive safety studies or FDA approval, nonstandard manufacturing practices, plus the potential risk of unknown drug-drug interactions while on inpatient medications, the Pharmacy and Therapeutics Committee does not permit the use of "herbal" or natural products of this type within [facility].   ACTION TAKEN: The pharmacy department is unable to verify this order at this time and your patient has been informed of this safety policy. Please reevaluate patient's clinical condition at discharge and address if the herbal or natural product(s) should be resumed at that time.

## 2024-07-30 NOTE — Progress Notes (Signed)
  Device system confirmed  , to be MRI conditional, with implant date > 6 weeks ago, and no evidence of abandoned or epicardial leads in review of most recent CXR  Device last cleared by EP Provider:   Erasmo Riddle 07/30/2024)  Clearance is good through for 1 year as long as parameters remain stable at time of check. If pt undergoes a cardiac device procedure during that time, they should be re-cleared.   Tachy-therapies to be programmed off if applicable with device back to pre-MRI settings after completion of exam.  [employer] - Programming recommendation received through [employer] App/Tablet  Marijo FORBES Schlossman, RT  07/30/2024 3:54 PM

## 2024-07-30 NOTE — ED Provider Notes (Signed)
 [facility] Provider Note    Event Date/Time   First MD Initiated Contact with Patient 07/30/24 1233     (approximate)   History   Chest Pain  Pt to ED via [facility] from [facility] assisted [facility]. Pt complains of 10 out of 10 substernal chest pain that started upon waking from a nap. Per EMS pt was diaphoretic on arrival. EMS gave 3 sublingual nitroglycerin and 324 ASA. EMS reports pt was hypertensive at 206/123. Pt reports nausea on ED arrival.    HPI David Frey is a 89 y.o. male PMH asthma, hypertension, CHF, COPD, hyperlipidemia, CKD, chronic A-fib with sick sinus syndrome with pacemaker presents for evaluation of chest pain - Started on 1045 this morning while at home.  Substernal.  Severe.  Constant symptoms.  Associated with notable shortness of breath. - No history of DVT/PE, no recent surgery/patient/travel, no leg swelling - Not on home oxygen - Had otherwise been in usual state of health - Does have history of hypertension, has taken all of his regular blood pressure medications today.  Notes systolic blood pressure is usually around 110.        Physical Exam   Triage Vital Signs: ED Triage Vitals  Encounter Vitals Group     BP 07/30/24 1231 (!) 194/117     Girls Systolic BP Percentile --      Girls Diastolic BP Percentile --      Boys Systolic BP Percentile --      Boys Diastolic BP Percentile --      Pulse Rate 07/30/24 1231 90     Resp 07/30/24 1231 20     Temp 07/30/24 1231 97.9 F (36.6 C)     Temp Source 07/30/24 1231 Oral     SpO2 07/30/24 1233 93 %     Weight 07/30/24 1234 176 lb 5.9 oz (80 kg)     Height 07/30/24 1234 5' 6 (1.676 m)     Head Circumference --      Peak Flow --      Pain Score 07/30/24 1234 0     Pain Loc --      Pain Education --      Exclude from Growth Chart --     Most recent vital signs: Vitals:   07/30/24 1351 07/30/24 1436  BP: 131/76 113/63  Pulse:  86  Resp:  16  Temp:    SpO2:  95%      General: Awake, notably tachypneic, appears ill CV:  Good peripheral perfusion. RRR, RP 2+ Resp:  Normal effort.  Somewhat coarse breath sounds bilateral lower lung fields, diminished breath sounds bilaterally, no wheezing appreciated Abd:  No distention. Nontender to deep palpation throughout Other:  No significant lower extremity edema appreciated   ED Results / Procedures / Treatments   Labs (all labs ordered are listed, but only abnormal results are displayed) Labs Reviewed  COMPREHENSIVE METABOLIC PANEL WITH GFR - Abnormal; Notable for the following components:      Result Value   Glucose, Bld 113 (*)    BUN 24 (*)    Total Protein 8.5 (*)    Albumin 3.1 (*)    AST 266 (*)    ALT 116 (*)    Alkaline Phosphatase 173 (*)    Total Bilirubin 4.0 (*)    GFR, Estimated 60 (*)    All other components within normal limits  LIPASE, BLOOD - Abnormal; Notable for the following components:   Lipase >  2,800 (*)    All other components within normal limits  PROTIME-INR - Abnormal; Notable for the following components:   Prothrombin Time 18.6 (*)    INR 1.5 (*)    All other components within normal limits  BRAIN NATRIURETIC PEPTIDE - Abnormal; Notable for the following components:   B Natriuretic Peptide 404.2 (*)    All other components within normal limits  TROPONIN I (HIGH SENSITIVITY) - Abnormal; Notable for the following components:   Troponin I (High Sensitivity) 35 (*)    All other components within normal limits  TROPONIN I (HIGH SENSITIVITY) - Abnormal; Notable for the following components:   Troponin I (High Sensitivity) 36 (*)    All other components within normal limits  RESP PANEL BY RT-PCR (RSV, FLU A&B, COVID)  RVPGX2  CBC     EKG  Ecg = A-fib versus sinus rhythm, no gross ST elevation or depression, T wave inversions V4-V6, frequent PVCs, rate 96, left axis deviation   RADIOLOGY Radiology interpreted by myself radiology reports reviewed.  Notable for  cholelithiasis.  No other acute pathology identified.    PROCEDURES:  Critical Care performed: Yes, see critical care procedure note(s)  .Critical Care  Performed by: Clarine Ozell LABOR, MD Authorized by: Clarine Ozell LABOR, MD   Critical care provider statement:    Critical care time (minutes):  45   Critical care time was exclusive of:  Separately billable procedures and treating other patients   Critical care was necessary to treat or prevent imminent or life-threatening deterioration of the following conditions:  Respiratory failure   Critical care was time spent personally by me on the following activities:  Development of treatment plan with patient or surrogate, discussions with consultants, evaluation of patient's response to treatment, examination of patient, ordering and review of laboratory studies, ordering and review of radiographic studies, ordering and performing treatments and interventions, pulse oximetry, re-evaluation of patient's condition and review of old charts   I assumed direction of critical care for this patient from another provider in my specialty: no     Care discussed with: admitting provider      MEDICATIONS ORDERED IN ED: Medications  nitroGLYCERIN 50 mg in dextrose  5 % 250 mL (0.2 mg/mL) infusion (100 mcg/min Intravenous New Bag/Given 07/30/24 1315)  ipratropium-albuterol  (DUONEB) 0.5-2.5 (3) MG/3ML nebulizer solution 3 mL (3 mLs Nebulization Given 07/30/24 1315)  ipratropium-albuterol  (DUONEB) 0.5-2.5 (3) MG/3ML nebulizer solution 3 mL (3 mLs Nebulization Given 07/30/24 1315)  iohexol (OMNIPAQUE) 350 MG/ML injection 100 mL (100 mLs Intravenous Contrast Given 07/30/24 1339)  ondansetron  (ZOFRAN ) injection 4 mg (4 mg Intravenous Given 07/30/24 1541)  morphine (PF) 2 MG/ML injection 2 mg (2 mg Intravenous Given 07/30/24 1542)     IMPRESSION / MDM / ASSESSMENT AND PLAN / ED COURSE  I reviewed the triage vital signs and the nursing notes.                               DDX/MDM/AP: Differential diagnosis includes, but is not limited to, hypertensive emergency/flash pulmonary edema, considered but doubt asthma/COPD contributing, consider ACS, aortic dissection, less likely pulmonary embolism given patient is already anticoagulated.  Consider upper abdominal pathology contributing to her chest discomfort.  Consider underlying pneumonia, CHF exacerbation, viral syndrome including COVID-19 or influenza.  Plan: - Labs  -chest x-ray - EKG - Nitroglycerin drip - Pain control - BiPAP  Patient's presentation is most consistent with acute presentation with  potential threat to life or bodily function.  The patient is on the cardiac monitor to evaluate for evidence of arrhythmia and/or significant heart rate changes.  ED course below.  Blood pressure well-controlled with nitroglycerin drip, symptoms improved, doing well on BiPAP.  Did develop some nausea.  Subsequently found to have new hyperbilirubinemia with transaminitis as well as markedly elevated lipase, findings are new when compared to 3 days ago.  Troponin mildly elevated, stable on repeat.  Discussed with GI who recommends MRCP, will consider whether ERCP may be indicated.  No indication for antibiotics at this time.  Admitted to hospitalist service.  Do suspect some element of hypertensive emergency contributing to presentation today as well.  Does continue to have mild oxygen requirement.  No wheezing, good airflow after 2 DuoNebs.  Clinical Course as of 07/30/24 1634  Mon Jul 30, 2024  1257 Cbc wnl [MM]  1348 Troponin mildly elevated [MM]  1348 CMP with new notable transaminitis and elevated bilirubin to 4 [MM]  1419 Lipase(!): >2,800 [MM]  1436 CTA CAP: IMPRESSION: 1. No evidence of acute aortic syndrome or aneurysm. 2. Cholelithiasis. 3. Aortic atherosclerosis (ICD10-I70.0). Coronary artery calcification. 4. Enlarged pulmonic trunk, indicative of pulmonary arterial hypertension.   [MM]   1445 Discussed findings with family  ERCP would be within goals of care if indicated  Paging GI to discuss [MM]  1515 D/w Dr. Chapman of GI - Requests MRCP if able with pacemaker, if unable can consider escalation directly to ERCP  [MM]    Clinical Course User Index [MM] Clarine Ozell LABOR, MD     FINAL CLINICAL IMPRESSION(S) / ED DIAGNOSES   Final diagnoses:  Acute pancreatitis, unspecified complication status, unspecified pancreatitis type  Hyperbilirubinemia  Acute respiratory failure with hypoxia [facility])  Hypertensive emergency     Rx / DC Orders   ED Discharge Orders     None        Note:  This document was prepared using Dragon voice recognition software and may include unintentional dictation errors.   Clarine Ozell LABOR, MD 07/30/24 [phone]

## 2024-07-30 NOTE — ED Notes (Signed)
Report given to ICU RN Debra

## 2024-07-30 NOTE — H&P (Signed)
 History and Physical   [facility] [place] @ [facility] Admission History and Physical [employer], D.O.    Patient Name: David Frey MR#: [id number] Date of Birth: Feb 03, 1930 Date of Admission: 07/30/2024  Referring MD/NP/PA: Dr. Clarine Primary Care Physician: Lenon Layman ORN, MD  Chief Complaint:  Chief Complaint  Patient presents with   Chest Pain    HPI: David Frey is a 89 y.o. male [name] with a known history of asthma A-fib BPH hypertension COPD chronic systolic heart failure with an EF 45%, hyperlipidemia, hypertension, CKD presents to the emergency department for evaluation of chest pain.  Patient came in today complaining of 10 out of 10 substernal chest pain with associated shortness of breath that started when he woke up from a nap today.     He received 3 sublingual nitroglycerin and 1 full dose aspirin and was found to be hypertensive by EMS.  On arrival to the emergency department he was found to be hypoxic to 88% on room air  Patient denies fevers/chills, weakness, dizziness, N/V/C/D, abdominal pain, dysuria/frequency, changes in mental status.    Otherwise there has been no change in status. Patient has been taking medication as prescribed and there has been no recent change in medication or diet.  No recent antibiotics.  There has been no recent illness, hospitalizations, travel or sick contacts.    EMS/ED Course: Patient received morphine, Zofran , Duoneb, NTG, . Medical admission has been requested for further management of acute pancreatitis, possibly choledocholithiasis, severe uncontrolled HTN.  Review of Systems:  CONSTITUTIONAL: No fever/chills, fatigue, weakness, weight gain/loss, headache. EYES: No blurry or double vision. ENT: No tinnitus, postnasal drip, redness or soreness of the oropharynx. RESPIRATORY: Positive shortness of breath.  No cough,, wheeze.  No hemoptysis.  CARDIOVASCULAR: Positive chest pain.  No  palpitations, syncope, orthopnea. No lower extremity edema.  GASTROINTESTINAL: No nausea, vomiting, abdominal pain, diarrhea, constipation.  No hematemesis, melena or hematochezia. GENITOURINARY: No dysuria, frequency, hematuria. ENDOCRINE: No polyuria or nocturia. No heat or cold intolerance. HEMATOLOGY: No anemia, bruising, bleeding. INTEGUMENTARY: No rashes, ulcers, lesions. MUSCULOSKELETAL: No arthritis, gout. NEUROLOGIC: No numbness, tingling, ataxia, seizure-type activity, weakness. PSYCHIATRIC: No anxiety, depression, insomnia.   Past Medical History:  Diagnosis Date   Actinic keratosis    Arthritis, degenerative 10/21/2015   Asthma without status asthmaticus 12/13/2014   Atrial fibrillation (HCC)    Benign fibroma of prostate 12/13/2014   Benign prostatic hyperplasia with urinary obstruction 10/30/2012   Blood glucose elevated 11/28/2014   BP (high blood pressure) 12/13/2014   Chronic obstructive pulmonary disease (HCC) 12/13/2014   Chronic systolic heart failure (HCC) 07/09/2014   Overview:  Global ef 45%    Combined fat and carbohydrate induced hyperlipemia 04/01/2014   Edema leg 04/01/2014   Excessive urination at night 10/30/2012   Gout 12/13/2014   Hypertension    Impaired renal function 12/13/2014   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, implant, and graft 05/13/2007   Overview:  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    Methicillin susceptible Staphylococcus aureus infection 02/25/2010   Overview:  Methicillin Susceptible Staphylococcus Aureus Infection    MI (mitral incompetence) 07/09/2014   Obstruction to urinary outflow 12/13/2014   Paroxysmal atrial fibrillation (HCC) 07/09/2014    Past Surgical History:  Procedure Laterality Date   APPENDECTOMY  09/27/1941   COLONOSCOPY WITH PROPOFOL  N/A 03/15/2020   Procedure: COLONOSCOPY WITH PROPOFOL ;  Surgeon: Toledo, Ladell POUR, MD;  Location: [facility] ENDOSCOPY;  Service: Gastroenterology;   Laterality: N/A;   ESOPHAGOGASTRODUODENOSCOPY (EGD) WITH PROPOFOL  N/A 03/15/2020   Procedure: ESOPHAGOGASTRODUODENOSCOPY (EGD) WITH PROPOFOL ;  Surgeon: Toledo, Ladell POUR, MD;  Location: [facility] ENDOSCOPY;  Service: Gastroenterology;  Laterality: N/A;   HERNIA REPAIR  09/28/1991   PACEMAKER LEADLESS INSERTION N/A 12/18/2018   Procedure: PACEMAKER LEADLESS INSERTION;  Surgeon: Ammon Blunt, MD;  Location: [facility] INVASIVE CV LAB;  Service: Cardiovascular;  Laterality: N/A;   TONSILLECTOMY  09/27/1964   TOTAL KNEE ARTHROPLASTY  09/27/2005   TRANSURETHRAL RESECTION OF PROSTATE  09/28/1983     reports that he has never smoked. He has never used smokeless tobacco. He reports that he does not drink alcohol and does not use drugs.  Allergies  Allergen Reactions   Celecoxib Rash and Other (See Comments)    Hallucination    Family History  Problem Relation Age of Onset   Bladder Cancer Neg Hx    Prostate cancer Neg Hx    Kidney cancer Neg Hx     Prior to Admission medications   Medication Sig Start Date End Date Taking? Authorizing Provider  amLODipine  (NORVASC ) 5 MG tablet Take 5 mg by mouth daily. 12/02/18   [provider]  amoxicillin-clavulanate (AUGMENTIN) 875-125 MG tablet Take 1 tablet by mouth 2 (two) times daily. 07/27/24   Dorothyann Drivers, MD  Apoaequorin (PREVAGEN PO) Take by mouth.    [provider]  calcium  carbonate (OSCAL) 1500 (600 Ca) MG TABS tablet Take 1 tablet by mouth. Takes 3 times a week (Monday, Wednesday, Saturday)    [provider]  COMBIGAN  0.2-0.5 % ophthalmic solution Place 1 drop into the left eye 2 (two) times daily. 11/14/18   [provider]  docusate sodium  (COLACE) 100 MG capsule Take 100 mg by mouth 3 (three) times a week. Tuesday, Thursday, Saturday    [provider]  fesoterodine  (TOVIAZ ) 4 MG TB24 tablet TAKE ONE TABLET BY MOUTH ONE TIME DAILY 01/23/24   Twylla Glendia BROCKS, MD  ketoconazole  (NIZORAL ) 2 %  shampoo Massage into scalp let sit 5 minutes then wash out. Use twice per week. 03/12/24   Claudene Lehmann, MD  latanoprost  (XALATAN ) 0.005 % ophthalmic solution Place 1 drop into both eyes at bedtime.     [provider]  tamsulosin  (FLOMAX ) 0.4 MG CAPS capsule TAKE 1 CAPSULE BY MOUTH EVERY DAY 12/12/23   Stoioff, Glendia BROCKS, MD  Vibegron  (GEMTESA ) 75 MG TABS Take 1 tablet (75 mg total) by mouth daily. 02/10/23   Twylla Glendia BROCKS, MD    Physical Exam: Vitals:   07/30/24 1324 07/30/24 1331 07/30/24 1351 07/30/24 1436  BP: (!) 141/73 115/66 131/76 113/63  Pulse:    86  Resp:    16  Temp:      TempSrc:      SpO2:    95%  Weight:      Height:        GENERAL: 89 y.o.-year-old white male patient, lying in the bed in no acute distress.  Pleasant and cooperative.   HEENT: Head atraumatic, normocephalic. Pupils equal. Mucus membranes moist. NECK: Supple. No JVD. CHEST: Normal respiratory effort, diminished breath sounds at the bases CARDIOVASCULAR: S1, S2 normal.  Sounds irregular.  No murmurs, rubs, or gallops ABDOMEN: Soft, nondistended, nontender. No rebound, guarding, rigidity.  EXTREMITIES: No pedal edema, NEUROLOGIC: The patient is alert and oriented x 3    Labs on Admission:  CBC: Recent Labs  Lab 07/27/24 0839 07/30/24 1230  WBC 7.0 7.8  HGB 14.4 13.9  HCT 43.6 43.1  MCV 97.3 99.1  PLT 141* 161   Basic Metabolic Panel: Recent Labs  Lab 07/27/24 0839 07/30/24 1230  NA 141 138  K 3.8 3.9  CL 102 99  CO2 25 27  GLUCOSE 110* 113*  BUN 21 24*  CREATININE 1.03 1.15  CALCIUM  9.2 9.0   GFR: Estimated Creatinine Clearance: 40.8 mL/min (by C-G formula based on SCr of 1.15 mg/dL). Liver Function Tests: Recent Labs  Lab 07/27/24 0839 07/30/24 1230  AST 20 266*  ALT 10 116*  ALKPHOS 72 173*  BILITOT 1.3* 4.0*  PROT 8.6* 8.5*  ALBUMIN 3.5 3.1*   Recent Labs  Lab 07/30/24 1230  LIPASE >2,800*   No results for input(s): AMMONIA in the last 168  hours. Coagulation Profile: Recent Labs  Lab 07/30/24 1230  INR 1.5*   Cardiac Enzymes: No results for input(s): CKTOTAL, CKMB, CKMBINDEX, TROPONINI in the last 168 hours. BNP (last 3 results) No results for input(s): PROBNP in the last 8760 hours. HbA1C: No results for input(s): HGBA1C in the last 72 hours. CBG: No results for input(s): GLUCAP in the last 168 hours. Lipid Profile: No results for input(s): CHOL, HDL, LDLCALC, TRIG, CHOLHDL, LDLDIRECT in the last 72 hours. Thyroid Function Tests: No results for input(s): TSH, T4TOTAL, FREET4, T3FREE, THYROIDAB in the last 72 hours. Anemia Panel: No results for input(s): VITAMINB12, FOLATE, FERRITIN, TIBC, IRON, RETICCTPCT in the last 72 hours. Urine analysis:    Component Value Date/Time   COLORURINE YELLOW (A) 07/27/2024 1013   APPEARANCEUR CLEAR (A) 07/27/2024 1013   APPEARANCEUR Clear 02/10/2022 1305   LABSPEC 1.013 07/27/2024 1013   LABSPEC 1.023 12/05/2013 0946   PHURINE 6.0 07/27/2024 1013   GLUCOSEU NEGATIVE 07/27/2024 1013   GLUCOSEU Negative 12/05/2013 0946   HGBUR NEGATIVE 07/27/2024 1013   BILIRUBINUR NEGATIVE 07/27/2024 1013   BILIRUBINUR Negative 02/10/2022 1305   BILIRUBINUR Negative 12/05/2013 0946   KETONESUR NEGATIVE 07/27/2024 1013   PROTEINUR 100 (A) 07/27/2024 1013   NITRITE NEGATIVE 07/27/2024 1013   LEUKOCYTESUR NEGATIVE 07/27/2024 1013   LEUKOCYTESUR Negative 12/05/2013 0946   Sepsis Labs: @LABRCNTIP (procalcitonin:4,lacticidven:4) )No results found for this or any previous visit (from the past 240 hours).   Radiological Exams on Admission: CT Angio Chest/Abd/Pel for Dissection W and/or Wo Contrast Result Date: 07/30/2024 CLINICAL DATA:  Chest pain, shortness of breath. EXAM: CT ANGIOGRAPHY CHEST, ABDOMEN AND PELVIS TECHNIQUE: Non-contrast CT of the chest was initially obtained. Multidetector CT imaging through the chest, abdomen and pelvis was  performed using the standard protocol during bolus administration of intravenous contrast. Multiplanar reconstructed images and MIPs were obtained and reviewed to evaluate the vascular anatomy. RADIATION DOSE REDUCTION: This exam was performed according to the departmental dose-optimization program which includes automated exposure control, adjustment of the mA and/or kV according to patient size and/or use of iterative reconstruction technique. CONTRAST:  100mL OMNIPAQUE IOHEXOL 350 MG/ML SOLN COMPARISON:  None Available. FINDINGS: CTA CHEST FINDINGS Cardiovascular: No evidence of an aortic intramural hematoma on precontrast imaging or aortic dissection or aneurysm on postcontrast imaging. Atherosclerotic calcification of the aorta, aortic valve and coronary arteries. Heart is enlarged. No pericardial effusion. Enlarged pulmonic trunk. Mediastinum/Nodes: No pathologically enlarged mediastinal, hilar or axillary lymph nodes. Air in the esophagus can be seen with dysmotility. Lungs/Pleura: Mild dependent atelectasis bilaterally. Pulmonary nodules measure 6 mm or less in size. No specific follow-up recommended in a patient of this age. No pleural fluid. Airway is unremarkable. Musculoskeletal: Degenerative  changes in the spine. Review of the MIP images confirms the above findings. CTA ABDOMEN AND PELVIS FINDINGS VASCULAR Aorta: No aneurysm or dissection.  Atherosclerotic calcification. Celiac: Ostial calcification without significant luminal narrowing. Widely patent. SMA: Ostial calcification.  Widely patent. Renals: Ostial calcification bilaterally.  Widely patent. IMA: Widely patent. Inflow: Minimally ectatic but not aneurysmal. Atherosclerotic calcification. No dissection. Veins: Poorly evaluated due to lack of opacification. Review of the MIP images confirms the above findings. NON-VASCULAR Hepatobiliary: Liver is unremarkable. Gallstones. No biliary ductal dilatation. Pancreas: Negative. Spleen: Negative.  Adrenals/Urinary Tract: Adrenal glands are unremarkable. Low-attenuation lesions in the kidneys. No specific follow-up necessary. Renal cortical scarring and thinning bilaterally. Ureters are decompressed. Small right lateral bladder diverticulum. Stomach/Bowel: Small hiatal hernia. Stomach is otherwise unremarkable. Duodenal diverticula are incidentally noted. Small bowel and colon are otherwise unremarkable. Appendix is reportedly absent. Lymphatic: No pathologically enlarged lymph nodes. Reproductive: Prostate is visualized. Other: Small bilateral inguinal hernias contain fat. Small umbilical hernia contains fat. No free fluid. Musculoskeletal: Degenerative changes in the spine. Osteopenia. Mild levoconvex scoliosis. Review of the MIP images confirms the above findings. IMPRESSION: 1. No evidence of acute aortic syndrome or aneurysm. 2. Cholelithiasis. 3. Aortic atherosclerosis (ICD10-I70.0). Coronary artery calcification. 4. Enlarged pulmonic trunk, indicative of pulmonary arterial hypertension. Electronically Signed   By: Newell Eke M.D.   On: 07/30/2024 14:32   DG Chest Portable 1 View Result Date: 07/30/2024 CLINICAL DATA:  Substernal chest pain and shortness of breath. EXAM: PORTABLE CHEST 1 VIEW COMPARISON:  09/12/2009. FINDINGS: Trachea is midline. Heart is enlarged. Lead less pacemaker projects over the left heart. Lungs are low in volume but clear. No pleural fluid. IMPRESSION: No acute findings. Electronically Signed   By: Newell Eke M.D.   On: 07/30/2024 14:22    EKG: Ventricular paced rhythm at 94 bpm  Assessment/Plan  This is a 89 y.o. male with a history of asthma A-fib s/p PPM BPH hypertension COPD chronic systolic heart failure with an EF 45%, hyperlipidemia, hypertension, CKD  now being admitted with:  #. Acute pancreatitis with elevated lipase as well as elevated LFTs including AST ALT and total bili.  Poss choledocho - Admit stepdown - NPO - MRCP pending.  If MR is  incompatible with pacemaker then would proceed right to ERCP - Dr. Jinny consulted by EDP  #. Severe uncontrolled HTN - Continue NTG drip - Check TSH, lipids - Resume home meds -amlodipine   #.  Chest pain and mildly elevated troponin likely 2/2 above - Trend trops -Check TSH and lipids -O2 -Cardiology consult  #. Hypoxia, new, unclear etiology possibly related to history of asthma/COPD, demand from hypertension, possible pulm arterial HTN as seen on CT - O2 and nebs PRN -Check respiratory path panel  #. History of afib - Continue Eliquis  #. History of CHF - Continue monitor intake and output  #. History of HTN - Continue amlodipine   #. History of CKD, creatinine near baseline - Monitor BMP  #. History of BPH - Continue tamsulosin , Toviaz   Admission status: IP  stepdown IV Fluids: LR Diet/Nutrition: NPO Consults called: GI  DVT Px: Eliquis, SCDs and early ambulation. Code Status: DNR/DNI Disposition Plan: TBD  All the records are reviewed and case discussed with ED provider. Management plans discussed with the patient and/or family who express understanding and agree with plan of care.  Montrelle Eddings D.O. on 07/30/2024 at 4:01 PM CC: Primary care physician; Lenon Layman ORN, MD   07/30/2024, 4:01 PM

## 2024-07-30 NOTE — ED Notes (Signed)
 Pt SPO2 88% on RA. Pt placed on 3L [place].

## 2024-07-30 NOTE — ED Notes (Signed)
 Pt to CT at this time. RT going with pt to CT.

## 2024-07-30 NOTE — ED Triage Notes (Signed)
 Pt to ED via [facility] from [facility]. Pt complains of 10 out of 10 substernal chest pain that started upon waking from a nap. Per EMS pt was diaphoretic on arrival. EMS gave 3 sublingual nitroglycerin and 324 ASA. EMS reports pt was hypertensive at 206/123. Pt reports nausea on ED arrival.

## 2024-07-31 ENCOUNTER — Encounter: Payer: Self-pay | Admitting: Family Medicine

## 2024-07-31 DIAGNOSIS — K859 Acute pancreatitis without necrosis or infection, unspecified: Secondary | ICD-10-CM

## 2024-07-31 DIAGNOSIS — I161 Hypertensive emergency: Secondary | ICD-10-CM | POA: Diagnosis not present

## 2024-07-31 LAB — CBC
HCT: 34.8 % — ABNORMAL LOW (ref 39.0–52.0)
Hemoglobin: 11.4 g/dL — ABNORMAL LOW (ref 13.0–17.0)
MCH: 31.9 pg (ref 26.0–34.0)
MCHC: 32.8 g/dL (ref 30.0–36.0)
MCV: 97.5 fL (ref 80.0–100.0)
Platelets: 139 K/uL — ABNORMAL LOW (ref 150–400)
RBC: 3.57 MIL/uL — ABNORMAL LOW (ref 4.22–5.81)
RDW: 14 % (ref 11.5–15.5)
WBC: 12.3 K/uL — ABNORMAL HIGH (ref 4.0–10.5)
nRBC: 0 % (ref 0.0–0.2)

## 2024-07-31 LAB — COMPREHENSIVE METABOLIC PANEL WITH GFR
ALT: 179 U/L — ABNORMAL HIGH (ref 0–44)
AST: 236 U/L — ABNORMAL HIGH (ref 15–41)
Albumin: 2.5 g/dL — ABNORMAL LOW (ref 3.5–5.0)
Alkaline Phosphatase: 180 U/L — ABNORMAL HIGH (ref 38–126)
Anion gap: 12 (ref 5–15)
BUN: 29 mg/dL — ABNORMAL HIGH (ref 8–23)
CO2: 26 mmol/L (ref 22–32)
Calcium: 8.6 mg/dL — ABNORMAL LOW (ref 8.9–10.3)
Chloride: 100 mmol/L (ref 98–111)
Creatinine, Ser: 1.14 mg/dL (ref 0.61–1.24)
GFR, Estimated: >60 mL/min (ref >60)
Glucose, Bld: 125 mg/dL — ABNORMAL HIGH (ref 70–99)
Potassium: 4.2 mmol/L (ref 3.5–5.1)
Sodium: 138 mmol/L (ref 135–145)
Total Bilirubin: 5.9 mg/dL — ABNORMAL HIGH (ref 0.0–1.2)
Total Protein: 6.9 g/dL (ref 6.5–8.1)

## 2024-07-31 LAB — URINALYSIS, ROUTINE W REFLEX MICROSCOPIC
Bilirubin Urine: NEGATIVE
Glucose, UA: NEGATIVE mg/dL
Hgb urine dipstick: NEGATIVE
Ketones, ur: NEGATIVE mg/dL
Leukocytes,Ua: NEGATIVE
Nitrite: NEGATIVE
Protein, ur: 100 mg/dL — AB
Specific Gravity, Urine: 1.028 (ref 1.005–1.030)
Squamous Epithelial / HPF: 0 /HPF (ref 0–5)
pH: 5 (ref 5.0–8.0)

## 2024-07-31 LAB — RESPIRATORY PANEL BY PCR

## 2024-07-31 LAB — TROPONIN I (HIGH SENSITIVITY): Troponin I (High Sensitivity): 78 ng/L — ABNORMAL HIGH (ref <18)

## 2024-07-31 LAB — APTT: aPTT: 109 s — ABNORMAL HIGH (ref 24–36)

## 2024-07-31 LAB — MAGNESIUM: Magnesium: 1.7 mg/dL (ref 1.7–2.4)

## 2024-07-31 MED ORDER — METOPROLOL TARTRATE 25 MG PO TABS
12.5000 mg | ORAL_TABLET | Freq: Two times a day (BID) | ORAL | Status: DC
  Administered 2024-07-31: 12.5 mg via ORAL
  Filled 2024-07-31 (×2): qty 1

## 2024-07-31 MED ORDER — PIPERACILLIN-TAZOBACTAM 3.375 G IVPB
3.3750 g | Freq: Three times a day (TID) | INTRAVENOUS | Status: DC
  Administered 2024-07-31 – 2024-08-03 (×10): 3.375 g via INTRAVENOUS
  Filled 2024-07-31 (×11): qty 50

## 2024-07-31 MED ORDER — MAGNESIUM SULFATE 2 GM/50ML IV SOLN
2.0000 g | Freq: Once | INTRAVENOUS | Status: AC
  Administered 2024-07-31: 2 g via INTRAVENOUS
  Filled 2024-07-31: qty 50

## 2024-07-31 MED ORDER — OXYCODONE HCL 5 MG PO TABS: 5.0000 mg | ORAL_TABLET | ORAL | Status: DC | PRN

## 2024-07-31 MED ORDER — HEPARIN (PORCINE) 25000 UT/250ML-% IV SOLN
1400.0000 [IU]/h | INTRAVENOUS | Status: DC
  Administered 2024-07-31: 1250 [IU]/h via INTRAVENOUS
  Administered 2024-08-01: 1150 [IU]/h via INTRAVENOUS
  Filled 2024-07-31 (×2): qty 250

## 2024-07-31 MED ORDER — IBUPROFEN 400 MG PO TABS: 400.0000 mg | ORAL_TABLET | Freq: Four times a day (QID) | ORAL | Status: DC | PRN

## 2024-07-31 MED ORDER — CHLORHEXIDINE GLUCONATE CLOTH 2 % EX PADS
6.0000 | MEDICATED_PAD | Freq: Every day | CUTANEOUS | Status: DC
  Administered 2024-07-31 – 2024-08-02 (×3): 6 via TOPICAL

## 2024-07-31 MED ORDER — AMLODIPINE BESYLATE 5 MG PO TABS
5.0000 mg | ORAL_TABLET | Freq: Every day | ORAL | Status: DC
  Administered 2024-07-31: 5 mg via ORAL
  Filled 2024-07-31: qty 1

## 2024-07-31 MED ORDER — AMLODIPINE BESYLATE 5 MG PO TABS
5.0000 mg | ORAL_TABLET | Freq: Two times a day (BID) | ORAL | Status: DC
  Administered 2024-07-31: 5 mg via ORAL
  Filled 2024-07-31: qty 1

## 2024-07-31 MED ORDER — MORPHINE SULFATE (PF) 2 MG/ML IV SOLN
2.0000 mg | INTRAVENOUS | Status: DC | PRN
  Administered 2024-07-31 (×2): 2 mg via INTRAVENOUS
  Filled 2024-07-31 (×3): qty 1

## 2024-07-31 MED ORDER — IBUPROFEN 400 MG PO TABS
400.0000 mg | ORAL_TABLET | Freq: Four times a day (QID) | ORAL | Status: DC | PRN
  Administered 2024-07-31 (×2): 400 mg via ORAL
  Filled 2024-07-31 (×2): qty 1

## 2024-07-31 NOTE — Progress Notes (Signed)
 Patient becoming more confused, believing he is back at the nursing home where he lives, stating that he is ready to set everything up like it has been the past two months and that he is ready to get things started for the day. RN reminds him that he is in the ICU. Patient is pleasant, redirectable, and is able to be reoriented, but is forgetful and needs to be reminded every so often.  Arlean FORBES Bowers, RN

## 2024-07-31 NOTE — Consult Note (Signed)
 David Copping, MD [facility]  [address] [place], [place] [address] Phone: [phone] Fax : [phone]  Consultation  Referring Provider:     Dr. Clarine Primary Care Physician:  Lenon Layman ORN, MD Primary Gastroenterologist:      [facility]     Reason for Consultation:     Abnormal liver enzymes  Date of Admission:  07/30/2024 Date of Consultation:  07/31/2024         HPI:   David Frey is a 89 y.o. male who came to the emergency department yesterday from his assisted living community with 10 out of 10 substernal chest pain that started when he woke up.  The patient has a history of asthma, hypertension, CHF, COPD, hyperlipidemia, chronic kidney disease, chronic A-fib with sick sinus syndrome with a pacemaker. The patient was treated with sublingual nitro glycerin and an aspirin.  The patient was found to have acute pancreatitis with elevated lipase as well as liver enzymes and a MRCP was ordered.  The patient's liver enzymes showed:  Component     Latest Ref Rng 07/27/2024 07/30/2024 07/31/2024  AST     15 - 41 U/L 20  266 (H)  236 (H)   ALT     0 - 44 U/L 10  116 (H)  179 (H)   Alkaline Phosphatase     38 - 126 U/L 72  173 (H)  180 (H)   Total Bilirubin     0.0 - 1.2 mg/dL 1.3 (H)  4.0 (H)  5.9 (H)    The patient's lipase was reported to be over 2800.  The patient came in with a normal white cell count of 7.8 that increased to 12.3 this morning. The patient did undergo the MRCP that was resulted this morning that showed:  IMPRESSION: 1. Acute pancreatitis with diffuse pancreatic edema. No pancreatic necrosis or pseudocyst. 2. Large gallbladder neck stone measuring 2.7 cm with layering sludge. Equivocal gallbladder wall thickening. 3. Mild central biliary dilatation with common bile duct measuring up to 9 mm, likely filled with sludge. No choledocholithiasis identified; evaluation limited by motion artifact. 4. Small volume perihepatic and pericholecystic  ascites.  The patient was admitted to the ICU due to the need for nitroglycerin.  The patient is also reported to be on Eliquis at home and had his last dose given in the hospital last night.  The patient also has been evaluated by cardiology and the recommendations are pending.  Past Medical History:  Diagnosis Date   Actinic keratosis    Arthritis, degenerative 10/21/2015   Asthma without status asthmaticus 12/13/2014   Atrial fibrillation (HCC)    Benign fibroma of prostate 12/13/2014   Benign prostatic hyperplasia with urinary obstruction 10/30/2012   Blood glucose elevated 11/28/2014   BP (high blood pressure) 12/13/2014   Chronic obstructive pulmonary disease (HCC) 12/13/2014   Chronic systolic heart failure (HCC) 07/09/2014   Overview:  Global ef 45%    Combined fat and carbohydrate induced hyperlipemia 04/01/2014   Edema leg 04/01/2014   Excessive urination at night 10/30/2012   Gout 12/13/2014   Hypertension    Impaired renal function 12/13/2014   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, implant, and graft 05/13/2007   Overview:  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    Methicillin susceptible Staphylococcus aureus infection 02/25/2010   Overview:  Methicillin Susceptible Staphylococcus Aureus Infection    MI (mitral incompetence) 07/09/2014   Obstruction  to urinary outflow 12/13/2014   Paroxysmal atrial fibrillation (HCC) 07/09/2014    Past Surgical History:  Procedure Laterality Date   APPENDECTOMY  09/27/1941   COLONOSCOPY WITH PROPOFOL  N/A 03/15/2020   Procedure: COLONOSCOPY WITH PROPOFOL ;  Surgeon: Toledo, Ladell POUR, MD;  Location: [facility] ENDOSCOPY;  Service: Gastroenterology;  Laterality: N/A;   ESOPHAGOGASTRODUODENOSCOPY (EGD) WITH PROPOFOL  N/A 03/15/2020   Procedure: ESOPHAGOGASTRODUODENOSCOPY (EGD) WITH PROPOFOL ;  Surgeon: Toledo, Ladell POUR, MD;  Location: [facility] ENDOSCOPY;  Service: Gastroenterology;  Laterality: N/A;   HERNIA REPAIR   09/28/1991   PACEMAKER LEADLESS INSERTION N/A 12/18/2018   Procedure: PACEMAKER LEADLESS INSERTION;  Surgeon: Ammon Blunt, MD;  Location: [facility] INVASIVE CV LAB;  Service: Cardiovascular;  Laterality: N/A;   TONSILLECTOMY  09/27/1964   TOTAL KNEE ARTHROPLASTY  09/27/2005   TRANSURETHRAL RESECTION OF PROSTATE  09/28/1983    Prior to Admission medications   Medication Sig Start Date End Date Taking? Authorizing Provider  amLODipine  (NORVASC ) 2.5 MG tablet Take 2.5 mg by mouth daily.   Yes [provider]  amoxicillin-clavulanate (AUGMENTIN) 875-125 MG tablet Take 1 tablet by mouth 2 (two) times daily. 07/27/24  Yes Paduchowski, Franky, MD  Apoaequorin (PREVAGEN PO) Take 1 capsule by mouth daily.   Yes [provider]  calcium  carbonate (OSCAL) 1500 (600 Ca) MG TABS tablet Take 1 tablet by mouth. Takes 3 times a week (Monday, Wednesday, Saturday)   Yes [provider]  COMBIGAN  0.2-0.5 % ophthalmic solution Place 1 drop into the left eye 2 (two) times daily. 11/14/18  Yes [provider]  docusate sodium  (COLACE) 100 MG capsule Take 100 mg by mouth 3 (three) times a week. Tuesday, Thursday, Saturday   Yes [provider]  ELIQUIS 5 MG TABS tablet Take 5 mg by mouth 2 (two) times daily.   Yes [provider]  fesoterodine  (TOVIAZ ) 4 MG TB24 tablet TAKE ONE TABLET BY MOUTH ONE TIME DAILY 01/23/24  Yes Stoioff, Glendia BROCKS, MD  latanoprost  (XALATAN ) 0.005 % ophthalmic solution Place 1 drop into both eyes at bedtime.    Yes [provider]  tamsulosin  (FLOMAX ) 0.4 MG CAPS capsule TAKE 1 CAPSULE BY MOUTH EVERY DAY 12/12/23  Yes Stoioff, Glendia BROCKS, MD    Family History  Problem Relation Age of Onset   Bladder Cancer Neg Hx    Prostate cancer Neg Hx    Kidney cancer Neg Hx      Social History   Tobacco Use   Smoking status: Never   Smokeless tobacco: Never  Vaping Use   Vaping status: Never Used  Substance Use Topics   Alcohol  use: No    Alcohol/week: 0.0 standard drinks of alcohol   Drug use: No    Allergies as of 07/30/2024 - Review Complete 07/30/2024  Allergen Reaction Noted   Celecoxib Rash and Other (See Comments) 11/26/2013    Review of Systems:    All systems reviewed and negative except where noted in HPI.   Physical Exam:  Vital signs in last 24 hours: Temp:  [97.9 F (36.6 C)-98.6 F (37 C)] 98.6 F (37 C) (11/04 0400) Pulse Rate:  [70-95] 85 (11/04 0600) Resp:  [12-35] 26 (11/04 0600) BP: (113-194)/(63-117) 139/94 (11/04 0600) SpO2:  [90 %-97 %] 90 % (11/04 0600) FiO2 (%):  [30 %] 30 % (11/03 1316) Weight:  [80 kg-80.3 kg] 80.3 kg (11/03 1241) Last BM Date :  (PTA) General:   Pleasant, cooperative in NAD Head:  Normocephalic and atraumatic. Eyes:  No icterus.   Conjunctiva pink. PERRLA. Ears:  Normal auditory acuity. Neck:  Supple; no masses or thyroidomegaly Lungs: Respirations even and unlabored. Lungs clear to auscultation bilaterally.   No wheezes, crackles, or rhonchi.  Heart:  Regular rate and rhythm;  Without murmur, clicks, rubs or gallops Abdomen:  Soft, nondistended, nontender. Normal bowel sounds. No appreciable masses or hepatomegaly.  No rebound or guarding.  Rectal:  Not performed. Msk:  Symmetrical without gross deformities.    Extremities:  Without edema, cyanosis or clubbing. Neurologic:  Alert and oriented x3;  grossly normal neurologically. Skin:  Intact without significant lesions or rashes. Cervical Nodes:  No significant cervical adenopathy. Psych:  Alert and cooperative. Normal affect.  LAB RESULTS: Recent Labs    07/30/24 1230 07/31/24 0130  WBC 7.8 12.3*  HGB 13.9 11.4*  HCT 43.1 34.8*  PLT 161 139*   BMET Recent Labs    07/30/24 1230 07/31/24 0130  NA 138 138  K 3.9 4.2  CL 99 100  CO2 27 26  GLUCOSE 113* 125*  BUN 24* 29*  CREATININE 1.15 1.14  CALCIUM  9.0 8.6*   LFT Recent Labs    07/31/24 0130  PROT 6.9  ALBUMIN 2.5*  AST 236*   ALT 179*  ALKPHOS 180*  BILITOT 5.9*   PT/INR Recent Labs    07/30/24 1230  LABPROT 18.6*  INR 1.5*    STUDIES: MR ABDOMEN MRCP W WO CONTAST Result Date: 07/31/2024 EXAM: MRCP WITH AND WITHOUT IV CONTRAST 07/30/2024 10:57:39 PM TECHNIQUE: Multisequence, multiplanar magnetic resonance images of the abdomen with and without intravenous contrast. MRCP sequences were performed. 8 mL (gadobutrol (GADAVIST) 1 MMOL/ML injection 8 mL GADOBUTROL 1 MMOL/ML IV SOLN). COMPARISON: None available. CLINICAL HISTORY: Pancreatitis, hepatobiliary obstruction, c/f choledocholithiasis. FINDINGS: LIVER: There is relative hypertrophy of the caudate lobe and lateral segment of left lobe of liver. No focal liver lesion identified. GALLBLADDER AND BILIARY SYSTEM: Large stone identified within the gallbladder neck measures 2.7 cm, image 15/4. Layering gallbladder sludge is also identified. Equivocal gallbladder wall thickening measuring up to 3 mm, image 29/26. The MRCP images are significantly motion degraded. Intermediate T2 signal is noted in the common bile duct. The common bile duct measures up to 9 mm and there is intermediate T1 and T2 signal throughout the course of the common bile duct which is likely filled with gallbladder sludge. Mild central biliary dilatation identified. No signs of choledocholithiasis. SPLEEN: Normal appearance of the spleen. PANCREAS/PANCREATIC DUCT: The main pancreatic duct is upper limits of normal measuring 3 mm. Diffuse edema throughout the course of the pancreas is identified concerning for acute pancreatitis. No signs of pancreatic necrosis or pseudocyst. ADRENAL GLANDS: Normal size and morphology bilaterally. No nodule, thickening, or hemorrhage. No periadrenal stranding. KIDNEYS: Exophytic cyst of the upper pole of right kidney measures 1.9 cm; this is uniformly T2 hyperintense without internal enhancement. No follow-up imaging recommended. Left renal cortical scarring. LYMPH NODES: No  adenopathy identified. VASCULATURE: The upper abdomen vascularity appears patent. Aortic atherosclerotic calcifications. PERITONEUM: Small volume of right upper quadrant, perihepatic, and pericholecystic ascites. ABDOMINAL WALL: Fat containing, midline ventral abdominal wall hernia measures 3.2 cm. BOWEL: Grossly unremarkable. No bowel obstruction. BONES: Thoracolumbar scoliosis deformity with convexity towards the left. Lumbar degenerative disc disease. No acute abnormality or worrisome osseous lesion. SOFT TISSUES: Unremarkable. MISCELLANEOUS: Cardiac enlargement. IMPRESSION: 1. Acute pancreatitis with diffuse pancreatic edema. No pancreatic necrosis or pseudocyst. 2. Large gallbladder neck stone measuring 2.7 cm with layering sludge. Equivocal gallbladder wall  thickening. 3. Mild central biliary dilatation with common bile duct measuring up to 9 mm, likely filled with sludge. No choledocholithiasis identified; evaluation limited by motion artifact. 4. Small volume perihepatic and pericholecystic ascites. Electronically signed by: Waddell Calk MD 07/31/2024 05:23 AM EST RP Workstation: [id number]   MR 3D Recon At Scanner Result Date: 07/31/2024 EXAM: MRCP WITH AND WITHOUT IV CONTRAST 07/30/2024 10:57:39 PM TECHNIQUE: Multisequence, multiplanar magnetic resonance images of the abdomen with and without intravenous contrast. MRCP sequences were performed. 8 mL (gadobutrol (GADAVIST) 1 MMOL/ML injection 8 mL GADOBUTROL 1 MMOL/ML IV SOLN). COMPARISON: None available. CLINICAL HISTORY: Pancreatitis, hepatobiliary obstruction, c/f choledocholithiasis. FINDINGS: LIVER: There is relative hypertrophy of the caudate lobe and lateral segment of left lobe of liver. No focal liver lesion identified. GALLBLADDER AND BILIARY SYSTEM: Large stone identified within the gallbladder neck measures 2.7 cm, image 15/4. Layering gallbladder sludge is also identified. Equivocal gallbladder wall thickening measuring up to 3 mm, image  29/26. The MRCP images are significantly motion degraded. Intermediate T2 signal is noted in the common bile duct. The common bile duct measures up to 9 mm and there is intermediate T1 and T2 signal throughout the course of the common bile duct which is likely filled with gallbladder sludge. Mild central biliary dilatation identified. No signs of choledocholithiasis. SPLEEN: Normal appearance of the spleen. PANCREAS/PANCREATIC DUCT: The main pancreatic duct is upper limits of normal measuring 3 mm. Diffuse edema throughout the course of the pancreas is identified concerning for acute pancreatitis. No signs of pancreatic necrosis or pseudocyst. ADRENAL GLANDS: Normal size and morphology bilaterally. No nodule, thickening, or hemorrhage. No periadrenal stranding. KIDNEYS: Exophytic cyst of the upper pole of right kidney measures 1.9 cm; this is uniformly T2 hyperintense without internal enhancement. No follow-up imaging recommended. Left renal cortical scarring. LYMPH NODES: No adenopathy identified. VASCULATURE: The upper abdomen vascularity appears patent. Aortic atherosclerotic calcifications. PERITONEUM: Small volume of right upper quadrant, perihepatic, and pericholecystic ascites. ABDOMINAL WALL: Fat containing, midline ventral abdominal wall hernia measures 3.2 cm. BOWEL: Grossly unremarkable. No bowel obstruction. BONES: Thoracolumbar scoliosis deformity with convexity towards the left. Lumbar degenerative disc disease. No acute abnormality or worrisome osseous lesion. SOFT TISSUES: Unremarkable. MISCELLANEOUS: Cardiac enlargement. IMPRESSION: 1. Acute pancreatitis with diffuse pancreatic edema. No pancreatic necrosis or pseudocyst. 2. Large gallbladder neck stone measuring 2.7 cm with layering sludge. Equivocal gallbladder wall thickening. 3. Mild central biliary dilatation with common bile duct measuring up to 9 mm, likely filled with sludge. No choledocholithiasis identified; evaluation limited by motion  artifact. 4. Small volume perihepatic and pericholecystic ascites. Electronically signed by: Waddell Calk MD 07/31/2024 05:23 AM EST RP Workstation: [id number]   CT Angio Chest/Abd/Pel for Dissection W and/or Wo Contrast Result Date: 07/30/2024 CLINICAL DATA:  Chest pain, shortness of breath. EXAM: CT ANGIOGRAPHY CHEST, ABDOMEN AND PELVIS TECHNIQUE: Non-contrast CT of the chest was initially obtained. Multidetector CT imaging through the chest, abdomen and pelvis was performed using the standard protocol during bolus administration of intravenous contrast. Multiplanar reconstructed images and MIPs were obtained and reviewed to evaluate the vascular anatomy. RADIATION DOSE REDUCTION: This exam was performed according to the departmental dose-optimization program which includes automated exposure control, adjustment of the mA and/or kV according to patient size and/or use of iterative reconstruction technique. CONTRAST:  100mL OMNIPAQUE IOHEXOL 350 MG/ML SOLN COMPARISON:  None Available. FINDINGS: CTA CHEST FINDINGS Cardiovascular: No evidence of an aortic intramural hematoma on precontrast imaging or aortic dissection or aneurysm on postcontrast imaging. Atherosclerotic calcification of the  aorta, aortic valve and coronary arteries. Heart is enlarged. No pericardial effusion. Enlarged pulmonic trunk. Mediastinum/Nodes: No pathologically enlarged mediastinal, hilar or axillary lymph nodes. Air in the esophagus can be seen with dysmotility. Lungs/Pleura: Mild dependent atelectasis bilaterally. Pulmonary nodules measure 6 mm or less in size. No specific follow-up recommended in a patient of this age. No pleural fluid. Airway is unremarkable. Musculoskeletal: Degenerative changes in the spine. Review of the MIP images confirms the above findings. CTA ABDOMEN AND PELVIS FINDINGS VASCULAR Aorta: No aneurysm or dissection.  Atherosclerotic calcification. Celiac: Ostial calcification without significant luminal  narrowing. Widely patent. SMA: Ostial calcification.  Widely patent. Renals: Ostial calcification bilaterally.  Widely patent. IMA: Widely patent. Inflow: Minimally ectatic but not aneurysmal. Atherosclerotic calcification. No dissection. Veins: Poorly evaluated due to lack of opacification. Review of the MIP images confirms the above findings. NON-VASCULAR Hepatobiliary: Liver is unremarkable. Gallstones. No biliary ductal dilatation. Pancreas: Negative. Spleen: Negative. Adrenals/Urinary Tract: Adrenal glands are unremarkable. Low-attenuation lesions in the kidneys. No specific follow-up necessary. Renal cortical scarring and thinning bilaterally. Ureters are decompressed. Small right lateral bladder diverticulum. Stomach/Bowel: Small hiatal hernia. Stomach is otherwise unremarkable. Duodenal diverticula are incidentally noted. Small bowel and colon are otherwise unremarkable. Appendix is reportedly absent. Lymphatic: No pathologically enlarged lymph nodes. Reproductive: Prostate is visualized. Other: Small bilateral inguinal hernias contain fat. Small umbilical hernia contains fat. No free fluid. Musculoskeletal: Degenerative changes in the spine. Osteopenia. Mild levoconvex scoliosis. Review of the MIP images confirms the above findings. IMPRESSION: 1. No evidence of acute aortic syndrome or aneurysm. 2. Cholelithiasis. 3. Aortic atherosclerosis (ICD10-I70.0). Coronary artery calcification. 4. Enlarged pulmonic trunk, indicative of pulmonary arterial hypertension. Electronically Signed   By: Newell Eke M.D.   On: 07/30/2024 14:32   DG Chest Portable 1 View Result Date: 07/30/2024 CLINICAL DATA:  Substernal chest pain and shortness of breath. EXAM: PORTABLE CHEST 1 VIEW COMPARISON:  09/12/2009. FINDINGS: Trachea is midline. Heart is enlarged. Lead less pacemaker projects over the left heart. Lungs are low in volume but clear. No pleural fluid. IMPRESSION: No acute findings. Electronically Signed   By:  Newell Eke M.D.   On: 07/30/2024 14:22      Impression / Plan:   Assessment: Principal Problem:   Hypertensive emergency   David Frey is a 89 y.o. y/o male with elevated liver enzymes with sludge in the common bile duct on MRCP and acute pancreatitis.  The patient has been given Eliquis and has also had chest pain being treated with nitroglycerin.  The patient is being evaluated by cardiology at this time. Patient's liver enzymes have increased since yesterday.  Plan:  The patient has sludge in the common bile duct with elevated liver enzymes and pancreatitis.  Patient may have passed a stone although the sludge can also be causing his symptoms.  The patient has also had an increase in his white cell count.  The patient would like me to speak to his children prior to setting up any intervention.  The patient may need ERCP which is complicated by the patient getting his anticoagulation and his chest pain.  The patient will need to be cleared by cardiology prior to having any invasive procedures.  I will make contact with the patient's children and see what their wishes are.  For now patient should be treated with supportive care and start on antibiotics for his obstructive jaundice.   Thank you for involving me in the care of this patient.  LOS: 1 day   David Copping, MD, MD. NOLIA 07/31/2024, 8:36 AM,  Pager [phone] 7am-5pm  Check AMION for 5pm -7am coverage and on weekends   Note: This dictation was prepared with Dragon dictation along with smaller phrase technology. Any transcriptional errors that result from this process are unintentional.

## 2024-07-31 NOTE — Progress Notes (Addendum)
 RN heard patient call for help inside room. Upon entering room, patient complaining of sharp chest pain that awakened him from sleep. Nitroglycerin drip increased per order from 10 to 50 mcg/min until chest pain completley resolved. Patient once again resting comfortably.  Arlean FORBES Bowers, RN

## 2024-07-31 NOTE — Consult Note (Signed)
 [facility] CLINIC CARDIOLOGY CONSULT NOTE       Patient ID: David Frey MRN: [id number] DOB/AGE: March 12, 1930 89 y.o.  Admit date: 07/30/2024 Referring Physician Dr. Thersia Roger  Primary Physician Lenon Layman ORN, MD  Primary Cardiologist Dr. Wilburn Reason for Consultation Elevated troponin, chest pain  HPI: David Frey is a 89 y.o. male  with a past medical history of sick sinus syndrome s/p Micra pacer implant 2020, chronic atrial fibrillation, hypertension, moderate mitral insufficiency, hyperlipidemia who presented to the ED on 07/30/2024 for chest pain. Trops mildly elevated. Lipase found to be significantly elevated and MRCP demonstrated acute pancreatitis. Cardiology was consulted for further evaluation.   Portions of history obtained via chart review due to patients mental status. States that he began experiencing CP yesterday. Given symptoms he was brought to ED for evaluation. Workup in the ED notable for creatinine 1.15, potassium 3.9, hemoglobin 13.9, WBC 7.8. Lipase >2800. AST 266, ALT 116, T bili 4.0.Troponins 35 > 36 > 64 > 69 > 78, BNP 404. EKG in the ED ventricular pacing rate 94 bpm. Significantly hypertensive in the ED. CTA chest negative for acute abnormality, some evidence of pulmonary arterial hypertension. MRCP done after Lipase found to be elevated and noted acute pancreatitis with edema, no necrosis or pseudocyst, as well as cholelithiasis with associated sludge. Started on NTG gtt for BP control in the ED as well as IVF.  At the time of my evaluation this morning, patient is resting comfortably in hospital bed.  He states that yesterday he began experiencing sudden onset chest pain that began when he woke up from a nap.  He describes the pain as sharp and constant.  Denies any exertional symptoms or recent episodes of angina.  States that overall this morning his pain is improved, states this is mild.  Denies any shortness of breath, palpitations.  Review  of systems complete and found to be negative unless listed above    Past Medical History:  Diagnosis Date   Actinic keratosis    Arthritis, degenerative 10/21/2015   Asthma without status asthmaticus 12/13/2014   Atrial fibrillation (HCC)    Benign fibroma of prostate 12/13/2014   Benign prostatic hyperplasia with urinary obstruction 10/30/2012   Blood glucose elevated 11/28/2014   BP (high blood pressure) 12/13/2014   Chronic obstructive pulmonary disease (HCC) 12/13/2014   Chronic systolic heart failure (HCC) 07/09/2014   Overview:  Global ef 45%    Combined fat and carbohydrate induced hyperlipemia 04/01/2014   Edema leg 04/01/2014   Excessive urination at night 10/30/2012   Gout 12/13/2014   Hypertension    Impaired renal function 12/13/2014   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, implant, and graft 05/13/2007   Overview:  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    Methicillin susceptible Staphylococcus aureus infection 02/25/2010   Overview:  Methicillin Susceptible Staphylococcus Aureus Infection    MI (mitral incompetence) 07/09/2014   Obstruction to urinary outflow 12/13/2014   Paroxysmal atrial fibrillation (HCC) 07/09/2014    Past Surgical History:  Procedure Laterality Date   APPENDECTOMY  09/27/1941   COLONOSCOPY WITH PROPOFOL  N/A 03/15/2020   Procedure: COLONOSCOPY WITH PROPOFOL ;  Surgeon: Toledo, Ladell POUR, MD;  Location: [facility] ENDOSCOPY;  Service: Gastroenterology;  Laterality: N/A;   ESOPHAGOGASTRODUODENOSCOPY (EGD) WITH PROPOFOL  N/A 03/15/2020   Procedure: ESOPHAGOGASTRODUODENOSCOPY (EGD) WITH PROPOFOL ;  Surgeon: Toledo, Ladell POUR, MD;  Location: [facility] ENDOSCOPY;  Service: Gastroenterology;  Laterality: N/A;   HERNIA REPAIR  09/28/1991  PACEMAKER LEADLESS INSERTION N/A 12/18/2018   Procedure: PACEMAKER LEADLESS INSERTION;  Surgeon: Ammon Blunt, MD;  Location: [facility] INVASIVE CV LAB;  Service: Cardiovascular;  Laterality: N/A;    TONSILLECTOMY  09/27/1964   TOTAL KNEE ARTHROPLASTY  09/27/2005   TRANSURETHRAL RESECTION OF PROSTATE  09/28/1983    Medications Prior to Admission  Medication Sig Dispense Refill Last Dose/Taking   amLODipine  (NORVASC ) 2.5 MG tablet Take 2.5 mg by mouth daily.   07/30/2024 at  8:03 AM   amoxicillin-clavulanate (AUGMENTIN) 875-125 MG tablet Take 1 tablet by mouth 2 (two) times daily. 14 tablet 0 07/30/2024 at  8:03 AM   Apoaequorin (PREVAGEN PO) Take 1 capsule by mouth daily.   07/30/2024 at  8:03 AM   calcium  carbonate (OSCAL) 1500 (600 Ca) MG TABS tablet Take 1 tablet by mouth. Takes 3 times a week (Monday, Wednesday, Saturday)   07/30/2024 at  8:03 AM   COMBIGAN  0.2-0.5 % ophthalmic solution Place 1 drop into the left eye 2 (two) times daily.   07/30/2024 at  8:50 AM   docusate sodium  (COLACE) 100 MG capsule Take 100 mg by mouth 3 (three) times a week. Tuesday, Thursday, Saturday   Unknown   ELIQUIS 5 MG TABS tablet Take 5 mg by mouth 2 (two) times daily.   07/30/2024 at  8:03 AM   fesoterodine  (TOVIAZ ) 4 MG TB24 tablet TAKE ONE TABLET BY MOUTH ONE TIME DAILY 30 tablet 11 07/30/2024 at  8:03 AM   latanoprost  (XALATAN ) 0.005 % ophthalmic solution Place 1 drop into both eyes at bedtime.    07/29/2024 at  8:12 PM   tamsulosin  (FLOMAX ) 0.4 MG CAPS capsule TAKE 1 CAPSULE BY MOUTH EVERY DAY 90 capsule 3 07/30/2024 at  8:04 AM   Social History   Socioeconomic History   Marital status: Married    Spouse name: Not on file   Number of children: Not on file   Years of education: Not on file   Highest education level: Not on file  Occupational History   Not on file  Tobacco Use   Smoking status: Never   Smokeless tobacco: Never  Vaping Use   Vaping status: Never Used  Substance and Sexual Activity   Alcohol use: No    Alcohol/week: 0.0 standard drinks of alcohol   Drug use: No   Sexual activity: Yes  Other Topics Concern   Not on file  Social History Narrative   Not on file   Social Drivers  of Health   Financial Resource Strain: Low Risk  (02/07/2024)   Received from [facility]   Overall Financial Resource Strain (CARDIA)    Difficulty of Paying Living Expenses: Not hard at all  Food Insecurity: No Food Insecurity (07/30/2024)   Hunger Vital Sign    Worried About Running Out of Food in the Last Year: Never true    Ran Out of Food in the Last Year: Never true  Transportation Needs: No Transportation Needs (07/30/2024)   PRAPARE - Administrator, Civil Service (Medical): No    Lack of Transportation (Non-Medical): No  Physical Activity: Not on file  Stress: Not on file  Social Connections: Socially Isolated (07/30/2024)   Social Connection and Isolation Panel    Frequency of Communication with Friends and Family: More than three times a week    Frequency of Social Gatherings with Friends and Family: More than three times a week    Attends Religious Services: Never    Active  Member of Clubs or Organizations: No    Attends Banker Meetings: Never    Marital Status: Widowed  Intimate Partner Violence: Not At Risk (07/30/2024)   Humiliation, Afraid, Rape, and Kick questionnaire    Fear of Current or Ex-Partner: No    Emotionally Abused: No    Physically Abused: No    Sexually Abused: No    Family History  Problem Relation Age of Onset   Bladder Cancer Neg Hx    Prostate cancer Neg Hx    Kidney cancer Neg Hx      Vitals:   07/31/24 0430 07/31/24 0500 07/31/24 0600 07/31/24 0800  BP: 122/77 (!) 163/94 (!) 139/94   Pulse: 88 75 85   Resp: (!) 31 (!) 24 (!) 26   Temp:    (!) 101.9 F (38.8 C)  TempSrc:    Oral  SpO2: 96% 93% 90%   Weight:      Height:        PHYSICAL EXAM General: Chronically ill-appearing elderly male, well nourished, in no acute distress. HEENT: Normocephalic and atraumatic. Neck: No JVD.  Lungs: Normal respiratory effort on 4L [place]. Clear bilaterally to auscultation. No wheezes, crackles, rhonchi.   Heart: Irregularly irregular, controlled rate. Normal S1 and S2 without gallops or murmurs.  Abdomen: Non-distended appearing.  Msk: Normal strength and tone for age. Extremities: Warm and well perfused. No clubbing, cyanosis.  No edema.  Neuro: Alert and oriented X 3. Psych: Answers questions appropriately.   Labs: Basic Metabolic Panel: Recent Labs    07/30/24 1230 07/31/24 0130  NA 138 138  K 3.9 4.2  CL 99 100  CO2 27 26  GLUCOSE 113* 125*  BUN 24* 29*  CREATININE 1.15 1.14  CALCIUM  9.0 8.6*  MG  --  1.7   Liver Function Tests: Recent Labs    07/30/24 1230 07/31/24 0130  AST 266* 236*  ALT 116* 179*  ALKPHOS 173* 180*  BILITOT 4.0* 5.9*  PROT 8.5* 6.9  ALBUMIN 3.1* 2.5*   Recent Labs    07/30/24 1230  LIPASE >2,800*   CBC: Recent Labs    07/30/24 1230 07/31/24 0130  WBC 7.8 12.3*  HGB 13.9 11.4*  HCT 43.1 34.8*  MCV 99.1 97.5  PLT 161 139*   Cardiac Enzymes: Recent Labs    07/30/24 1942 07/30/24 2245 07/31/24 0645  TROPONINIHS 64* 69* 78*   BNP: Recent Labs    07/30/24 1230  BNP 404.2*   D-Dimer: No results for input(s): DDIMER in the last 72 hours. Hemoglobin A1C: No results for input(s): HGBA1C in the last 72 hours. Fasting Lipid Panel: Recent Labs    07/30/24 1942  CHOL 91  HDL 36*  LDLCALC 47  TRIG 42  CHOLHDL 2.5   Thyroid Function Tests: Recent Labs    07/30/24 1942  TSH 1.103   Anemia Panel: No results for input(s): VITAMINB12, FOLATE, FERRITIN, TIBC, IRON, RETICCTPCT in the last 72 hours.   Radiology: MR ABDOMEN MRCP W WO CONTAST Result Date: 07/31/2024 EXAM: MRCP WITH AND WITHOUT IV CONTRAST 07/30/2024 10:57:39 PM TECHNIQUE: Multisequence, multiplanar magnetic resonance images of the abdomen with and without intravenous contrast. MRCP sequences were performed. 8 mL (gadobutrol (GADAVIST) 1 MMOL/ML injection 8 mL GADOBUTROL 1 MMOL/ML IV SOLN). COMPARISON: None available. CLINICAL HISTORY:  Pancreatitis, hepatobiliary obstruction, c/f choledocholithiasis. FINDINGS: LIVER: There is relative hypertrophy of the caudate lobe and lateral segment of left lobe of liver. No focal liver lesion identified. GALLBLADDER AND BILIARY SYSTEM:  Large stone identified within the gallbladder neck measures 2.7 cm, image 15/4. Layering gallbladder sludge is also identified. Equivocal gallbladder wall thickening measuring up to 3 mm, image 29/26. The MRCP images are significantly motion degraded. Intermediate T2 signal is noted in the common bile duct. The common bile duct measures up to 9 mm and there is intermediate T1 and T2 signal throughout the course of the common bile duct which is likely filled with gallbladder sludge. Mild central biliary dilatation identified. No signs of choledocholithiasis. SPLEEN: Normal appearance of the spleen. PANCREAS/PANCREATIC DUCT: The main pancreatic duct is upper limits of normal measuring 3 mm. Diffuse edema throughout the course of the pancreas is identified concerning for acute pancreatitis. No signs of pancreatic necrosis or pseudocyst. ADRENAL GLANDS: Normal size and morphology bilaterally. No nodule, thickening, or hemorrhage. No periadrenal stranding. KIDNEYS: Exophytic cyst of the upper pole of right kidney measures 1.9 cm; this is uniformly T2 hyperintense without internal enhancement. No follow-up imaging recommended. Left renal cortical scarring. LYMPH NODES: No adenopathy identified. VASCULATURE: The upper abdomen vascularity appears patent. Aortic atherosclerotic calcifications. PERITONEUM: Small volume of right upper quadrant, perihepatic, and pericholecystic ascites. ABDOMINAL WALL: Fat containing, midline ventral abdominal wall hernia measures 3.2 cm. BOWEL: Grossly unremarkable. No bowel obstruction. BONES: Thoracolumbar scoliosis deformity with convexity towards the left. Lumbar degenerative disc disease. No acute abnormality or worrisome osseous lesion. SOFT TISSUES:  Unremarkable. MISCELLANEOUS: Cardiac enlargement. IMPRESSION: 1. Acute pancreatitis with diffuse pancreatic edema. No pancreatic necrosis or pseudocyst. 2. Large gallbladder neck stone measuring 2.7 cm with layering sludge. Equivocal gallbladder wall thickening. 3. Mild central biliary dilatation with common bile duct measuring up to 9 mm, likely filled with sludge. No choledocholithiasis identified; evaluation limited by motion artifact. 4. Small volume perihepatic and pericholecystic ascites. Electronically signed by: Waddell Calk MD 07/31/2024 05:23 AM EST RP Workstation: [id number]   MR 3D Recon At Scanner Result Date: 07/31/2024 EXAM: MRCP WITH AND WITHOUT IV CONTRAST 07/30/2024 10:57:39 PM TECHNIQUE: Multisequence, multiplanar magnetic resonance images of the abdomen with and without intravenous contrast. MRCP sequences were performed. 8 mL (gadobutrol (GADAVIST) 1 MMOL/ML injection 8 mL GADOBUTROL 1 MMOL/ML IV SOLN). COMPARISON: None available. CLINICAL HISTORY: Pancreatitis, hepatobiliary obstruction, c/f choledocholithiasis. FINDINGS: LIVER: There is relative hypertrophy of the caudate lobe and lateral segment of left lobe of liver. No focal liver lesion identified. GALLBLADDER AND BILIARY SYSTEM: Large stone identified within the gallbladder neck measures 2.7 cm, image 15/4. Layering gallbladder sludge is also identified. Equivocal gallbladder wall thickening measuring up to 3 mm, image 29/26. The MRCP images are significantly motion degraded. Intermediate T2 signal is noted in the common bile duct. The common bile duct measures up to 9 mm and there is intermediate T1 and T2 signal throughout the course of the common bile duct which is likely filled with gallbladder sludge. Mild central biliary dilatation identified. No signs of choledocholithiasis. SPLEEN: Normal appearance of the spleen. PANCREAS/PANCREATIC DUCT: The main pancreatic duct is upper limits of normal measuring 3 mm. Diffuse edema  throughout the course of the pancreas is identified concerning for acute pancreatitis. No signs of pancreatic necrosis or pseudocyst. ADRENAL GLANDS: Normal size and morphology bilaterally. No nodule, thickening, or hemorrhage. No periadrenal stranding. KIDNEYS: Exophytic cyst of the upper pole of right kidney measures 1.9 cm; this is uniformly T2 hyperintense without internal enhancement. No follow-up imaging recommended. Left renal cortical scarring. LYMPH NODES: No adenopathy identified. VASCULATURE: The upper abdomen vascularity appears patent. Aortic atherosclerotic calcifications. PERITONEUM: Small volume of right upper  quadrant, perihepatic, and pericholecystic ascites. ABDOMINAL WALL: Fat containing, midline ventral abdominal wall hernia measures 3.2 cm. BOWEL: Grossly unremarkable. No bowel obstruction. BONES: Thoracolumbar scoliosis deformity with convexity towards the left. Lumbar degenerative disc disease. No acute abnormality or worrisome osseous lesion. SOFT TISSUES: Unremarkable. MISCELLANEOUS: Cardiac enlargement. IMPRESSION: 1. Acute pancreatitis with diffuse pancreatic edema. No pancreatic necrosis or pseudocyst. 2. Large gallbladder neck stone measuring 2.7 cm with layering sludge. Equivocal gallbladder wall thickening. 3. Mild central biliary dilatation with common bile duct measuring up to 9 mm, likely filled with sludge. No choledocholithiasis identified; evaluation limited by motion artifact. 4. Small volume perihepatic and pericholecystic ascites. Electronically signed by: Waddell Calk MD 07/31/2024 05:23 AM EST RP Workstation: [id number]   CT Angio Chest/Abd/Pel for Dissection W and/or Wo Contrast Result Date: 07/30/2024 CLINICAL DATA:  Chest pain, shortness of breath. EXAM: CT ANGIOGRAPHY CHEST, ABDOMEN AND PELVIS TECHNIQUE: Non-contrast CT of the chest was initially obtained. Multidetector CT imaging through the chest, abdomen and pelvis was performed using the standard protocol during  bolus administration of intravenous contrast. Multiplanar reconstructed images and MIPs were obtained and reviewed to evaluate the vascular anatomy. RADIATION DOSE REDUCTION: This exam was performed according to the departmental dose-optimization program which includes automated exposure control, adjustment of the mA and/or kV according to patient size and/or use of iterative reconstruction technique. CONTRAST:  100mL OMNIPAQUE IOHEXOL 350 MG/ML SOLN COMPARISON:  None Available. FINDINGS: CTA CHEST FINDINGS Cardiovascular: No evidence of an aortic intramural hematoma on precontrast imaging or aortic dissection or aneurysm on postcontrast imaging. Atherosclerotic calcification of the aorta, aortic valve and coronary arteries. Heart is enlarged. No pericardial effusion. Enlarged pulmonic trunk. Mediastinum/Nodes: No pathologically enlarged mediastinal, hilar or axillary lymph nodes. Air in the esophagus can be seen with dysmotility. Lungs/Pleura: Mild dependent atelectasis bilaterally. Pulmonary nodules measure 6 mm or less in size. No specific follow-up recommended in a patient of this age. No pleural fluid. Airway is unremarkable. Musculoskeletal: Degenerative changes in the spine. Review of the MIP images confirms the above findings. CTA ABDOMEN AND PELVIS FINDINGS VASCULAR Aorta: No aneurysm or dissection.  Atherosclerotic calcification. Celiac: Ostial calcification without significant luminal narrowing. Widely patent. SMA: Ostial calcification.  Widely patent. Renals: Ostial calcification bilaterally.  Widely patent. IMA: Widely patent. Inflow: Minimally ectatic but not aneurysmal. Atherosclerotic calcification. No dissection. Veins: Poorly evaluated due to lack of opacification. Review of the MIP images confirms the above findings. NON-VASCULAR Hepatobiliary: Liver is unremarkable. Gallstones. No biliary ductal dilatation. Pancreas: Negative. Spleen: Negative. Adrenals/Urinary Tract: Adrenal glands are  unremarkable. Low-attenuation lesions in the kidneys. No specific follow-up necessary. Renal cortical scarring and thinning bilaterally. Ureters are decompressed. Small right lateral bladder diverticulum. Stomach/Bowel: Small hiatal hernia. Stomach is otherwise unremarkable. Duodenal diverticula are incidentally noted. Small bowel and colon are otherwise unremarkable. Appendix is reportedly absent. Lymphatic: No pathologically enlarged lymph nodes. Reproductive: Prostate is visualized. Other: Small bilateral inguinal hernias contain fat. Small umbilical hernia contains fat. No free fluid. Musculoskeletal: Degenerative changes in the spine. Osteopenia. Mild levoconvex scoliosis. Review of the MIP images confirms the above findings. IMPRESSION: 1. No evidence of acute aortic syndrome or aneurysm. 2. Cholelithiasis. 3. Aortic atherosclerosis (ICD10-I70.0). Coronary artery calcification. 4. Enlarged pulmonic trunk, indicative of pulmonary arterial hypertension. Electronically Signed   By: Newell Eke M.D.   On: 07/30/2024 14:32   DG Chest Portable 1 View Result Date: 07/30/2024 CLINICAL DATA:  Substernal chest pain and shortness of breath. EXAM: PORTABLE CHEST 1 VIEW COMPARISON:  09/12/2009. FINDINGS: Trachea is  midline. Heart is enlarged. Lead less pacemaker projects over the left heart. Lungs are low in volume but clear. No pleural fluid. IMPRESSION: No acute findings. Electronically Signed   By: Newell Eke M.D.   On: 07/30/2024 14:22   CT HEAD WO CONTRAST ( ) Result Date: 07/27/2024 EXAM: CT HEAD WITHOUT CONTRAST 07/27/2024 09:18:04 AM TECHNIQUE: CT of the head was performed without the administration of intravenous contrast. Automated exposure control, iterative reconstruction, and/or weight based adjustment of the mA/kV was utilized to reduce the radiation dose to as low as reasonably achievable. COMPARISON: None available. CLINICAL HISTORY: Headache, new onset (Age >= 51y). FINDINGS: BRAIN AND  VENTRICLES: No acute hemorrhage. No evidence of acute infarct. No hydrocephalus. No extra-axial collection. No mass effect or midline shift. Age-related cerebral volume loss, not unusual for age. Mild periventricular and deep cerebral white matter disease. ORBITS: No acute abnormality. Status post bilateral lens replacement. SINUSES: Complete opacification of the right maxillary sinus, anterior ethmoid air cells and frontal sinuses. SOFT TISSUES AND SKULL: No acute soft tissue abnormality. No skull fracture. VASCULATURE: Moderate to severe atheromatous calcification within the carotid siphons and vertebral arteries. IMPRESSION: 1. No acute intracranial abnormality. 2. Complete opacification of the right maxillary sinus, anterior ethmoid air cells, and frontal sinuses. Electronically signed by: Evalene Coho MD 07/27/2024 09:25 AM EDT RP Workstation: [id number]    ECHO ordered  TELEMETRY (personally reviewed): Atrial fibrillation/intermittent ventricular pacing rate 70s  EKG (personally reviewed): ventricular pacing rate 94 bpm  Data reviewed by me 07/31/2024: last 24h vitals tele labs imaging I/O ED provider note, admission H&P, GI notes  Principal Problem:   Hypertensive emergency    ASSESSMENT AND PLAN:  David Frey is a 89 y.o. male  with a past medical history of sick sinus syndrome s/p Micra pacer implant 2020, chronic atrial fibrillation, hypertension, moderate mitral insufficiency, hyperlipidemia who presented to the ED on 07/30/2024 for chest pain. Trops mildly elevated. Lipase found to be significantly elevated and MRCP demonstrated acute pancreatitis. Cardiology was consulted for further evaluation.   # Acute pancreatitis # Hypertensive emergency # Demand ischemia # Atypical chest pain # Chronic atrial fibrillation # SSS s/p Micra leadless pacemaker 2020 Patient presented with chest pain, significantly elevated blood pressure.  Found to have significantly elevated lipase  suggestive of pancreatitis.  MRCP consistent with this.  Was started on IV nitroglycerin drip for BP, titrated down overnight but increased again due to complaints of chest discomfort.  Troponins are mildly elevated up to 78 with no acute ischemic changes on EKG.  Suspect chest discomfort is secondary to acute pancreatitis. -Echo ordered. Further recommendations pending results. -Increase amlodipine  to 5 mg BID. Wean of NTG gtt as able.  -Will switch Eliquis to heparin  in preparation for ERCP with GI. Per GI would prefer he is off eliquis 48 hours before proceeding. -Minimally elevated and flat troponin most consistent with demand/supply mismatch and not ACS in the setting of acute pancreatitis. No plan for further cardiac diagnostics at this time. -Pacemaker interrogated and device set to VVIR mode, pacing PRN as low rate set at 70 and intrinsic rate currently higher.  This patient's plan of care was discussed and created with Dr. Florencio and he is in agreement.  Signed: Danita Bloch, PA-C  07/31/2024, 10:04 AM [facility]

## 2024-07-31 NOTE — TOC Initial Note (Signed)
 Transition of Care [facility]) - Initial/Assessment Note    Patient Details  Name: David Frey MRN: [id number] Date of Birth: [birth year]  Transition of Care [facility]) CM/SW Contact:    Corrie JINNY Ruts, LCSW Phone Number: 07/31/2024, 12:42 PM  Clinical Narrative:                 Chart reviewed. The patient was admitted for Hypertensive Emergency. There was a consult for SNF placement. I was able to speak with the patient daughter and son at bedside today. Per chart review the patient daughter is POA. I introduced my self, my role, and reason for consult. The patient daughter reports that the patient has a PCP. The patient daughter confirms that the patient is from [facility] assistant living. The patient daughter reports that the patient was able to complete daily living task independently before being admitted into the hospital. The patient daughter reports that she patient drove himself to medical appointments. The patient daughter reports that she and the brother would help during D/C.   The patient daughter reports that the patient use [facility]. The patient daughter reports that the patient has never had HH or been admitted into a SNF in the past. The patient daughter reports that the patient has a cane, walker, and shower seat. The patient daughter confirms that the patient will return to [facility]. The patient daughter informed SW that [facility] has a SNF attached to it. The patient daughter reports that if SNF was the recommendation then she would like the patient to be placed there.   There are no other concerns at this time. TOC will follow the patient until D/C.     Barriers to Discharge: Continued Medical Work up   Patient Goals and CMS Choice            Expected Discharge Plan and Services       Living arrangements for the past 2 months: Assisted Living Facility                                      Prior Living Arrangements/Services Living arrangements for the  past 2 months: Assisted Living Facility Lives with:: Facility Resident Patient language and need for interpreter reviewed:: Yes        Need for Family Participation in Patient Care: Yes (Comment)     Criminal Activity/Legal Involvement Pertinent to Current Situation/Hospitalization: No - Comment as needed  Activities of Daily Living   ADL Screening (condition at time of admission) Independently performs ADLs?: Yes (appropriate for developmental age) Is the patient deaf or have difficulty hearing?: No Does the patient have difficulty seeing, even when wearing glasses/contacts?: No Does the patient have difficulty concentrating, remembering, or making decisions?: Yes  Permission Sought/Granted   Permission granted to share information with : Yes, Release of Information Signed        Permission granted to share info w Relationship: Daughter  Permission granted to share info w Contact Information: Giovany Cosby: [phone]  Emotional Assessment Appearance:: Appears stated age Attitude/Demeanor/Rapport: Unable to Assess Affect (typically observed): Unable to Assess   Alcohol / Substance Use: Not Applicable    Admission diagnosis:  Hyperbilirubinemia [E80.6] Acute respiratory failure with hypoxia (HCC) [J96.01] Hypertensive emergency [I16.1] Acute pancreatitis, unspecified complication status, unspecified pancreatitis type [K85.90] Patient Active Problem List   Diagnosis Date Noted   Acute pancreatitis 07/31/2024   Hyperbilirubinemia 07/31/2024   Hypertensive emergency  07/30/2024   GI bleed 03/14/2020   Rectal bleeding 03/13/2020   Acute renal failure superimposed on stage 3a chronic kidney disease (HCC) 03/13/2020   Bloody stool    Sick sinus syndrome (HCC) 12/18/2018   Dizziness 11/14/2017   Microscopic hematuria 10/12/2017   Pain of right upper arm 06/15/2017   Encounter for long-term (current) use of high-risk medication 02/09/2017   Bradycardia 11/30/2016   Chronic  gouty arthropathy without tophi 11/09/2016   Arthritis, degenerative 10/21/2015   Unsteadiness 10/01/2015   Asthma without status asthmaticus 12/13/2014   Benign prostatic hyperplasia without lower urinary tract symptoms 12/13/2014   Chronic obstructive pulmonary disease (HCC) 12/13/2014   Gout 12/13/2014   BP (high blood pressure) 12/13/2014   Obstruction to urinary outflow 12/13/2014   Impaired renal function 12/13/2014   Stasis, venous 12/13/2014   Benign essential HTN 11/28/2014   Blood glucose elevated 11/28/2014   Chronic diastolic CHF (congestive heart failure) (HCC) 07/09/2014   MI (mitral incompetence) 07/09/2014   Paroxysmal atrial fibrillation (HCC) 07/09/2014   Edema leg 04/01/2014   HLD (hyperlipidemia) 04/01/2014   Combined fat and carbohydrate induced hyperlipemia 04/01/2014   Breath shortness 04/01/2014   Benign prostatic hyperplasia with urinary obstruction 10/30/2012   Excessive urination at night 10/30/2012   FOM (frequency of micturition) 10/30/2012   Inflammation of joint of finger 07/11/2012   Methicillin susceptible Staphylococcus aureus infection 02/25/2010   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 [facility] - [place], [place] - [address] AT [facility] [address] [place] [address] Phone: [phone] Fax: [phone]  [facility] - [place], [place] - [place] [address] [place] [address] Phone: [phone] Fax: [phone]     Social Drivers of Health (SDOH) Social History: SDOH Screenings   Food Insecurity: No Food Insecurity (07/30/2024)  Housing: Low Risk  (07/30/2024)  Transportation Needs: No Transportation Needs  (07/30/2024)  Utilities: Not At Risk (07/30/2024)  Financial Resource Strain: Low Risk  (02/07/2024)   Received from [facility]  Social Connections: Socially Isolated (07/30/2024)  Tobacco Use: Low Risk  (07/30/2024)   SDOH Interventions: Housing Interventions: Patient Declined   Readmission Risk Interventions     No data to display

## 2024-07-31 NOTE — Plan of Care (Signed)

## 2024-07-31 NOTE — Hospital Course (Addendum)
 Hospital course / significant events:   David Frey is a 89 y.o. male [name] assisted living with a known history of asthma, A-fib on Eliquis, BPH, hypertension, COPD not on any O2 at home, chronic systolic heart failure with an EF 45%, hyperlipidemia, hypertension, CKD (question if this is documented in error), SSS s/p pacemaker placement, presents to the emergency department for evaluation of chest pain.  Patient came to ED 11/03 from [facility] assisted living via EMS, complaining of chest pain   HPI: Reports 10 out of 10 constant substernal chest pain with associated SOB that started when he woke up from a nap approx 10:45 on 11/03. EMS gave 3 sublingual nitroglycerin and 324 ASA. EMS reports pt was hypertensive at 206/123    11/03: to ED, hypoxic to 88% on room air, BP 194/117, AST 266, ALT 116, ALP 173, TBili 4.0, Lipase >2800, BNO 404, Troponin 35 --> 36. CTA C/A/P (+)cholelithiasis. MRCP (+)pancreatitis, large GB neck stone 2.7 cm w/ layering sludge w/ equivocal GB wall thickening, mild biliary duct dilatation 9 mm. To stepdown on NTG gtt, BP improved, remains on 2-4L O2 [place].  11/04: WBC 12.3, Troponin 78, AST 236, AT 179, TBIli 5.9. Holding Eliquis - last dose 00:14 11/04. GI following - not good ERCP candidate, rec IR to eval for cholecystotomy.  Cardiology following - echo pending, pacer interrogated and ok, continue on heparin  drip, increase amlodipine , wean off ntg as able.        Consultants:  Gastroenterology Cardiology  Interventional radiology  Procedures/Surgeries: None      ASSESSMENT & PLAN:   Acute pancreatitis w/ elevated lipase Possible Choledocholithiasis w/ Stone already passed spontaneously Transaminitis w/ elevated LFTs AST ALT TBIli  Now w/ elevated WBC and continued tachypnea = sepsis 07/31/24 GI following, per Dr Jinny - not good ERCP candidate GI recs for IR to eval for cholecystotomy.   Added Zosyn 07/31/24 AM Holding Eliquis -  last dose 00:14  11/04. Ideally would like off this for 2 days prior to ERCP  Cardiology clearance requested prior to any procedures    Severe uncontrolled HTN TSH WNL Question d/t pain Cardiology following  echo pending increase amlodipine  wean off ntg as able   Chest pain and mildly elevated troponin  likely demand ischemia d/t pancreatitis Have reasonably excluded ACS Consider d/t intermittently rapid HR, pacemaker put in MRI mode and HR has been labile since then Question NSTEMI as troponin slightly uptrending  Cardiology following --> Minimally elevated and flat troponin most consistent with demand/supply mismatch and not ACS in the setting of acute pancreatitis.   Echo pending Pacemaker interrogation - ok trend troponin to peak TSH --> WNL Lipids --> no concerns    Hypoxia, new, unclear etiology possibly related to history of asthma/COPD, demand from hypertension, possible pulm arterial HTN as seen on CT O2 and nebs PRN Check respiratory path panel --> no COVID/Flu/RSV   History of afib Hold home Eliquis Continue on heparin  gtt as above Continuous telemetry    History of CHF Strict intake and output Cardiology following   History of CKD but baseline GFR 60+ so this may be error Monitor BMP   History of BPH tamsulosin , Toviaz   Goals of care Advanced care planning Discussion at bedside today with patient and daughter.   Patient states he would not want to go through risky or complicated surgical procedure, he does not want CPR in the event of cardiac arrest, he does not want intubation in the event of respiratory arrest  but is fine for intubation as part of anesthesia is needed for relatively straightforward procedure such as ERCP or cholecystostomy Daughter is present and is in agreement with the above to follow her dad's wishes CODE STATUS updated, DNR/DNI     Overweight based on BMI: Body mass index is 28.57 kg/m.SABRA Significantly low or high BMI is associated with higher  medical risk.  Underweight - under 18  overweight - 25 to 29 obese - 30 or more Class 1 obesity: BMI of 30.0 to 34 Class 2 obesity: BMI of 35.0 to 39 Class 3 obesity: BMI of 40.0 to 49 Super Morbid Obesity: BMI 50-59 Super-super Morbid Obesity: BMI 60+ Healthy nutrition and physical activity advised as adjunct to other disease management and risk reduction treatments    DVT prophylaxis: heparin  IV fluids: holding continuous IV fluids  Nutrition: NPO pend possible ERCP or cholecystostomy, if no procedures planned today could consider clear liquid diet Central lines / other devices: None  Code Status: DNR -see above regarding advance care planning ACP documentation reviewed: none on file in [facility]  TOC needs: TD Medical barriers to dispo: critical illness as above. Expected medical readiness for discharge 3-4 days at minimum.

## 2024-07-31 NOTE — Progress Notes (Signed)
 PROGRESS NOTE    David Frey   FMW:[id number] DOB: 1930-01-19  DOA: 07/30/2024 Date of Service: 07/31/24 which is hospital day 1  PCP: Lenon Layman ORN, MD    Hospital course / significant events:   David Frey is a 89 y.o. male [name] with a known history of asthma, A-fib on Eliquis, BPH, hypertension, COPD not on any O2 at home, chronic systolic heart failure with an EF 45%, hyperlipidemia, hypertension, CKD (question if this is documented in error), SSS s/p pacemaker placement, presents to the emergency department for evaluation of chest pain.  Patient came to ED 11/03 from [facility] via EMS, complaining of chest pain   HPI: Reports 10 out of 10 constant substernal chest pain with associated SOB that started when he woke up from a nap approx 10:45 on 11/03. EMS gave 3 sublingual nitroglycerin and 324 ASA. EMS reports pt was hypertensive at 206/123    11/03: to ED, hypoxic to 88% on room air, BP 194/117, AST 266, ALT 116, ALP 173, TBili 4.0, Lipase >2800, BNO 404, Troponin 35 --> 36. CTA C/A/P (+)cholelithiasis. MRCP (+)pancreatitis, large GB neck stone 2.7 cm w/ layering sludge w/ equivocal GB wall thickening, mild biliary duct dilatation 9 mm. To stepdown on NTG gtt, BP improved, remains on 2-4L O2 [place].  11/04: WBC 12.3, Troponin 78, AST 236, AT 179, TBIli 5.9. Holding Eliquis - last dose 00:14 11/04. GI following - not good ERCP candidate, rec IR to eval for cholecystotomy.  Cardiology following - echo pending, pacer interrogated and ok, continue on heparin  drip, increase amlodipine , wean off ntg as able.        Consultants:  Gastroenterology Cardiology  Interventional radiology  Procedures/Surgeries: None      ASSESSMENT & PLAN:   Acute pancreatitis w/ elevated lipase Possible Choledocholithiasis w/ Stone already passed spontaneously Transaminitis w/ elevated LFTs AST ALT TBIli  Now w/ elevated WBC and continued tachypnea =  sepsis 07/31/24 GI following, per Dr Jinny - not good ERCP candidate GI recs for IR to eval for cholecystotomy.   Added Zosyn 07/31/24 AM Holding Eliquis -  last dose 00:14 11/04. Ideally would like off this for 2 days prior to ERCP  Cardiology clearance requested prior to any procedures    Severe uncontrolled HTN TSH WNL Question d/t pain Cardiology following  echo pending increase amlodipine  wean off ntg as able   Chest pain and mildly elevated troponin  likely demand ischemia d/t pancreatitis Have reasonably excluded ACS Consider d/t intermittently rapid HR, pacemaker put in MRI mode and HR has been labile since then Question NSTEMI as troponin slightly uptrending  Cardiology following --> Minimally elevated and flat troponin most consistent with demand/supply mismatch and not ACS in the setting of acute pancreatitis.   Echo pending Pacemaker interrogation - ok trend troponin to peak TSH --> WNL Lipids --> no concerns    Hypoxia, new, unclear etiology possibly related to history of asthma/COPD, demand from hypertension, possible pulm arterial HTN as seen on CT O2 and nebs PRN Check respiratory path panel --> no COVID/Flu/RSV   History of afib Hold home Eliquis Continue on heparin  gtt as above Continuous telemetry    History of CHF Strict intake and output Cardiology following   History of CKD but baseline GFR 60+ so this may be error Monitor BMP   History of BPH tamsulosin , Toviaz   Goals of care Advanced care planning Discussion at bedside today with patient and daughter.   Patient states  he would not want to go through risky or complicated surgical procedure, he does not want CPR in the event of cardiac arrest, he does not want intubation in the event of respiratory arrest but is fine for intubation as part of anesthesia is needed for relatively straightforward procedure such as ERCP or cholecystostomy Daughter is present and is in agreement with the above to  follow her dad's wishes CODE STATUS updated, DNR/DNI     Overweight based on BMI: Body mass index is 28.57 kg/m.SABRA Significantly low or high BMI is associated with higher medical risk.  Underweight - under 18  overweight - 25 to 29 obese - 30 or more Class 1 obesity: BMI of 30.0 to 34 Class 2 obesity: BMI of 35.0 to 39 Class 3 obesity: BMI of 40.0 to 49 Super Morbid Obesity: BMI 50-59 Super-super Morbid Obesity: BMI 60+ Healthy nutrition and physical activity advised as adjunct to other disease management and risk reduction treatments    DVT prophylaxis: heparin  IV fluids: holding continuous IV fluids  Nutrition: NPO pend possible ERCP or cholecystostomy, if no procedures planned today could consider clear liquid diet Central lines / other devices: None  Code Status: DNR -see above regarding advance care planning ACP documentation reviewed: none on file in [facility]  TOC needs: TD Medical barriers to dispo: critical illness as above. Expected medical readiness for discharge 3-4 days at minimum.              Subjective / Brief ROS:  Patient reports pain is controlled at this time Denies CP/SOB.  Pain controlled.  Denies new weakness.  Reports no concerns w/ urination/defecation.   Family Communication: Daughter is present at bedside    Objective Findings:  Vitals:   07/31/24 1224 07/31/24 1225 07/31/24 1226 07/31/24 1227  BP: (!) 102/58     Pulse: 72 73 72 73  Resp: (!) 23 (!) 21 (!) 23 (!) 22  Temp:      TempSrc:      SpO2: 98% 96% 97% 97%  Weight:      Height:        Intake/Output Summary (Last 24 hours) at 07/31/2024 1339 Last data filed at 07/31/2024 1200 Gross per 24 hour  Intake 1603.64 ml  Output 550 ml  Net 1053.64 ml   Filed Weights   07/30/24 1234 07/30/24 1241  Weight: 80 kg 80.3 kg    Examination:  Physical Exam Constitutional:      General: He is not in acute distress. Cardiovascular:     Rate and Rhythm: Normal rate and regular  rhythm.     Heart sounds: Normal heart sounds.  Pulmonary:     Effort: Pulmonary effort is normal. No accessory muscle usage.     Breath sounds: Normal breath sounds.  Abdominal:     Palpations: Abdomen is soft.     Tenderness: There is abdominal tenderness.  Musculoskeletal:     Right lower leg: No edema.     Left lower leg: No edema.  Neurological:     Mental Status: He is alert.  Psychiatric:        Mood and Affect: Mood normal.        Behavior: Behavior normal.          Scheduled Medications:   amLODipine   5 mg Oral BID   brimonidine   1 drop Left Eye BID   And   timolol   1 drop Left Eye BID   [START ON 08/01/2024] calcium  carbonate  1,500 mg  Oral Once per day on Monday Wednesday Friday   Chlorhexidine Gluconate Cloth  6 each Topical Daily   fesoterodine   4 mg Oral Daily   latanoprost   1 drop Both Eyes QHS   metoprolol tartrate  12.5 mg Oral BID   sodium chloride  flush  3 mL Intravenous Q12H   tamsulosin   0.4 mg Oral Daily    Continuous Infusions:  heparin  1,250 Units/hr (07/31/24 1200)   lactated ringers 75 mL/hr at 07/31/24 1200   nitroGLYCERIN Stopped (07/31/24 1145)   piperacillin-tazobactam Stopped (07/31/24 1121)    PRN Medications:  bisacodyl, ibuprofen, ipratropium-albuterol , morphine injection, ondansetron  **OR** ondansetron  (ZOFRAN ) IV, mouth rinse, oxyCODONE , senna-docusate, traZODone  Antimicrobials from admission:  Anti-infectives (From admission, onward)    Start     Dose/Rate Route Frequency Ordered Stop   07/31/24 0815  piperacillin-tazobactam (ZOSYN) IVPB 3.375 g        3.375 g 12.5 mL/hr over 240 Minutes Intravenous Every 8 hours 07/31/24 0716             Data Reviewed:  I have personally reviewed the following...  CBC: Recent Labs  Lab 07/27/24 0839 07/30/24 1230 07/31/24 0130  WBC 7.0 7.8 12.3*  HGB 14.4 13.9 11.4*  HCT 43.6 43.1 34.8*  MCV 97.3 99.1 97.5  PLT 141* 161 139*   Basic Metabolic Panel: Recent Labs  Lab  07/27/24 0839 07/30/24 1230 07/31/24 0130  NA 141 138 138  K 3.8 3.9 4.2  CL 102 99 100  CO2 25 27 26   GLUCOSE 110* 113* 125*  BUN 21 24* 29*  CREATININE 1.03 1.15 1.14  CALCIUM  9.2 9.0 8.6*  MG  --   --  1.7   GFR: Estimated Creatinine Clearance: 41.2 mL/min (by C-G formula based on SCr of 1.14 mg/dL). Liver Function Tests: Recent Labs  Lab 07/27/24 0839 07/30/24 1230 07/31/24 0130  AST 20 266* 236*  ALT 10 116* 179*  ALKPHOS 72 173* 180*  BILITOT 1.3* 4.0* 5.9*  PROT 8.6* 8.5* 6.9  ALBUMIN 3.5 3.1* 2.5*   Recent Labs  Lab 07/30/24 1230  LIPASE >2,800*   No results for input(s): AMMONIA in the last 168 hours. Coagulation Profile: Recent Labs  Lab 07/30/24 1230  INR 1.5*   Cardiac Enzymes: No results for input(s): CKTOTAL, CKMB, CKMBINDEX, TROPONINI in the last 168 hours. BNP (last 3 results) No results for input(s): PROBNP in the last 8760 hours. HbA1C: No results for input(s): HGBA1C in the last 72 hours. CBG: Recent Labs  Lab 07/30/24 1742  GLUCAP 105*   Lipid Profile: Recent Labs    07/30/24 1942  CHOL 91  HDL 36*  LDLCALC 47  TRIG 42  CHOLHDL 2.5   Thyroid Function Tests: Recent Labs    07/30/24 1942  TSH 1.103   Anemia Panel: No results for input(s): VITAMINB12, FOLATE, FERRITIN, TIBC, IRON, RETICCTPCT in the last 72 hours. Most Recent Urinalysis On File:     Component Value Date/Time   COLORURINE YELLOW (A) 07/27/2024 1013   APPEARANCEUR CLEAR (A) 07/27/2024 1013   APPEARANCEUR Clear 02/10/2022 1305   LABSPEC 1.013 07/27/2024 1013   LABSPEC 1.023 12/05/2013 0946   PHURINE 6.0 07/27/2024 1013   GLUCOSEU NEGATIVE 07/27/2024 1013   GLUCOSEU Negative 12/05/2013 0946   HGBUR NEGATIVE 07/27/2024 1013   BILIRUBINUR NEGATIVE 07/27/2024 1013   BILIRUBINUR Negative 02/10/2022 1305   BILIRUBINUR Negative 12/05/2013 0946   KETONESUR NEGATIVE 07/27/2024 1013   PROTEINUR 100 (A) 07/27/2024 1013   NITRITE  NEGATIVE  07/27/2024 1013   LEUKOCYTESUR NEGATIVE 07/27/2024 1013   LEUKOCYTESUR Negative 12/05/2013 0946   Sepsis Labs: @LABRCNTIP (procalcitonin:4,lacticidven:4) Microbiology: Recent Results (from the past 240 hours)  Resp panel by RT-PCR (RSV, Flu A&B, Covid) Anterior Nasal Swab     Status: None   Collection Time: 07/30/24  2:51 PM   Specimen: Anterior Nasal Swab  Result Value Ref Range Status   SARS Coronavirus 2 by RT PCR NEGATIVE NEGATIVE Final    Comment: (NOTE) SARS-CoV-2 target nucleic acids are NOT DETECTED.  The SARS-CoV-2 RNA is generally detectable in upper respiratory specimens during the acute phase of infection. The lowest concentration of SARS-CoV-2 viral copies this assay can detect is 138 copies/mL. A negative result does not preclude SARS-Cov-2 infection and should not be used as the sole basis for treatment or other patient management decisions. A negative result may occur with  improper specimen collection/handling, submission of specimen other than nasopharyngeal swab, presence of viral mutation(s) within the areas targeted by this assay, and inadequate number of viral copies(<138 copies/mL). A negative result must be combined with clinical observations, patient history, and epidemiological information. The expected result is Negative.  Fact Sheet for Patients:  bloggercourse.com  Fact Sheet for Healthcare Providers:  seriousbroker.it  This test is no t yet approved or cleared by the [place]  FDA and  has been authorized for detection and/or diagnosis of SARS-CoV-2 by FDA under an Emergency Use Authorization (EUA). This EUA will remain  in effect (meaning this test can be used) for the duration of the COVID-19 declaration under Section 564(b)(1) of the Act, 21 U.S.C.section 360bbb-3(b)(1), unless the authorization is terminated  or revoked sooner.       Influenza A by PCR NEGATIVE NEGATIVE Final    Influenza B by PCR NEGATIVE NEGATIVE Final    Comment: (NOTE) The Xpert Xpress SARS-CoV-2/FLU/RSV plus assay is intended as an aid in the diagnosis of influenza from Nasopharyngeal swab specimens and should not be used as a sole basis for treatment. Nasal washings and aspirates are unacceptable for Xpert Xpress SARS-CoV-2/FLU/RSV testing.  Fact Sheet for Patients: bloggercourse.com  Fact Sheet for Healthcare Providers: seriousbroker.it  This test is not yet approved or cleared by the [place]  FDA and has been authorized for detection and/or diagnosis of SARS-CoV-2 by FDA under an Emergency Use Authorization (EUA). This EUA will remain in effect (meaning this test can be used) for the duration of the COVID-19 declaration under Section 564(b)(1) of the Act, 21 U.S.C. section 360bbb-3(b)(1), unless the authorization is terminated or revoked.     Resp Syncytial Virus by PCR NEGATIVE NEGATIVE Final    Comment: (NOTE) Fact Sheet for Patients: bloggercourse.com  Fact Sheet for Healthcare Providers: seriousbroker.it  This test is not yet approved or cleared by the [place]  FDA and has been authorized for detection and/or diagnosis of SARS-CoV-2 by FDA under an Emergency Use Authorization (EUA). This EUA will remain in effect (meaning this test can be used) for the duration of the COVID-19 declaration under Section 564(b)(1) of the Act, 21 U.S.C. section 360bbb-3(b)(1), unless the authorization is terminated or revoked.  Performed at [facility], [address] [place], [place] [address]   MRSA Next Gen by PCR, Nasal     Status: None   Collection Time: 07/30/24  5:42 PM   Specimen: Nasal Mucosa; Nasal Swab  Result Value Ref Range Status   MRSA by PCR Next Gen NOT DETECTED NOT DETECTED Final    Comment: (NOTE) The GeneXpert MRSA  Assay (FDA approved for NASAL  specimens only), is one component of a comprehensive MRSA colonization surveillance program. It is not intended to diagnose MRSA infection nor to guide or monitor treatment for MRSA infections. Test performance is not FDA approved in patients less than 19 years old. Performed at [facility], [address] [place], [place] [address]   Respiratory (~20 pathogens) panel by PCR     Status: None   Collection Time: 07/30/24 11:52 PM   Specimen: Nasopharyngeal Swab; Respiratory  Result Value Ref Range Status   Adenovirus NOT DETECTED NOT DETECTED Final   Coronavirus 229E NOT DETECTED NOT DETECTED Final    Comment: (NOTE) The Coronavirus on the Respiratory Panel, DOES NOT test for the novel  Coronavirus (2019 nCoV)    Coronavirus HKU1 NOT DETECTED NOT DETECTED Final   Coronavirus NL63 NOT DETECTED NOT DETECTED Final   Coronavirus OC43 NOT DETECTED NOT DETECTED Final   Metapneumovirus NOT DETECTED NOT DETECTED Final   Rhinovirus / Enterovirus NOT DETECTED NOT DETECTED Final   Influenza A NOT DETECTED NOT DETECTED Final   Influenza B NOT DETECTED NOT DETECTED Final   Parainfluenza Virus 1 NOT DETECTED NOT DETECTED Final   Parainfluenza Virus 2 NOT DETECTED NOT DETECTED Final   Parainfluenza Virus 3 NOT DETECTED NOT DETECTED Final   Parainfluenza Virus 4 NOT DETECTED NOT DETECTED Final   Respiratory Syncytial Virus NOT DETECTED NOT DETECTED Final   Bordetella pertussis NOT DETECTED NOT DETECTED Final   Bordetella Parapertussis NOT DETECTED NOT DETECTED Final   Chlamydophila pneumoniae NOT DETECTED NOT DETECTED Final   Mycoplasma pneumoniae NOT DETECTED NOT DETECTED Final    Comment: Performed at [facility], [address] [place], [place] [address]      Radiology Studies last 3 days: MR ABDOMEN MRCP W WO CONTAST Result Date: 07/31/2024 EXAM: MRCP WITH AND WITHOUT IV CONTRAST 07/30/2024 10:57:39 PM TECHNIQUE: Multisequence, multiplanar magnetic resonance images of  the abdomen with and without intravenous contrast. MRCP sequences were performed. 8 mL (gadobutrol (GADAVIST) 1 MMOL/ML injection 8 mL GADOBUTROL 1 MMOL/ML IV SOLN). COMPARISON: None available. CLINICAL HISTORY: Pancreatitis, hepatobiliary obstruction, c/f choledocholithiasis. FINDINGS: LIVER: There is relative hypertrophy of the caudate lobe and lateral segment of left lobe of liver. No focal liver lesion identified. GALLBLADDER AND BILIARY SYSTEM: Large stone identified within the gallbladder neck measures 2.7 cm, image 15/4. Layering gallbladder sludge is also identified. Equivocal gallbladder wall thickening measuring up to 3 mm, image 29/26. The MRCP images are significantly motion degraded. Intermediate T2 signal is noted in the common bile duct. The common bile duct measures up to 9 mm and there is intermediate T1 and T2 signal throughout the course of the common bile duct which is likely filled with gallbladder sludge. Mild central biliary dilatation identified. No signs of choledocholithiasis. SPLEEN: Normal appearance of the spleen. PANCREAS/PANCREATIC DUCT: The main pancreatic duct is upper limits of normal measuring 3 mm. Diffuse edema throughout the course of the pancreas is identified concerning for acute pancreatitis. No signs of pancreatic necrosis or pseudocyst. ADRENAL GLANDS: Normal size and morphology bilaterally. No nodule, thickening, or hemorrhage. No periadrenal stranding. KIDNEYS: Exophytic cyst of the upper pole of right kidney measures 1.9 cm; this is uniformly T2 hyperintense without internal enhancement. No follow-up imaging recommended. Left renal cortical scarring. LYMPH NODES: No adenopathy identified. VASCULATURE: The upper abdomen vascularity appears patent. Aortic atherosclerotic calcifications. PERITONEUM: Small volume of right upper quadrant, perihepatic, and pericholecystic ascites. ABDOMINAL WALL: Fat containing, midline  ventral abdominal wall hernia measures 3.2 cm. BOWEL:  Grossly unremarkable. No bowel obstruction. BONES: Thoracolumbar scoliosis deformity with convexity towards the left. Lumbar degenerative disc disease. No acute abnormality or worrisome osseous lesion. SOFT TISSUES: Unremarkable. MISCELLANEOUS: Cardiac enlargement. IMPRESSION: 1. Acute pancreatitis with diffuse pancreatic edema. No pancreatic necrosis or pseudocyst. 2. Large gallbladder neck stone measuring 2.7 cm with layering sludge. Equivocal gallbladder wall thickening. 3. Mild central biliary dilatation with common bile duct measuring up to 9 mm, likely filled with sludge. No choledocholithiasis identified; evaluation limited by motion artifact. 4. Small volume perihepatic and pericholecystic ascites. Electronically signed by: Waddell Calk MD 07/31/2024 05:23 AM EST RP Workstation: [id number]   MR 3D Recon At Scanner Result Date: 07/31/2024 EXAM: MRCP WITH AND WITHOUT IV CONTRAST 07/30/2024 10:57:39 PM TECHNIQUE: Multisequence, multiplanar magnetic resonance images of the abdomen with and without intravenous contrast. MRCP sequences were performed. 8 mL (gadobutrol (GADAVIST) 1 MMOL/ML injection 8 mL GADOBUTROL 1 MMOL/ML IV SOLN). COMPARISON: None available. CLINICAL HISTORY: Pancreatitis, hepatobiliary obstruction, c/f choledocholithiasis. FINDINGS: LIVER: There is relative hypertrophy of the caudate lobe and lateral segment of left lobe of liver. No focal liver lesion identified. GALLBLADDER AND BILIARY SYSTEM: Large stone identified within the gallbladder neck measures 2.7 cm, image 15/4. Layering gallbladder sludge is also identified. Equivocal gallbladder wall thickening measuring up to 3 mm, image 29/26. The MRCP images are significantly motion degraded. Intermediate T2 signal is noted in the common bile duct. The common bile duct measures up to 9 mm and there is intermediate T1 and T2 signal throughout the course of the common bile duct which is likely filled with gallbladder sludge. Mild central  biliary dilatation identified. No signs of choledocholithiasis. SPLEEN: Normal appearance of the spleen. PANCREAS/PANCREATIC DUCT: The main pancreatic duct is upper limits of normal measuring 3 mm. Diffuse edema throughout the course of the pancreas is identified concerning for acute pancreatitis. No signs of pancreatic necrosis or pseudocyst. ADRENAL GLANDS: Normal size and morphology bilaterally. No nodule, thickening, or hemorrhage. No periadrenal stranding. KIDNEYS: Exophytic cyst of the upper pole of right kidney measures 1.9 cm; this is uniformly T2 hyperintense without internal enhancement. No follow-up imaging recommended. Left renal cortical scarring. LYMPH NODES: No adenopathy identified. VASCULATURE: The upper abdomen vascularity appears patent. Aortic atherosclerotic calcifications. PERITONEUM: Small volume of right upper quadrant, perihepatic, and pericholecystic ascites. ABDOMINAL WALL: Fat containing, midline ventral abdominal wall hernia measures 3.2 cm. BOWEL: Grossly unremarkable. No bowel obstruction. BONES: Thoracolumbar scoliosis deformity with convexity towards the left. Lumbar degenerative disc disease. No acute abnormality or worrisome osseous lesion. SOFT TISSUES: Unremarkable. MISCELLANEOUS: Cardiac enlargement. IMPRESSION: 1. Acute pancreatitis with diffuse pancreatic edema. No pancreatic necrosis or pseudocyst. 2. Large gallbladder neck stone measuring 2.7 cm with layering sludge. Equivocal gallbladder wall thickening. 3. Mild central biliary dilatation with common bile duct measuring up to 9 mm, likely filled with sludge. No choledocholithiasis identified; evaluation limited by motion artifact. 4. Small volume perihepatic and pericholecystic ascites. Electronically signed by: Waddell Calk MD 07/31/2024 05:23 AM EST RP Workstation: [id number]   CT Angio Chest/Abd/Pel for Dissection W and/or Wo Contrast Result Date: 07/30/2024 CLINICAL DATA:  Chest pain, shortness of breath. EXAM: CT  ANGIOGRAPHY CHEST, ABDOMEN AND PELVIS TECHNIQUE: Non-contrast CT of the chest was initially obtained. Multidetector CT imaging through the chest, abdomen and pelvis was performed using the standard protocol during bolus administration of intravenous contrast. Multiplanar reconstructed images and MIPs were obtained and reviewed to evaluate the vascular anatomy. RADIATION DOSE REDUCTION: This exam was  performed according to the departmental dose-optimization program which includes automated exposure control, adjustment of the mA and/or kV according to patient size and/or use of iterative reconstruction technique. CONTRAST:  100mL OMNIPAQUE IOHEXOL 350 MG/ML SOLN COMPARISON:  None Available. FINDINGS: CTA CHEST FINDINGS Cardiovascular: No evidence of an aortic intramural hematoma on precontrast imaging or aortic dissection or aneurysm on postcontrast imaging. Atherosclerotic calcification of the aorta, aortic valve and coronary arteries. Heart is enlarged. No pericardial effusion. Enlarged pulmonic trunk. Mediastinum/Nodes: No pathologically enlarged mediastinal, hilar or axillary lymph nodes. Air in the esophagus can be seen with dysmotility. Lungs/Pleura: Mild dependent atelectasis bilaterally. Pulmonary nodules measure 6 mm or less in size. No specific follow-up recommended in a patient of this age. No pleural fluid. Airway is unremarkable. Musculoskeletal: Degenerative changes in the spine. Review of the MIP images confirms the above findings. CTA ABDOMEN AND PELVIS FINDINGS VASCULAR Aorta: No aneurysm or dissection.  Atherosclerotic calcification. Celiac: Ostial calcification without significant luminal narrowing. Widely patent. SMA: Ostial calcification.  Widely patent. Renals: Ostial calcification bilaterally.  Widely patent. IMA: Widely patent. Inflow: Minimally ectatic but not aneurysmal. Atherosclerotic calcification. No dissection. Veins: Poorly evaluated due to lack of opacification. Review of the MIP  images confirms the above findings. NON-VASCULAR Hepatobiliary: Liver is unremarkable. Gallstones. No biliary ductal dilatation. Pancreas: Negative. Spleen: Negative. Adrenals/Urinary Tract: Adrenal glands are unremarkable. Low-attenuation lesions in the kidneys. No specific follow-up necessary. Renal cortical scarring and thinning bilaterally. Ureters are decompressed. Small right lateral bladder diverticulum. Stomach/Bowel: Small hiatal hernia. Stomach is otherwise unremarkable. Duodenal diverticula are incidentally noted. Small bowel and colon are otherwise unremarkable. Appendix is reportedly absent. Lymphatic: No pathologically enlarged lymph nodes. Reproductive: Prostate is visualized. Other: Small bilateral inguinal hernias contain fat. Small umbilical hernia contains fat. No free fluid. Musculoskeletal: Degenerative changes in the spine. Osteopenia. Mild levoconvex scoliosis. Review of the MIP images confirms the above findings. IMPRESSION: 1. No evidence of acute aortic syndrome or aneurysm. 2. Cholelithiasis. 3. Aortic atherosclerosis (ICD10-I70.0). Coronary artery calcification. 4. Enlarged pulmonic trunk, indicative of pulmonary arterial hypertension. Electronically Signed   By: Newell Eke M.D.   On: 07/30/2024 14:32   DG Chest Portable 1 View Result Date: 07/30/2024 CLINICAL DATA:  Substernal chest pain and shortness of breath. EXAM: PORTABLE CHEST 1 VIEW COMPARISON:  09/12/2009. FINDINGS: Trachea is midline. Heart is enlarged. Lead less pacemaker projects over the left heart. Lungs are low in volume but clear. No pleural fluid. IMPRESSION: No acute findings. Electronically Signed   By: Newell Eke M.D.   On: 07/30/2024 14:22       David Marietta, DO [facility] 07/31/2024, 1:39 PM    Dictation software may have been used to generate the above note. Typos may occur and escape review in typed/dictated notes. Please contact Dr Marsa directly for clarity if  needed.  Staff may message me via secure chat in Epic  but this may not receive an immediate response,  please page me for urgent matters!  If 7PM-7AM, please contact night coverage [email or web address]

## 2024-07-31 NOTE — Progress Notes (Signed)
 PHARMACY - ANTICOAGULATION CONSULT NOTE  Pharmacy Consult for heparin  infusion Indication: atrial fibrillation  Allergies  Allergen Reactions   Celecoxib Rash and Other (See Comments)    Hallucination    Patient Measurements: Height: 5' 6 (167.6 cm) Weight: 80.3 kg (177 lb) IBW/kg (Calculated) : 63.8 HEPARIN  DW (KG): 79.8  Vital Signs: Temp: 98.6 F (37 C) (11/04 0400) Temp Source: Oral (11/04 0400) BP: 139/94 (11/04 0600) Pulse Rate: 85 (11/04 0600)  Labs: Recent Labs    07/30/24 1230 07/30/24 1451 07/30/24 1942 07/30/24 2245 07/31/24 0130 07/31/24 0645  HGB 13.9  --   --   --  11.4*  --   HCT 43.1  --   --   --  34.8*  --   PLT 161  --   --   --  139*  --   LABPROT 18.6*  --   --   --   --   --   INR 1.5*  --   --   --   --   --   CREATININE 1.15  --   --   --  1.14  --   TROPONINIHS 35*   < > 64* 69*  --  78*   < > = values in this interval not displayed.    Estimated Creatinine Clearance: 41.2 mL/min (by C-G formula based on SCr of 1.14 mg/dL).   Medical History: Past Medical History:  Diagnosis Date   Actinic keratosis    Arthritis, degenerative 10/21/2015   Asthma without status asthmaticus 12/13/2014   Atrial fibrillation (HCC)    Benign fibroma of prostate 12/13/2014   Benign prostatic hyperplasia with urinary obstruction 10/30/2012   Blood glucose elevated 11/28/2014   BP (high blood pressure) 12/13/2014   Chronic obstructive pulmonary disease (HCC) 12/13/2014   Chronic systolic heart failure (HCC) 07/09/2014   Overview:  Global ef 45%    Combined fat and carbohydrate induced hyperlipemia 04/01/2014   Edema leg 04/01/2014   Excessive urination at night 10/30/2012   Gout 12/13/2014   Hypertension    Impaired renal function 12/13/2014   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, implant, and graft 05/13/2007   Overview:  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    Methicillin susceptible Staphylococcus aureus infection  02/25/2010   Overview:  Methicillin Susceptible Staphylococcus Aureus Infection    MI (mitral incompetence) 07/09/2014   Obstruction to urinary outflow 12/13/2014   Paroxysmal atrial fibrillation (HCC) 07/09/2014    Medications:  Scheduled:   amLODipine   5 mg Oral Daily   brimonidine   1 drop Left Eye BID   And   timolol   1 drop Left Eye BID   [START ON 08/01/2024] calcium  carbonate  1,500 mg Oral Once per day on Monday Wednesday Friday   Chlorhexidine Gluconate Cloth  6 each Topical Daily   fesoterodine   4 mg Oral Daily   latanoprost   1 drop Both Eyes QHS   sodium chloride  flush  3 mL Intravenous Q12H   tamsulosin   0.4 mg Oral Daily   Infusions:   lactated ringers Stopped (07/31/24 0321)   nitroGLYCERIN 50 mcg/min (07/31/24 0400)   piperacillin-tazobactam     PRN: acetaminophen  **OR** acetaminophen , bisacodyl, HYDROcodone-acetaminophen , ipratropium-albuterol , morphine injection, ondansetron  **OR** ondansetron  (ZOFRAN ) IV, mouth rinse, senna-docusate, traZODone  Assessment: 89 y.o. male [name] with a known history of asthma A-fib BPH hypertension COPD chronic systolic heart failure with an EF 45%, hyperlipidemia, hypertension, CKD presents to the emergency department for  evaluation of chest pain. Last apixaban dose 07/31/24 0014  Goal of Therapy:  anti-Xa level 0.3-0.7 units/ml aPTT 66 - 102 seconds Monitor platelets by anticoagulation protocol: Yes   Plan:  --Start heparin  infusion at 1250 units/hr (no loading dose ISO recent apixaban administration) beginning 12 hours after last apixaban dose --Check aPTT level in 8 hours (we will use aPTT to guide therapy until aPTT and anti-Xa level correlate)  --Continue to monitor H&H and platelets  Adriana JONETTA Bolster 07/31/2024,7:55 AM

## 2024-07-31 NOTE — Progress Notes (Signed)
 PHARMACY - ANTICOAGULATION CONSULT NOTE  Pharmacy Consult for heparin  infusion Indication: atrial fibrillation  Allergies  Allergen Reactions   Celecoxib Rash and Other (See Comments)    Hallucination    Patient Measurements: Height: 5' 6 (167.6 cm) Weight: 80.3 kg (177 lb) IBW/kg (Calculated) : 63.8 HEPARIN  DW (KG): 79.8  Vital Signs: Temp: 97.9 F (36.6 C) (11/04 2000) Temp Source: Oral (11/04 2000) BP: 152/98 (11/04 2117) Pulse Rate: 79 (11/04 2117)  Labs: Recent Labs    07/30/24 1230 07/30/24 1451 07/30/24 1942 07/30/24 2245 07/31/24 0130 07/31/24 0645 07/31/24 2009  HGB 13.9  --   --   --  11.4*  --   --   HCT 43.1  --   --   --  34.8*  --   --   PLT 161  --   --   --  139*  --   --   APTT  --   --   --   --   --   --  109*  LABPROT 18.6*  --   --   --   --   --   --   INR 1.5*  --   --   --   --   --   --   CREATININE 1.15  --   --   --  1.14  --   --   TROPONINIHS 35*   < > 64* 69*  --  78*  --    < > = values in this interval not displayed.    Estimated Creatinine Clearance: 41.2 mL/min (by C-G formula based on SCr of 1.14 mg/dL).   Medical History: Past Medical History:  Diagnosis Date   Actinic keratosis    Arthritis, degenerative 10/21/2015   Asthma without status asthmaticus 12/13/2014   Atrial fibrillation (HCC)    Benign fibroma of prostate 12/13/2014   Benign prostatic hyperplasia with urinary obstruction 10/30/2012   Blood glucose elevated 11/28/2014   BP (high blood pressure) 12/13/2014   Chronic obstructive pulmonary disease (HCC) 12/13/2014   Chronic systolic heart failure (HCC) 07/09/2014   Overview:  Global ef 45%    Combined fat and carbohydrate induced hyperlipemia 04/01/2014   Edema leg 04/01/2014   Excessive urination at night 10/30/2012   Gout 12/13/2014   Hypertension    Impaired renal function 12/13/2014   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, implant, and graft 05/13/2007   Overview:  Infection Due To An  Internal Joint Prosthesis    Methicillin susceptible Staphylococcus aureus infection 02/25/2010   Overview:  Methicillin Susceptible Staphylococcus Aureus Infection    MI (mitral incompetence) 07/09/2014   Obstruction to urinary outflow 12/13/2014   Paroxysmal atrial fibrillation (HCC) 07/09/2014    Medications:  Scheduled:   amLODipine   5 mg Oral BID   brimonidine   1 drop Left Eye BID   And   timolol   1 drop Left Eye BID   [START ON 08/01/2024] calcium  carbonate  1,500 mg Oral Once per day on Monday Wednesday Friday   Chlorhexidine Gluconate Cloth  6 each Topical Daily   fesoterodine   4 mg Oral Daily   latanoprost   1 drop Both Eyes QHS   metoprolol tartrate  12.5 mg Oral BID   sodium chloride  flush  3 mL Intravenous Q12H   tamsulosin   0.4 mg Oral Daily   Infusions:   heparin  1,250 Units/hr (07/31/24 1800)   nitroGLYCERIN Stopped (07/31/24 1145)   piperacillin-tazobactam Stopped (07/31/24 1734)  PRN: bisacodyl, ibuprofen, ipratropium-albuterol , morphine injection, ondansetron  **OR** ondansetron  (ZOFRAN ) IV, mouth rinse, oxyCODONE , senna-docusate, traZODone  Assessment: 89 y.o. male [name] with a known history of asthma A-fib BPH hypertension COPD chronic systolic heart failure with an EF 45%, hyperlipidemia, hypertension, CKD presents to the emergency department for evaluation of chest pain. Last apixaban dose 07/31/24 0014  Goal of Therapy:  anti-Xa level 0.3-0.7 units/ml aPTT 66 - 102 seconds Monitor platelets by anticoagulation protocol: Yes  11/4 2009: aPTT 109, SUPRAtherapeutic   Plan:  --Reduce heparin  gtt rate to 1150 units/hr --Recheck aPTT level in 8 hours (we will use aPTT to guide therapy until aPTT and anti-Xa level correlate)  --Continue to monitor H&H and platelets   Ransom Blanch PGY-1 Pharmacy Resident  [place] - [facility]  07/31/2024 9:23 PM

## 2024-08-01 ENCOUNTER — Inpatient Hospital Stay
Admit: 2024-08-01 | Discharge: 2024-08-01 | Disposition: A | Discharge status: Home / Self Care | Attending: Family Medicine | Admitting: Family Medicine

## 2024-08-01 ENCOUNTER — Inpatient Hospital Stay: Admitting: Radiology

## 2024-08-01 ENCOUNTER — Inpatient Hospital Stay

## 2024-08-01 DIAGNOSIS — I480 Paroxysmal atrial fibrillation: Secondary | ICD-10-CM | POA: Diagnosis not present

## 2024-08-01 DIAGNOSIS — I161 Hypertensive emergency: Secondary | ICD-10-CM | POA: Diagnosis not present

## 2024-08-01 LAB — ECHOCARDIOGRAM COMPLETE
AR max vel: 1.99 cm2
AV Area VTI: 1.93 cm2
AV Area mean vel: 1.78 cm2
AV Mean grad: 4 mmHg
AV Peak grad: 6.7 mmHg
Ao pk vel: 1.29 m/s
Area-P 1/2: 3.99 cm2
Calc EF: 52.3 %
Height: 66 in
MV VTI: 1.41 cm2
P 1/2 time: 525 ms
S' Lateral: 2.8 cm
Single Plane A2C EF: 48.8 %
Single Plane A4C EF: 52.2 %
Weight: 2832 [oz_av]

## 2024-08-01 LAB — COMPREHENSIVE METABOLIC PANEL WITH GFR
ALT: 143 U/L — ABNORMAL HIGH (ref 0–44)
AST: 134 U/L — ABNORMAL HIGH (ref 15–41)
Albumin: 2.3 g/dL — ABNORMAL LOW (ref 3.5–5.0)
Alkaline Phosphatase: 174 U/L — ABNORMAL HIGH (ref 38–126)
Anion gap: 10 (ref 5–15)
BUN: 36 mg/dL — ABNORMAL HIGH (ref 8–23)
CO2: 26 mmol/L (ref 22–32)
Calcium: 8.3 mg/dL — ABNORMAL LOW (ref 8.9–10.3)
Chloride: 102 mmol/L (ref 98–111)
Creatinine, Ser: 1 mg/dL (ref 0.61–1.24)
GFR, Estimated: >60 mL/min (ref >60)
Glucose, Bld: 88 mg/dL (ref 70–99)
Potassium: 4.7 mmol/L (ref 3.5–5.1)
Sodium: 138 mmol/L (ref 135–145)
Total Bilirubin: 7.7 mg/dL — ABNORMAL HIGH (ref 0.0–1.2)
Total Protein: 6.8 g/dL (ref 6.5–8.1)

## 2024-08-01 LAB — APTT
aPTT: 82 s — ABNORMAL HIGH (ref 24–36)
aPTT: 42 s — ABNORMAL HIGH (ref 24–36)

## 2024-08-01 LAB — CBC
HCT: 35.8 % — ABNORMAL LOW (ref 39.0–52.0)
Hemoglobin: 11.5 g/dL — ABNORMAL LOW (ref 13.0–17.0)
MCH: 31.8 pg (ref 26.0–34.0)
MCHC: 32.1 g/dL (ref 30.0–36.0)
MCV: 98.9 fL (ref 80.0–100.0)
Platelets: 130 K/uL — ABNORMAL LOW (ref 150–400)
RBC: 3.62 MIL/uL — ABNORMAL LOW (ref 4.22–5.81)
RDW: 14 % (ref 11.5–15.5)
WBC: 10.5 K/uL (ref 4.0–10.5)
nRBC: 0 % (ref 0.0–0.2)

## 2024-08-01 LAB — HEPARIN LEVEL (UNFRACTIONATED): Heparin Unfractionated: >1.1 [IU]/mL — ABNORMAL HIGH (ref 0.30–0.70)

## 2024-08-01 LAB — LACTIC ACID, PLASMA
Lactic Acid, Venous: 2.5 mmol/L (ref 0.5–1.9)
Lactic Acid, Venous: 1.7 mmol/L (ref 0.5–1.9)

## 2024-08-01 MED ORDER — BOOST / RESOURCE BREEZE PO LIQD CUSTOM
1.0000 | Freq: Three times a day (TID) | ORAL | Status: DC
  Administered 2024-08-01: 1 via ORAL

## 2024-08-01 MED ORDER — TAMSULOSIN HCL 0.4 MG PO CAPS: 0.8000 mg | ORAL_CAPSULE | Freq: Every day | ORAL | Status: DC

## 2024-08-01 MED ORDER — HEPARIN BOLUS VIA INFUSION
2400.0000 [IU] | Freq: Once | INTRAVENOUS | Status: AC
  Administered 2024-08-01: 2400 [IU] via INTRAVENOUS
  Filled 2024-08-01: qty 2400

## 2024-08-01 MED ORDER — PERFLUTREN LIPID MICROSPHERE
1.0000 mL | INTRAVENOUS | Status: AC | PRN
  Administered 2024-08-01: 3 mL via INTRAVENOUS

## 2024-08-01 MED ORDER — AMLODIPINE BESYLATE 10 MG PO TABS
10.0000 mg | ORAL_TABLET | Freq: Every day | ORAL | Status: DC
  Administered 2024-08-01: 10 mg via ORAL
  Filled 2024-08-01: qty 1

## 2024-08-01 MED ORDER — FUROSEMIDE 10 MG/ML IJ SOLN
40.0000 mg | Freq: Once | INTRAMUSCULAR | Status: AC
  Administered 2024-08-01: 40 mg via INTRAVENOUS
  Filled 2024-08-01: qty 4

## 2024-08-01 MED ORDER — PANTOPRAZOLE SODIUM 40 MG IV SOLR
40.0000 mg | Freq: Every day | INTRAVENOUS | Status: DC
  Administered 2024-08-01 – 2024-08-02 (×2): 40 mg via INTRAVENOUS
  Filled 2024-08-01 (×2): qty 10

## 2024-08-01 MED ORDER — SODIUM CHLORIDE 0.9 % IV BOLUS
500.0000 mL | Freq: Once | INTRAVENOUS | Status: AC
  Administered 2024-08-01: 500 mL via INTRAVENOUS

## 2024-08-01 NOTE — Progress Notes (Signed)
 Patients blood pressure was soft most of the day. He did have some that were 86/55 map 64, 89/45 map 53, 78/50 map 60. IR was not able to do the procedure today d/t the patients blood pressure. Made MD and CN aware of low blood pressures.

## 2024-08-01 NOTE — Care Management Important Message (Signed)
 Important Message  Patient Details  Name: TYLIN FORCE MRN: [id number] Date of Birth: [birth year]   Important Message Given:  Yes - Medicare IM     Rojelio SHAUNNA Rattler 08/01/2024, 1:22 PM

## 2024-08-01 NOTE — Progress Notes (Signed)
 IR Procedure Request - Cholecystostomy Tube Placement  89 y.o male inpatient. History of COPD, a fib, CKD, CHF, HTN, sick sinus syndrome s/p pacemaker.  Presented to the ED at [facility] on 11.3.25 with chest pain. Found to be hypoxic with acute pancreatitis and elevated troponin. MRCP from 11.3.23 reads Acute pancreatitis with diffuse pancreatic edema. No pancreatic necrosis or pseudocyst. 2. Large gallbladder neck stone measuring 2.7 cm with layering sludge. Equivocal gallbladder wall thickening. 3. Mild central biliary dilatation with common bile duct measuring up to 9 mm, likely filled with sludge.  No choledocholithiasis identified; evaluation limited by motion artifact. CT CAP from 11.3.24 reads Cholelithiasis   WBC 12.3, alkaline phosphatase 180 , albumin 2.5, AST, 236, ALT 179,  total bilirubin 5.9. On eliquis Last dose given on 11.4.25. @ 0014. Currently on a heparin  gtt. No pertinent allergies.  After review of procedure request by IR Attending Dr. Wilkie Lent Patient is not a candidate for percutaneous access. Unfortunately his anatomy does not allow for a safe  percutaneous window. Recommend continue to medically manage.   This was communicated directly to the Team via EPIC chat

## 2024-08-01 NOTE — Progress Notes (Signed)
 PHARMACY - ANTICOAGULATION CONSULT NOTE  Pharmacy Consult for heparin  infusion Indication: atrial fibrillation  Allergies  Allergen Reactions   Celecoxib Rash and Other (See Comments)    Hallucination    Patient Measurements: Height: 5' 6 (167.6 cm) Weight: 80.3 kg (177 lb) IBW/kg (Calculated) : 63.8 HEPARIN  DW (KG): 79.8  Vital Signs: Temp: 99.6 F (37.6 C) (11/05 1200) Temp Source: Oral (11/05 1200) BP: 102/58 (11/05 1419) Pulse Rate: 91 (11/05 1420)  Labs: Recent Labs    07/30/24 1230 07/30/24 1451 07/30/24 1942 07/30/24 2245 07/31/24 0130 07/31/24 0645 07/31/24 2009 08/01/24 0604 08/01/24 1407  HGB 13.9  --   --   --  11.4*  --   --  11.5*  --   HCT 43.1  --   --   --  34.8*  --   --  35.8*  --   PLT 161  --   --   --  139*  --   --  130*  --   APTT  --   --   --   --   --   --  109* 82* 42*  LABPROT 18.6*  --   --   --   --   --   --   --   --   INR 1.5*  --   --   --   --   --   --   --   --   HEPARINUNFRC  --   --   --   --   --   --   --  >1.10*  --   CREATININE 1.15  --   --   --  1.14  --   --  1.00  --   TROPONINIHS 35*   < > 64* 69*  --  78*  --   --   --    < > = values in this interval not displayed.    Estimated Creatinine Clearance: 46.9 mL/min (by C-G formula based on SCr of 1 mg/dL).   Medical History: Past Medical History:  Diagnosis Date   Actinic keratosis    Arthritis, degenerative 10/21/2015   Asthma without status asthmaticus 12/13/2014   Atrial fibrillation (HCC)    Benign fibroma of prostate 12/13/2014   Benign prostatic hyperplasia with urinary obstruction 10/30/2012   Blood glucose elevated 11/28/2014   BP (high blood pressure) 12/13/2014   Chronic obstructive pulmonary disease (HCC) 12/13/2014   Chronic systolic heart failure (HCC) 07/09/2014   Overview:  Global ef 45%    Combined fat and carbohydrate induced hyperlipemia 04/01/2014   Edema leg 04/01/2014   Excessive urination at night 10/30/2012   Gout 12/13/2014   Hypertension     Impaired renal function 12/13/2014   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, implant, and graft 05/13/2007   Overview:  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    Methicillin susceptible Staphylococcus aureus infection 02/25/2010   Overview:  Methicillin Susceptible Staphylococcus Aureus Infection    MI (mitral incompetence) 07/09/2014   Obstruction to urinary outflow 12/13/2014   Paroxysmal atrial fibrillation (HCC) 07/09/2014    Medications:  Scheduled:   brimonidine   1 drop Left Eye BID   And   timolol   1 drop Left Eye BID   Chlorhexidine Gluconate Cloth  6 each Topical Daily   fesoterodine   4 mg Oral Daily   latanoprost   1 drop Both Eyes QHS   metoprolol tartrate  12.5 mg Oral BID  pantoprazole  (PROTONIX ) IV  40 mg Intravenous Daily   sodium chloride  flush  3 mL Intravenous Q12H   tamsulosin   0.4 mg Oral Daily   Infusions:   heparin  Stopped (08/01/24 1203)   piperacillin-tazobactam 3.375 g (08/01/24 1306)   PRN: bisacodyl, ibuprofen, ipratropium-albuterol , morphine injection, ondansetron  **OR** ondansetron  (ZOFRAN ) IV, mouth rinse, oxyCODONE , traZODone  Assessment: 89 y.o. male [name] with a known history of asthma A-fib BPH hypertension COPD chronic systolic heart failure with an EF 45%, hyperlipidemia, hypertension, CKD presents to the emergency department for evaluation of chest pain. Last apixaban dose 07/31/24 0014.  Goal of Therapy:  anti-Xa level 0.3-0.7 units/ml aPTT 66 - 102 seconds Monitor platelets by anticoagulation protocol: Yes  11/4 2009: aPTT 109, SUPRAtherapeutic 11/5 0604: aPTT  82, therapeutic x1 11/5 1407: aPTT  42, SUBtherapeutic @ 1150 units/hr   Plan:  CBC stable Ordered 2400 unit bolus, and increased drip rate 1400 units/hr Recheck aPTT level in 8 hours (we will use aPTT to guide therapy until aPTT and anti-Xa level correlate)  Continue to monitor H&H and platelets  Leonor Argyle PGY-1  Pharmacy Resident  [place] - [facility]  08/01/2024 3:09 PM

## 2024-08-01 NOTE — Progress Notes (Signed)
 [facility] CLINIC CARDIOLOGY PROGRESS NOTE       Patient ID: David Frey MRN: [id number] DOB/AGE: 1930/01/06 89 y.o.  Admit date: 07/30/2024 Referring Physician Dr. Thersia Roger  Primary Physician Lenon Layman ORN, MD  Primary Cardiologist Dr. Wilburn Reason for Consultation Elevated troponin, chest pain  HPI: David Frey is a 89 y.o. male  with a past medical history of sick sinus syndrome s/p Micra pacer implant 2020, chronic atrial fibrillation, hypertension, moderate mitral insufficiency, hyperlipidemia who presented to the ED on 07/30/2024 for chest pain. Trops mildly elevated. Lipase found to be significantly elevated and MRCP demonstrated acute pancreatitis. Cardiology was consulted for further evaluation.   Interval history: -Patient seen and examined this AM, resting in bed.  -Slightly confused this AM. No complaints of CP or SOB.  -BP up this AM. HR stable, pacing.   Review of systems complete and found to be negative unless listed above    Past Medical History:  Diagnosis Date   Actinic keratosis    Arthritis, degenerative 10/21/2015   Asthma without status asthmaticus 12/13/2014   Atrial fibrillation (HCC)    Benign fibroma of prostate 12/13/2014   Benign prostatic hyperplasia with urinary obstruction 10/30/2012   Blood glucose elevated 11/28/2014   BP (high blood pressure) 12/13/2014   Chronic obstructive pulmonary disease (HCC) 12/13/2014   Chronic systolic heart failure (HCC) 07/09/2014   Overview:  Global ef 45%    Combined fat and carbohydrate induced hyperlipemia 04/01/2014   Edema leg 04/01/2014   Excessive urination at night 10/30/2012   Gout 12/13/2014   Hypertension    Impaired renal function 12/13/2014   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, implant, and graft 05/13/2007   Overview:  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    Methicillin susceptible Staphylococcus aureus infection 02/25/2010   Overview:  Methicillin  Susceptible Staphylococcus Aureus Infection    MI (mitral incompetence) 07/09/2014   Obstruction to urinary outflow 12/13/2014   Paroxysmal atrial fibrillation (HCC) 07/09/2014    Past Surgical History:  Procedure Laterality Date   APPENDECTOMY  09/27/1941   COLONOSCOPY WITH PROPOFOL  N/A 03/15/2020   Procedure: COLONOSCOPY WITH PROPOFOL ;  Surgeon: Toledo, Ladell POUR, MD;  Location: [facility] ENDOSCOPY;  Service: Gastroenterology;  Laterality: N/A;   ESOPHAGOGASTRODUODENOSCOPY (EGD) WITH PROPOFOL  N/A 03/15/2020   Procedure: ESOPHAGOGASTRODUODENOSCOPY (EGD) WITH PROPOFOL ;  Surgeon: Toledo, Ladell POUR, MD;  Location: [facility] ENDOSCOPY;  Service: Gastroenterology;  Laterality: N/A;   HERNIA REPAIR  09/28/1991   PACEMAKER LEADLESS INSERTION N/A 12/18/2018   Procedure: PACEMAKER LEADLESS INSERTION;  Surgeon: Ammon Blunt, MD;  Location: [facility] INVASIVE CV LAB;  Service: Cardiovascular;  Laterality: N/A;   TONSILLECTOMY  09/27/1964   TOTAL KNEE ARTHROPLASTY  09/27/2005   TRANSURETHRAL RESECTION OF PROSTATE  09/28/1983    Medications Prior to Admission  Medication Sig Dispense Refill Last Dose/Taking   amLODipine  (NORVASC ) 2.5 MG tablet Take 2.5 mg by mouth daily.   07/30/2024 at  8:03 AM   amoxicillin-clavulanate (AUGMENTIN) 875-125 MG tablet Take 1 tablet by mouth 2 (two) times daily. 14 tablet 0 07/30/2024 at  8:03 AM   Apoaequorin (PREVAGEN PO) Take 1 capsule by mouth daily.   07/30/2024 at  8:03 AM   calcium  carbonate (OSCAL) 1500 (600 Ca) MG TABS tablet Take 1 tablet by mouth. Takes 3 times a week (Monday, Wednesday, Saturday)   07/30/2024 at  8:03 AM   COMBIGAN  0.2-0.5 % ophthalmic solution Place 1 drop into the left eye 2 (two) times  daily.   07/30/2024 at  8:50 AM   docusate sodium  (COLACE) 100 MG capsule Take 100 mg by mouth 3 (three) times a week. Tuesday, Thursday, Saturday   Unknown   ELIQUIS 5 MG TABS tablet Take 5 mg by mouth 2 (two) times daily.   07/30/2024 at  8:03 AM   fesoterodine   (TOVIAZ ) 4 MG TB24 tablet TAKE ONE TABLET BY MOUTH ONE TIME DAILY 30 tablet 11 07/30/2024 at  8:03 AM   latanoprost  (XALATAN ) 0.005 % ophthalmic solution Place 1 drop into both eyes at bedtime.    07/29/2024 at  8:12 PM   tamsulosin  (FLOMAX ) 0.4 MG CAPS capsule TAKE 1 CAPSULE BY MOUTH EVERY DAY 90 capsule 3 07/30/2024 at  8:04 AM   Social History   Socioeconomic History   Marital status: Married    Spouse name: Not on file   Number of children: Not on file   Years of education: Not on file   Highest education level: Not on file  Occupational History   Not on file  Tobacco Use   Smoking status: Never   Smokeless tobacco: Never  Vaping Use   Vaping status: Never Used  Substance and Sexual Activity   Alcohol use: No    Alcohol/week: 0.0 standard drinks of alcohol   Drug use: No   Sexual activity: Yes  Other Topics Concern   Not on file  Social History Narrative   Not on file   Social Drivers of Health   Financial Resource Strain: Low Risk  (02/07/2024)   Received from [facility]   Overall Financial Resource Strain (CARDIA)    Difficulty of Paying Living Expenses: Not hard at all  Food Insecurity: No Food Insecurity (07/30/2024)   Hunger Vital Sign    Worried About Running Out of Food in the Last Year: Never true    Ran Out of Food in the Last Year: Never true  Transportation Needs: No Transportation Needs (07/30/2024)   PRAPARE - Administrator, Civil Service (Medical): No    Lack of Transportation (Non-Medical): No  Physical Activity: Not on file  Stress: Not on file  Social Connections: Socially Isolated (07/30/2024)   Social Connection and Isolation Panel    Frequency of Communication with Friends and Family: More than three times a week    Frequency of Social Gatherings with Friends and Family: More than three times a week    Attends Religious Services: Never    Database Administrator or Organizations: No    Attends Banker  Meetings: Never    Marital Status: Widowed  Intimate Partner Violence: Not At Risk (07/30/2024)   Humiliation, Afraid, Rape, and Kick questionnaire    Fear of Current or Ex-Partner: No    Emotionally Abused: No    Physically Abused: No    Sexually Abused: No    Family History  Problem Relation Age of Onset   Bladder Cancer Neg Hx    Prostate cancer Neg Hx    Kidney cancer Neg Hx      Vitals:   08/01/24 0710 08/01/24 0720 08/01/24 0730 08/01/24 0740  BP:   (!) 142/85   Pulse: 84 82 79 81  Resp: (!) 23 (!) 32 (!) 25 (!) 31  Temp:      TempSrc:      SpO2: 97% 97% 95% 94%  Weight:      Height:        PHYSICAL EXAM General: Chronically ill-appearing  elderly male, well nourished, in no acute distress. HEENT: Normocephalic and atraumatic. Neck: No JVD.  Lungs: Normal respiratory effort on 4L [place]. Clear bilaterally to auscultation. No wheezes, crackles, rhonchi.  Heart: Irregularly irregular, controlled rate. Normal S1 and S2 without gallops or murmurs.  Abdomen: Non-distended appearing.  Msk: Normal strength and tone for age. Extremities: Warm and well perfused. No clubbing, cyanosis.  No edema.  Neuro: Alert and oriented X 3. Psych: Answers questions appropriately.   Labs: Basic Metabolic Panel: Recent Labs    07/31/24 0130 08/01/24 0604  NA 138 138  K 4.2 4.7  CL 100 102  CO2 26 26  GLUCOSE 125* 88  BUN 29* 36*  CREATININE 1.14 1.00  CALCIUM  8.6* 8.3*  MG 1.7  --    Liver Function Tests: Recent Labs    07/31/24 0130 08/01/24 0604  AST 236* 134*  ALT 179* 143*  ALKPHOS 180* 174*  BILITOT 5.9* 7.7*  PROT 6.9 6.8  ALBUMIN 2.5* 2.3*   Recent Labs    07/30/24 1230  LIPASE >2,800*   CBC: Recent Labs    07/31/24 0130 08/01/24 0604  WBC 12.3* 10.5  HGB 11.4* 11.5*  HCT 34.8* 35.8*  MCV 97.5 98.9  PLT 139* 130*   Cardiac Enzymes: Recent Labs    07/30/24 1942 07/30/24 2245 07/31/24 0645  TROPONINIHS 64* 69* 78*   BNP: Recent Labs     07/30/24 1230  BNP 404.2*   D-Dimer: No results for input(s): DDIMER in the last 72 hours. Hemoglobin A1C: No results for input(s): HGBA1C in the last 72 hours. Fasting Lipid Panel: Recent Labs    07/30/24 1942  CHOL 91  HDL 36*  LDLCALC 47  TRIG 42  CHOLHDL 2.5   Thyroid Function Tests: Recent Labs    07/30/24 1942  TSH 1.103   Anemia Panel: No results for input(s): VITAMINB12, FOLATE, FERRITIN, TIBC, IRON, RETICCTPCT in the last 72 hours.   Radiology: MR ABDOMEN MRCP W WO CONTAST Result Date: 07/31/2024 EXAM: MRCP WITH AND WITHOUT IV CONTRAST 07/30/2024 10:57:39 PM TECHNIQUE: Multisequence, multiplanar magnetic resonance images of the abdomen with and without intravenous contrast. MRCP sequences were performed. 8 mL (gadobutrol (GADAVIST) 1 MMOL/ML injection 8 mL GADOBUTROL 1 MMOL/ML IV SOLN). COMPARISON: None available. CLINICAL HISTORY: Pancreatitis, hepatobiliary obstruction, c/f choledocholithiasis. FINDINGS: LIVER: There is relative hypertrophy of the caudate lobe and lateral segment of left lobe of liver. No focal liver lesion identified. GALLBLADDER AND BILIARY SYSTEM: Large stone identified within the gallbladder neck measures 2.7 cm, image 15/4. Layering gallbladder sludge is also identified. Equivocal gallbladder wall thickening measuring up to 3 mm, image 29/26. The MRCP images are significantly motion degraded. Intermediate T2 signal is noted in the common bile duct. The common bile duct measures up to 9 mm and there is intermediate T1 and T2 signal throughout the course of the common bile duct which is likely filled with gallbladder sludge. Mild central biliary dilatation identified. No signs of choledocholithiasis. SPLEEN: Normal appearance of the spleen. PANCREAS/PANCREATIC DUCT: The main pancreatic duct is upper limits of normal measuring 3 mm. Diffuse edema throughout the course of the pancreas is identified concerning for acute pancreatitis. No signs of  pancreatic necrosis or pseudocyst. ADRENAL GLANDS: Normal size and morphology bilaterally. No nodule, thickening, or hemorrhage. No periadrenal stranding. KIDNEYS: Exophytic cyst of the upper pole of right kidney measures 1.9 cm; this is uniformly T2 hyperintense without internal enhancement. No follow-up imaging recommended. Left renal cortical scarring. LYMPH NODES: No adenopathy  identified. VASCULATURE: The upper abdomen vascularity appears patent. Aortic atherosclerotic calcifications. PERITONEUM: Small volume of right upper quadrant, perihepatic, and pericholecystic ascites. ABDOMINAL WALL: Fat containing, midline ventral abdominal wall hernia measures 3.2 cm. BOWEL: Grossly unremarkable. No bowel obstruction. BONES: Thoracolumbar scoliosis deformity with convexity towards the left. Lumbar degenerative disc disease. No acute abnormality or worrisome osseous lesion. SOFT TISSUES: Unremarkable. MISCELLANEOUS: Cardiac enlargement. IMPRESSION: 1. Acute pancreatitis with diffuse pancreatic edema. No pancreatic necrosis or pseudocyst. 2. Large gallbladder neck stone measuring 2.7 cm with layering sludge. Equivocal gallbladder wall thickening. 3. Mild central biliary dilatation with common bile duct measuring up to 9 mm, likely filled with sludge. No choledocholithiasis identified; evaluation limited by motion artifact. 4. Small volume perihepatic and pericholecystic ascites. Electronically signed by: Waddell Calk MD 07/31/2024 05:23 AM EST RP Workstation: [id number]   MR 3D Recon At Scanner Result Date: 07/31/2024 EXAM: MRCP WITH AND WITHOUT IV CONTRAST 07/30/2024 10:57:39 PM TECHNIQUE: Multisequence, multiplanar magnetic resonance images of the abdomen with and without intravenous contrast. MRCP sequences were performed. 8 mL (gadobutrol (GADAVIST) 1 MMOL/ML injection 8 mL GADOBUTROL 1 MMOL/ML IV SOLN). COMPARISON: None available. CLINICAL HISTORY: Pancreatitis, hepatobiliary obstruction, c/f  choledocholithiasis. FINDINGS: LIVER: There is relative hypertrophy of the caudate lobe and lateral segment of left lobe of liver. No focal liver lesion identified. GALLBLADDER AND BILIARY SYSTEM: Large stone identified within the gallbladder neck measures 2.7 cm, image 15/4. Layering gallbladder sludge is also identified. Equivocal gallbladder wall thickening measuring up to 3 mm, image 29/26. The MRCP images are significantly motion degraded. Intermediate T2 signal is noted in the common bile duct. The common bile duct measures up to 9 mm and there is intermediate T1 and T2 signal throughout the course of the common bile duct which is likely filled with gallbladder sludge. Mild central biliary dilatation identified. No signs of choledocholithiasis. SPLEEN: Normal appearance of the spleen. PANCREAS/PANCREATIC DUCT: The main pancreatic duct is upper limits of normal measuring 3 mm. Diffuse edema throughout the course of the pancreas is identified concerning for acute pancreatitis. No signs of pancreatic necrosis or pseudocyst. ADRENAL GLANDS: Normal size and morphology bilaterally. No nodule, thickening, or hemorrhage. No periadrenal stranding. KIDNEYS: Exophytic cyst of the upper pole of right kidney measures 1.9 cm; this is uniformly T2 hyperintense without internal enhancement. No follow-up imaging recommended. Left renal cortical scarring. LYMPH NODES: No adenopathy identified. VASCULATURE: The upper abdomen vascularity appears patent. Aortic atherosclerotic calcifications. PERITONEUM: Small volume of right upper quadrant, perihepatic, and pericholecystic ascites. ABDOMINAL WALL: Fat containing, midline ventral abdominal wall hernia measures 3.2 cm. BOWEL: Grossly unremarkable. No bowel obstruction. BONES: Thoracolumbar scoliosis deformity with convexity towards the left. Lumbar degenerative disc disease. No acute abnormality or worrisome osseous lesion. SOFT TISSUES: Unremarkable. MISCELLANEOUS: Cardiac  enlargement. IMPRESSION: 1. Acute pancreatitis with diffuse pancreatic edema. No pancreatic necrosis or pseudocyst. 2. Large gallbladder neck stone measuring 2.7 cm with layering sludge. Equivocal gallbladder wall thickening. 3. Mild central biliary dilatation with common bile duct measuring up to 9 mm, likely filled with sludge. No choledocholithiasis identified; evaluation limited by motion artifact. 4. Small volume perihepatic and pericholecystic ascites. Electronically signed by: Waddell Calk MD 07/31/2024 05:23 AM EST RP Workstation: [id number]   CT Angio Chest/Abd/Pel for Dissection W and/or Wo Contrast Result Date: 07/30/2024 CLINICAL DATA:  Chest pain, shortness of breath. EXAM: CT ANGIOGRAPHY CHEST, ABDOMEN AND PELVIS TECHNIQUE: Non-contrast CT of the chest was initially obtained. Multidetector CT imaging through the chest, abdomen and pelvis was performed using the standard protocol  during bolus administration of intravenous contrast. Multiplanar reconstructed images and MIPs were obtained and reviewed to evaluate the vascular anatomy. RADIATION DOSE REDUCTION: This exam was performed according to the departmental dose-optimization program which includes automated exposure control, adjustment of the mA and/or kV according to patient size and/or use of iterative reconstruction technique. CONTRAST:  100mL OMNIPAQUE IOHEXOL 350 MG/ML SOLN COMPARISON:  None Available. FINDINGS: CTA CHEST FINDINGS Cardiovascular: No evidence of an aortic intramural hematoma on precontrast imaging or aortic dissection or aneurysm on postcontrast imaging. Atherosclerotic calcification of the aorta, aortic valve and coronary arteries. Heart is enlarged. No pericardial effusion. Enlarged pulmonic trunk. Mediastinum/Nodes: No pathologically enlarged mediastinal, hilar or axillary lymph nodes. Air in the esophagus can be seen with dysmotility. Lungs/Pleura: Mild dependent atelectasis bilaterally. Pulmonary nodules measure 6 mm or  less in size. No specific follow-up recommended in a patient of this age. No pleural fluid. Airway is unremarkable. Musculoskeletal: Degenerative changes in the spine. Review of the MIP images confirms the above findings. CTA ABDOMEN AND PELVIS FINDINGS VASCULAR Aorta: No aneurysm or dissection.  Atherosclerotic calcification. Celiac: Ostial calcification without significant luminal narrowing. Widely patent. SMA: Ostial calcification.  Widely patent. Renals: Ostial calcification bilaterally.  Widely patent. IMA: Widely patent. Inflow: Minimally ectatic but not aneurysmal. Atherosclerotic calcification. No dissection. Veins: Poorly evaluated due to lack of opacification. Review of the MIP images confirms the above findings. NON-VASCULAR Hepatobiliary: Liver is unremarkable. Gallstones. No biliary ductal dilatation. Pancreas: Negative. Spleen: Negative. Adrenals/Urinary Tract: Adrenal glands are unremarkable. Low-attenuation lesions in the kidneys. No specific follow-up necessary. Renal cortical scarring and thinning bilaterally. Ureters are decompressed. Small right lateral bladder diverticulum. Stomach/Bowel: Small hiatal hernia. Stomach is otherwise unremarkable. Duodenal diverticula are incidentally noted. Small bowel and colon are otherwise unremarkable. Appendix is reportedly absent. Lymphatic: No pathologically enlarged lymph nodes. Reproductive: Prostate is visualized. Other: Small bilateral inguinal hernias contain fat. Small umbilical hernia contains fat. No free fluid. Musculoskeletal: Degenerative changes in the spine. Osteopenia. Mild levoconvex scoliosis. Review of the MIP images confirms the above findings. IMPRESSION: 1. No evidence of acute aortic syndrome or aneurysm. 2. Cholelithiasis. 3. Aortic atherosclerosis (ICD10-I70.0). Coronary artery calcification. 4. Enlarged pulmonic trunk, indicative of pulmonary arterial hypertension. Electronically Signed   By: Newell Eke M.D.   On: 07/30/2024  14:32   DG Chest Portable 1 View Result Date: 07/30/2024 CLINICAL DATA:  Substernal chest pain and shortness of breath. EXAM: PORTABLE CHEST 1 VIEW COMPARISON:  09/12/2009. FINDINGS: Trachea is midline. Heart is enlarged. Lead less pacemaker projects over the left heart. Lungs are low in volume but clear. No pleural fluid. IMPRESSION: No acute findings. Electronically Signed   By: Newell Eke M.D.   On: 07/30/2024 14:22   CT HEAD WO CONTRAST ( ) Result Date: 07/27/2024 EXAM: CT HEAD WITHOUT CONTRAST 07/27/2024 09:18:04 AM TECHNIQUE: CT of the head was performed without the administration of intravenous contrast. Automated exposure control, iterative reconstruction, and/or weight based adjustment of the mA/kV was utilized to reduce the radiation dose to as low as reasonably achievable. COMPARISON: None available. CLINICAL HISTORY: Headache, new onset (Age >= 51y). FINDINGS: BRAIN AND VENTRICLES: No acute hemorrhage. No evidence of acute infarct. No hydrocephalus. No extra-axial collection. No mass effect or midline shift. Age-related cerebral volume loss, not unusual for age. Mild periventricular and deep cerebral white matter disease. ORBITS: No acute abnormality. Status post bilateral lens replacement. SINUSES: Complete opacification of the right maxillary sinus, anterior ethmoid air cells and frontal sinuses. SOFT TISSUES AND SKULL: No acute  soft tissue abnormality. No skull fracture. VASCULATURE: Moderate to severe atheromatous calcification within the carotid siphons and vertebral arteries. IMPRESSION: 1. No acute intracranial abnormality. 2. Complete opacification of the right maxillary sinus, anterior ethmoid air cells, and frontal sinuses. Electronically signed by: Evalene Coho MD 07/27/2024 09:25 AM EDT RP Workstation: [id number]    ECHO ordered  TELEMETRY (personally reviewed): Atrial fibrillation/intermittent ventricular pacing rate 70s  EKG (personally reviewed): ventricular pacing  rate 94 bpm  Data reviewed by me 08/01/2024: last 24h vitals tele labs imaging I/O ED provider note, admission H&P, GI notes  Principal Problem:   Hypertensive emergency Active Problems:   Acute pancreatitis   Hyperbilirubinemia    ASSESSMENT AND PLAN:  David Frey is a 89 y.o. male  with a past medical history of sick sinus syndrome s/p Micra pacer implant 2020, chronic atrial fibrillation, hypertension, moderate mitral insufficiency, hyperlipidemia who presented to the ED on 07/30/2024 for chest pain. Trops mildly elevated. Lipase found to be significantly elevated and MRCP demonstrated acute pancreatitis. Cardiology was consulted for further evaluation.   # Acute pancreatitis # Hypertensive emergency # Demand ischemia # Atypical chest pain # Chronic atrial fibrillation # SSS s/p Micra leadless pacemaker 2020 Patient presented with chest pain, significantly elevated blood pressure.  Found to have significantly elevated lipase suggestive of pancreatitis.  MRCP consistent with this.  Was started on IV nitroglycerin drip for BP, titrated down overnight but increased again due to complaints of chest discomfort.  Troponins are mildly elevated up to 78 with no acute ischemic changes on EKG.  Suspect chest discomfort is secondary to acute pancreatitis. -Echo pending. Further recommendations pending results. -Consolidate amlodipine  to 10 mg daily. NTG gtt weaned off yesterday. -Will continue heparin  for [facility] in preparation for procedure - IR vs ERCP. -Minimally elevated and flat troponin most consistent with demand/supply mismatch and not ACS in the setting of acute pancreatitis. No plan for further cardiac diagnostics at this time. -Pacemaker interrogated 07/31/2024 and device set to VVIR mode, pacing PRN as low rate set at 70.  This patient's plan of care was discussed and created with Dr. Florencio and he is in agreement.  Signed: Danita Bloch, PA-C  08/01/2024, 7:53 AM [facility]  Cardiology

## 2024-08-01 NOTE — Evaluation (Signed)
 Occupational Therapy Evaluation Patient Details Name: David Frey MRN: [id number] DOB: [birth year] Today's Date: 08/01/2024   History of Present Illness   Pt is a 89 y/o M admitted on 07/30/24 after presenting with c/o chest pain. Pt is being treated for acute pancreatitis, severe uncontrolled HTN.  PMH: asthma, a-fib on Eliquis, BPH, HTN, COPD not on home O2, chronic systolic HF, HLD, HTN, CKD, SSS s/p pacemaker placement     Clinical Impressions Patient presenting with decreased Ind in self care,balance, functional mobility, transfers, endurance, and safety awareness. Patient reports living in an ALF and using rollator for mobility with assistance from staff for self care tasks. Patient currently functioning at min A of 2 for mobility with use of RW to management equipment and balance. Pt would likely need max A for LB self care. Pt on 4Ls of O2 this session and normally on RA. Pt fatigues quickly. He stands for brief period of time ~ 30 seconds, takes seated rest break, and then transfers to recliner chair with assistance. Patient will benefit from acute OT to increase overall independence in the areas of ADLs, functional mobility, and safety awareness in order to safely discharge.     If plan is discharge home, recommend the following:   A lot of help with walking and/or transfers;A lot of help with bathing/dressing/bathroom     Functional Status Assessment   Patient has had a recent decline in their functional status and demonstrates the ability to make significant improvements in function in a reasonable and predictable amount of time.     Equipment Recommendations   Other (comment) (defer to next venue of care)      Precautions/Restrictions   Precautions Precautions: Fall     Mobility Bed Mobility Overal bed mobility: Needs Assistance Bed Mobility: Supine to Sit     Supine to sit: Min assist, HOB elevated, Used rails          Transfers Overall transfer  level: Needs assistance Equipment used: Rolling walker (2 wheels) Transfers: Sit to/from Stand, Bed to chair/wheelchair/BSC Sit to Stand: Min assist, +2 physical assistance     Step pivot transfers: Min assist, +2 physical assistance, +2 safety/equipment     General transfer comment: decreased awareness re: safe hand placement during sit>stand      Balance Overall balance assessment: Needs assistance Sitting-balance support: Feet supported Sitting balance-Leahy Scale: Fair     Standing balance support: During functional activity, Bilateral upper extremity supported, Reliant on assistive device for balance Standing balance-Leahy Scale: Poor                             ADL either performed or assessed with clinical judgement   ADL Overall ADL's : Needs assistance/impaired                         Toilet Transfer: Minimal assistance;BSC/3in1;+2 for safety/equipment Toilet Transfer Details (indicate cue type and reason): simulated                 Vision Baseline Vision/History: 1 Wears glasses Patient Visual Report: No change from baseline              Pertinent Vitals/Pain Pain Assessment Pain Assessment: No/denies pain     Extremity/Trunk Assessment Upper Extremity Assessment Upper Extremity Assessment: Generalized weakness   Lower Extremity Assessment Lower Extremity Assessment: Generalized weakness   Cervical / Trunk Assessment Cervical / Trunk Assessment:  (  yellow discoloration)   Communication Communication Communication: No apparent difficulties   Cognition Arousal: Alert Behavior During Therapy: WFL for tasks assessed/performed Cognition: Cognition impaired   Orientation impairments: Place, Time, Situation     Attention impairment (select first level of impairment): Focused attention Executive functioning impairment (select all impairments): Organization, Sequencing, Reasoning, Problem solving                    Following commands: Intact, Impaired Following commands impaired: Follows one step commands with increased time     Cueing  General Comments   Cueing Techniques: Verbal cues  VSS throughout session, void once standing           Home Living Family/patient expects to be discharged to:: Assisted living                             Home Equipment: Rollator (4 wheels)   Additional Comments: [facility]      Prior Functioning/Environment               Mobility Comments: ambulatory with RW ADLs Comments: Pt endorses staff assist with ADLs    OT Problem List: Decreased strength;Decreased safety awareness;Decreased activity tolerance;Impaired balance (sitting and/or standing)   OT Treatment/Interventions: Self-care/ADL training;Therapeutic activities;Therapeutic exercise;Energy conservation;Patient/family education;DME and/or AE instruction;Balance training;Manual therapy      OT Goals(Current goals can be found in the care plan section)   Acute Rehab OT Goals Patient Stated Goal: to get stronger OT Goal Formulation: With patient Time For Goal Achievement: 08/15/24 Potential to Achieve Goals: Fair ADL Goals Pt Will Perform Grooming: standing;with contact guard assist Pt Will Perform Lower Body Dressing: with contact guard assist;sit to/from stand Pt Will Transfer to Toilet: with contact guard assist;ambulating Pt Will Perform Toileting - Clothing Manipulation and hygiene: with contact guard assist;sit to/from stand   OT Frequency:  Min 2X/week    Co-evaluation   Reason for Co-Treatment: Necessary to address cognition/behavior during functional activity;To address functional/ADL transfers PT goals addressed during session: Mobility/safety with mobility;Balance        AM-PAC OT 6 Clicks Daily Activity     Outcome Measure Help from another person eating meals?: None Help from another person taking care of personal grooming?: A Little Help from  another person toileting, which includes using toliet, bedpan, or urinal?: A Lot Help from another person bathing (including washing, rinsing, drying)?: A Lot Help from another person to put on and taking off regular upper body clothing?: A Little Help from another person to put on and taking off regular lower body clothing?: A Lot 6 Click Score: 16   End of Session Nurse Communication: Mobility status;Other (comment)  Activity Tolerance: Patient tolerated treatment well Patient left: with call bell/phone within reach;in chair;with chair alarm set  OT Visit Diagnosis: Unsteadiness on feet (R26.81);Muscle weakness (generalized) (M62.81)                Time: 8799-8781 OT Time Calculation (min): 18 min Charges:  OT General Charges $OT Visit: 1 Visit OT Evaluation $OT Eval Moderate Complexity: 1 [address], [place], OTR/L , CBIS ascom [phone]  08/01/24, 2:01 PM

## 2024-08-01 NOTE — Progress Notes (Signed)
 PROGRESS NOTE [facility]P [facility]    DOB: [birth year], 89 y.o.  FMW:[id number]    Code Status: Limited: Do not attempt resuscitation (DNR) -DNR-LIMITED -Do Not Intubate/DNI    DOA: 07/30/2024   LOS: 2  Brief hospital course  David Frey is a 89 y.o. male with a PMH significant for asthma, A-fib on Eliquis, BPH, hypertension, COPD not on any O2 at home, chronic systolic heart failure with an EF 45%, hyperlipidemia, hypertension, CKD, SSS s/p pacemaker placement, presents to the emergency department for evaluation of chest pain 11/03 from [facility] assisted living via EMS. EMS gave 3 sublingual nitroglycerin and 324 ASA. EMS reports pt was hypertensive at 206/123    11/03: to ED, hypoxic to 88% on room air, BP 194/117, AST 266, ALT 116, ALP 173, TBili 4.0, Lipase >2800, BNO 404, Troponin 35 --> 36. CTA C/A/P (+)cholelithiasis. MRCP (+)pancreatitis, large GB neck stone 2.7 cm w/ layering sludge w/ equivocal GB wall thickening, mild biliary duct dilatation 9 mm. To stepdown on NTG gtt, BP improved, remains on 2-4L O2 [place].  11/04: WBC 12.3, Troponin 78, AST 236, AT 179, TBIli 5.9. Holding Eliquis - last dose 00:14 11/04. GI following - not good ERCP candidate, rec IR to eval for cholecystotomy.  Cardiology following - echo pending, pacer interrogated and ok, continue on heparin  drip, increase amlodipine , wean off ntg as able.  11/05: remains in precarious condition. On new O2 requirement which worsened overnight. Increasing pulmonary edema. Labile BP. Unable to tolerate cholecystotomy. He endorses improved RUQ pain and liver enzymes are improving. He has increased delirium in acute setting  Assessment & Plan  Principal Problem:   Hypertensive emergency Active Problems:   Acute pancreatitis   Hyperbilirubinemia  Acute pancreatitis w/ elevated lipase- as seen on MRCP Possible Choledocholithiasis w/ Stone already passed spontaneously Transaminitis w/ elevated LFTs AST ALT TBIli- improving Now w/  elevated WBC and continued tachypnea = sepsis 07/31/24 GI following, per Dr Jinny - not good ERCP candidate  recs for IR to eval for cholecystotomy.  Not a candidate today due to labile BP continue Zosyn starting 07/31/24 AM Holding Eliquis -  last dose 00:14 11/04. Cardiology clearance requested prior to any procedures. Pending echo.  CMP am   Severe uncontrolled HTN- had increased amlodipine  on admission and started on nitroglycerin gtt which is now weaned off. Currently experiencing hypotension.  - stop amlodipine . - stay in ICU for close BP monitoring  Cardiology following  echo pending Avoid opioids if possible   Chest pain and mildly elevated troponin  likely demand ischemia d/t pancreatitis Have reasonably excluded ACS Chronic Afib Consider d/t intermittently rapid HR, pacemaker put in MRI mode and HR has been labile since then Question NSTEMI as troponin slightly uptrending  Cardiology following --> Minimally elevated and flat troponin most consistent with demand/supply mismatch and not ACS in the setting of acute pancreatitis.   Echo pending Continues on heparin  gtt. Hold home eliquis Pacemaker interrogation - ok trend troponin to peak TSH --> WNL Lipids --> no concerns  Continuous telemetry   Hypoxia, new, unclear etiology possibly related to history of asthma/COPD, demand from hypertension, possible pulm arterial HTN as seen on CT Appears to have worsening pulmonary edema on repeat chest xray - trial of IV lasix x1 O2 and nebs PRN Check respiratory path panel --> no COVID/Flu/RSV Strict I/O    History of CHF Strict intake and output Cardiology following     History of BPH- has been able to void but  some residual urine. - try to urinate sitting or standing for gravity benefit. Post-void residual bladder scan if suspecting retaining - place foley if he has a POST void residual >450cc - increased his home tamsulosin  to 0.8mg  daily starting 11/6 - stopped his home  toviaz - last got this 11/5   Goals of care Advanced care planning  Patient states he would not want to go through risky or complicated surgical procedure, he does not want CPR in the event of cardiac arrest, he does not want intubation in the event of respiratory arrest but is fine for intubation as part of anesthesia is needed for relatively straightforward procedure such as ERCP or cholecystostomy Daughter is present and is in agreement with the above to follow her dad's wishes CODE STATUS updated, DNR/DNI  Body mass index is 28.57 kg/m.  VTE ppx: heparin  bolus via infusion 2,400 Units Start: 08/01/24 1615 SCDs Start: 07/30/24 1726  Diet:     Diet   Diet clear liquid Room service appropriate? Yes; Fluid consistency: Thin   Consultants: Cardiology IR GI  Subjective 08/01/24    Pt reports feeling well. Abdominal pain has improved.    Objective  Blood pressure (!) 102/58, pulse 91, temperature 99.6 F (37.6 C), temperature source Oral, resp. rate (!) 33, height 5' 6 (1.676 m), weight 80.3 kg, SpO2 97%.  Intake/Output Summary (Last 24 hours) at 08/01/2024 1538 Last data filed at 08/01/2024 1224 Gross per 24 hour  Intake 609.73 ml  Output 1400 ml  Net -790.27 ml   Filed Weights   07/30/24 1234 07/30/24 1241  Weight: 80 kg 80.3 kg    Physical Exam:  General: awake, alert, NAD HEENT: atraumatic, clear conjunctiva, anicteric sclera, MMM, hard of hearing Respiratory: normal respiratory effort. CTAB Cardiovascular: extremities well perfused, quick capillary refill, normal S1/S2, RRR, no JVD. Systolic murmur Gastrointestinal: soft, NT, ND. Endorses mild tenderness of R mid abdomen but no grimacing, rebound, involuntary withdrawal with palpation Nervous: A&O x3 upon initial eval in the morning and repeat eval showed that he is disoriented except to self. normal speech Extremities: moves all equally, no edema, normal tone Skin: dry, intact, normal temperature, normal color. No  rashes, lesions or ulcers on exposed skin Psychiatry: normal mood, congruent affect  Labs   I have personally reviewed the following labs and imaging studies CBC    Component Value Date/Time   WBC 10.5 08/01/2024 0604   RBC 3.62 (L) 08/01/2024 0604   HGB 11.5 (L) 08/01/2024 0604   HGB 16.7 12/05/2013 0946   HCT 35.8 (L) 08/01/2024 0604   HCT 48.1 12/05/2013 0946   PLT 130 (L) 08/01/2024 0604   PLT 121 (L) 12/05/2013 0946   MCV 98.9 08/01/2024 0604   MCV 98 12/05/2013 0946   MCH 31.8 08/01/2024 0604   MCHC 32.1 08/01/2024 0604   RDW 14.0 08/01/2024 0604   RDW 13.9 12/05/2013 0946   LYMPHSABS 1.0 03/17/2020 0514   LYMPHSABS 1.1 12/05/2013 0946   MONOABS 0.5 03/17/2020 0514   MONOABS 0.7 12/05/2013 0946   EOSABS 0.1 03/17/2020 0514   EOSABS 0.0 12/05/2013 0946   BASOSABS 0.0 03/17/2020 0514   BASOSABS 0.0 12/05/2013 0946      Latest Ref Rng & Units 08/01/2024    6:04 AM 07/31/2024    1:30 AM 07/30/2024   12:30 PM  BMP  Glucose 70 - 99 mg/dL 88  874  886   BUN 8 - 23 mg/dL 36  29  24   Creatinine 0.61 -  1.24 mg/dL 8.99  8.85  8.84   Sodium 135 - 145 mmol/L 138  138  138   Potassium 3.5 - 5.1 mmol/L 4.7  4.2  3.9   Chloride 98 - 111 mmol/L 102  100  99   CO2 22 - 32 mmol/L 26  26  27    Calcium  8.9 - 10.3 mg/dL 8.3  8.6  9.0     DG Chest Port 1 View Result Date: 08/01/2024 EXAM: 1 VIEW(S) XRAY OF THE CHEST 08/01/2024 09:36:07 AM COMPARISON: 07/30/2024 CLINICAL HISTORY: Hypoxia FINDINGS: LUNGS AND PLEURA: Low lung volumes. Bibasilar platelike opacities. Small pleural effusions. No pulmonary edema. No pneumothorax. HEART AND MEDIASTINUM: Cardiomegaly. Cardiac loop recorder device in place. Atherosclerotic calcifications of the aorta. BONES AND SOFT TISSUES: No acute osseous abnormality. IMPRESSION: 1. Low lung volumes with small pleural effusions and bibasilar platelike atelectasis. 2. Cardiomegaly with cardiac loop recorder device in place. Electronically signed by: Norleen Boxer  MD 08/01/2024 02:14 PM EST RP Workstation: [id number]   MR ABDOMEN MRCP W WO CONTAST Result Date: 07/31/2024 EXAM: MRCP WITH AND WITHOUT IV CONTRAST 07/30/2024 10:57:39 PM TECHNIQUE: Multisequence, multiplanar magnetic resonance images of the abdomen with and without intravenous contrast. MRCP sequences were performed. 8 mL (gadobutrol (GADAVIST) 1 MMOL/ML injection 8 mL GADOBUTROL 1 MMOL/ML IV SOLN). COMPARISON: None available. CLINICAL HISTORY: Pancreatitis, hepatobiliary obstruction, c/f choledocholithiasis. FINDINGS: LIVER: There is relative hypertrophy of the caudate lobe and lateral segment of left lobe of liver. No focal liver lesion identified. GALLBLADDER AND BILIARY SYSTEM: Large stone identified within the gallbladder neck measures 2.7 cm, image 15/4. Layering gallbladder sludge is also identified. Equivocal gallbladder wall thickening measuring up to 3 mm, image 29/26. The MRCP images are significantly motion degraded. Intermediate T2 signal is noted in the common bile duct. The common bile duct measures up to 9 mm and there is intermediate T1 and T2 signal throughout the course of the common bile duct which is likely filled with gallbladder sludge. Mild central biliary dilatation identified. No signs of choledocholithiasis. SPLEEN: Normal appearance of the spleen. PANCREAS/PANCREATIC DUCT: The main pancreatic duct is upper limits of normal measuring 3 mm. Diffuse edema throughout the course of the pancreas is identified concerning for acute pancreatitis. No signs of pancreatic necrosis or pseudocyst. ADRENAL GLANDS: Normal size and morphology bilaterally. No nodule, thickening, or hemorrhage. No periadrenal stranding. KIDNEYS: Exophytic cyst of the upper pole of right kidney measures 1.9 cm; this is uniformly T2 hyperintense without internal enhancement. No follow-up imaging recommended. Left renal cortical scarring. LYMPH NODES: No adenopathy identified. VASCULATURE: The upper abdomen vascularity  appears patent. Aortic atherosclerotic calcifications. PERITONEUM: Small volume of right upper quadrant, perihepatic, and pericholecystic ascites. ABDOMINAL WALL: Fat containing, midline ventral abdominal wall hernia measures 3.2 cm. BOWEL: Grossly unremarkable. No bowel obstruction. BONES: Thoracolumbar scoliosis deformity with convexity towards the left. Lumbar degenerative disc disease. No acute abnormality or worrisome osseous lesion. SOFT TISSUES: Unremarkable. MISCELLANEOUS: Cardiac enlargement. IMPRESSION: 1. Acute pancreatitis with diffuse pancreatic edema. No pancreatic necrosis or pseudocyst. 2. Large gallbladder neck stone measuring 2.7 cm with layering sludge. Equivocal gallbladder wall thickening. 3. Mild central biliary dilatation with common bile duct measuring up to 9 mm, likely filled with sludge. No choledocholithiasis identified; evaluation limited by motion artifact. 4. Small volume perihepatic and pericholecystic ascites. Electronically signed by: Waddell Calk MD 07/31/2024 05:23 AM EST RP Workstation: [id number]   MR 3D Recon At Scanner Result Date: 07/31/2024 EXAM: MRCP WITH AND WITHOUT IV CONTRAST 07/30/2024 10:57:39 PM TECHNIQUE: Multisequence,  multiplanar magnetic resonance images of the abdomen with and without intravenous contrast. MRCP sequences were performed. 8 mL (gadobutrol (GADAVIST) 1 MMOL/ML injection 8 mL GADOBUTROL 1 MMOL/ML IV SOLN). COMPARISON: None available. CLINICAL HISTORY: Pancreatitis, hepatobiliary obstruction, c/f choledocholithiasis. FINDINGS: LIVER: There is relative hypertrophy of the caudate lobe and lateral segment of left lobe of liver. No focal liver lesion identified. GALLBLADDER AND BILIARY SYSTEM: Large stone identified within the gallbladder neck measures 2.7 cm, image 15/4. Layering gallbladder sludge is also identified. Equivocal gallbladder wall thickening measuring up to 3 mm, image 29/26. The MRCP images are significantly motion degraded.  Intermediate T2 signal is noted in the common bile duct. The common bile duct measures up to 9 mm and there is intermediate T1 and T2 signal throughout the course of the common bile duct which is likely filled with gallbladder sludge. Mild central biliary dilatation identified. No signs of choledocholithiasis. SPLEEN: Normal appearance of the spleen. PANCREAS/PANCREATIC DUCT: The main pancreatic duct is upper limits of normal measuring 3 mm. Diffuse edema throughout the course of the pancreas is identified concerning for acute pancreatitis. No signs of pancreatic necrosis or pseudocyst. ADRENAL GLANDS: Normal size and morphology bilaterally. No nodule, thickening, or hemorrhage. No periadrenal stranding. KIDNEYS: Exophytic cyst of the upper pole of right kidney measures 1.9 cm; this is uniformly T2 hyperintense without internal enhancement. No follow-up imaging recommended. Left renal cortical scarring. LYMPH NODES: No adenopathy identified. VASCULATURE: The upper abdomen vascularity appears patent. Aortic atherosclerotic calcifications. PERITONEUM: Small volume of right upper quadrant, perihepatic, and pericholecystic ascites. ABDOMINAL WALL: Fat containing, midline ventral abdominal wall hernia measures 3.2 cm. BOWEL: Grossly unremarkable. No bowel obstruction. BONES: Thoracolumbar scoliosis deformity with convexity towards the left. Lumbar degenerative disc disease. No acute abnormality or worrisome osseous lesion. SOFT TISSUES: Unremarkable. MISCELLANEOUS: Cardiac enlargement. IMPRESSION: 1. Acute pancreatitis with diffuse pancreatic edema. No pancreatic necrosis or pseudocyst. 2. Large gallbladder neck stone measuring 2.7 cm with layering sludge. Equivocal gallbladder wall thickening. 3. Mild central biliary dilatation with common bile duct measuring up to 9 mm, likely filled with sludge. No choledocholithiasis identified; evaluation limited by motion artifact. 4. Small volume perihepatic and pericholecystic  ascites. Electronically signed by: Waddell Calk MD 07/31/2024 05:23 AM EST RP Workstation: [id number]   Disposition Plan & Communication  Patient status: Inpatient  Admitted From: assisted living Planned disposition location: TBD Anticipated discharge date: TBD pending clinical improvement   Family Communication: daughter at bedside    Author: Marien LITTIE Piety, DO [facility] 08/01/2024, 3:38 PM   Available by Epic secure chat 7AM-7PM. If 7PM-7AM, please contact night-coverage.  [facility] contact information found on [email or web address].

## 2024-08-01 NOTE — Progress Notes (Signed)
 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 Can we get blood cultures and a Lactic on him      Pertinent findings on chart review: Per progress note on 07/31/24:  sepsis diagnosis on 07/31/24  Acute pancreatitis w/ elevated lipase Possible Choledocholithiasis w/ Stone already passed spontaneously Transaminitis w/ elevated LFTs AST ALT TBIli  Now w/ elevated WBC and continued tachypnea = sepsis 07/31/24 GI following, per Dr Jinny - not good ERCP candidate GI recs for IR to eval for cholecystotomy.   Added Zosyn 07/31/24 AM Holding Eliquis -  last dose 00:14 11/04. Ideally would like off this for 2 days prior to ERCP  Cardiology clearance requested prior to any procedures   Patient Assessment        08/01/24 1900 119 Abnormal  32 Abnormal  106/66 94 %  08/01/24 1845 85 40 Abnormal  -- 98 %  08/01/24 1830 88 24 Abnormal  86/58 Abnormal  98 %  08/01/24 1815 89 36 Abnormal  -- 97 %  08/01/24 1800 94 23 Abnormal  90/49 Abnormal  97 %  08/01/24 1745 84 32 Abnormal  97/52 Abnormal  94 %  08/01/24 1730 84 25 Abnormal  86/51 Abnormal  95 %  08/01/24 1715 74 29 Abnormal  -- 96 %  08/01/24 1700 91 30 Abnormal  101/58 Abnormal  95 %  08/01/24 1645 88 28 Abnormal  -- 96 %  08/01/24 1630 84 37 Abnormal  96/64 95 %  08/01/24 1615 53 Abnormal  18 -- 85 % Abnormal   08/01/24 1600 85 28 Abnormal  92/56 Abnormal  96 %  08/01/24 1550 89 37 Abnormal  -- 96 %  08/01/24 1545 89 26 Abnormal  -- 97 %  08/01/24 1540 86 32 Abnormal  89/45 Abnormal  96 %  08/01/24 1530 84 34 Abnormal  93/75 98 %  08/01/24 1520 81 20 97/50 Abnormal  96 %  08/01/24 1515 85 28 Abnormal  -- 95 %  08/01/24 1510 84 28 Abnormal  95/54 Abnormal  94 %  08/01/24  1500 95 31 Abnormal  77/50 Abnormal  94 %  08/01/24 1450 84 40 Abnormal  78/50 Abnormal  97 %  08/01/24 1440 88 44 Abnormal  93/53 Abnormal  95 %  08/01/24 1430 111 Abnormal  31 Abnormal  104/55 Abnormal  96 %  08/01/24 1420 91 33 Abnormal  92/58 Abnormal  97 %  08/01/24 1419 94 18 102/58 Abnormal  96 %  08/01/24 1418 96 25 Abnormal  -- 96 %  08/01/24 1413 93 42 Abnormal  -- 93 %  08/01/24 1412 94 41 Abnormal  -- 96 %  08/01/24 1411 -- 40 Abnormal  -- --  08/01/24 1410 95 18 92/56 Abnormal  97 %  08/01/24 1409 80 37 Abnormal  -- 98 %  08/01/24 1408 89 33 Abnormal  -- 96 %  08/01/24 1407 86 29 Abnormal  -- 95 %  08/01/24 1406 77 36 Abnormal  -- 99 %  08/01/24 1405 88 26 Abnormal  -- 94 %  08/01/24 1404 93 36 Abnormal  -- 97 %  08/01/24 1403 93 37 Abnormal  -- 97 %  08/01/24 1402  106 Abnormal  30 Abnormal  -- 91 %  08/01/24 1401 86 44 Abnormal  -- 98 %  08/01/24 1400 95 37 Abnormal  86/55 Abnormal  100 %  08/01/24 1359 92 21 Abnormal  -- 97 %  08/01/24 1358 90 38 Abnormal  -- 97 %  08/01/24 1357 83 20 -- 96 %  08/01/24 1356 88 49 Abnormal  -- 99 %  08/01/24 1355 91 42 Abnormal  77/53 Abnormal  98 %  08/01/24 1354 90 26 Abnormal  -- 96 %  08/01/24 1353 91 42 Abnormal  -- 97 %  08/01/24 1352 87 34 Abnormal  -- 98 %  08/01/24 1351 85 38 Abnormal  77/53 Abnormal  98 %  08/01/24 1350 95 37 Abnormal  75/52 Abnormal  97 %  08/01/24 1349 84 30 Abnormal  -- 97 %  08/01/24 1348 90 24 Abnormal  -- 98 %     Temp 99.4  BP has been running low since 12:30 PM on 08/01/2024   Assessment and  Interventions   Assessment:  -Sepsis 07/31/2024, now severe-hypotension and AMS -Gallstone pancreatitis pending cholecystostomy -History of CHF -Advanced age with multiple comorbidities  Plan: NS bolus Bolus  and continuous to maintain MAP over 65 Continue Zosyn Monitor for fluid overload due to history of CHF X  Given advanced age and multiple comorbidities, patient is at high risk of  clinical deterioration and not surviving to discharge  Patient currently DNR .Further comfort care conversation if not improving/stabilizing

## 2024-08-01 NOTE — Evaluation (Signed)
 Physical Therapy Evaluation Patient Details Name: David Frey MRN: [id number] DOB: July 23, 1930 Today's Date: 08/01/2024  History of Present Illness  Pt is a 89 y/o M admitted on 07/30/24 after presenting with c/o chest pain. Pt is being treated for acute pancreatitis, severe uncontrolled HTN.  PMH: asthma, a-fib on Eliquis, BPH, HTN, COPD not on home O2, chronic systolic HF, HLD, HTN, CKD, SSS s/p pacemaker placement  Clinical Impression  Pt seen for PT evaluation with pt agreeable, pleasantly confused throughout session. Per nurse, pt is from [facility], ambulatory with rollator. On this date, pt is able to complete bed mobility with min assist, sit>stand with min assist, & transfer bed>recliner with RW & min assist. Pt notes to fatigue after activity. Recommend ongoing PT services to progress mobility as able.        If plan is discharge home, recommend the following: A little help with walking and/or transfers;A little help with bathing/dressing/bathroom;Assist for transportation;Help with stairs or ramp for entrance   Can travel by private vehicle   Yes    Equipment Recommendations  (defer to next venue)  Recommendations for Other Services       Functional Status Assessment Patient has had a recent decline in their functional status and demonstrates the ability to make significant improvements in function in a reasonable and predictable amount of time.     Precautions / Restrictions Precautions Precautions: Fall Restrictions Weight Bearing Restrictions Per Provider Order: No      Mobility  Bed Mobility Overal bed mobility: Needs Assistance Bed Mobility: Supine to Sit     Supine to sit: Min assist, HOB elevated, Used rails (exit R side of bed)          Transfers Overall transfer level: Needs assistance Equipment used: Rolling walker (2 wheels) Transfers: Sit to/from Stand, Bed to chair/wheelchair/BSC Sit to Stand: Min assist, +2 physical assistance   Step  pivot transfers: Min assist, +2 physical assistance, +2 safety/equipment (bed>recliner on R)       General transfer comment: decreased awareness re: safe hand placement during sit>stand    Ambulation/Gait                  Stairs            Wheelchair Mobility     Tilt Bed    Modified Rankin (Stroke Patients Only)       Balance Overall balance assessment: Needs assistance Sitting-balance support: Feet supported Sitting balance-Leahy Scale: Fair     Standing balance support: During functional activity, Bilateral upper extremity supported, Reliant on assistive device for balance Standing balance-Leahy Scale: Poor                               Pertinent Vitals/Pain Pain Assessment Pain Assessment: 0-10    Home Living Family/patient expects to be discharged to:: Assisted living                 Home Equipment: Rollator (4 wheels) Additional Comments: [facility]    Prior Function               Mobility Comments: ambulatory with RW       Extremity/Trunk Assessment   Upper Extremity Assessment Upper Extremity Assessment: Generalized weakness    Lower Extremity Assessment Lower Extremity Assessment: Generalized weakness    Cervical / Trunk Assessment Cervical / Trunk Assessment:  (yellow discoloration)  Communication   Communication Communication: No apparent difficulties  Cognition Arousal: Alert Behavior During Therapy: WFL for tasks assessed/performed   PT - Cognitive impairments: No family/caregiver present to determine baseline, Orientation, Awareness, Memory, Attention, Problem solving, Safety/Judgement   Orientation impairments: Place, Time, Situation                     Following commands: Intact, Impaired Following commands impaired: Follows one step commands with increased time     Cueing Cueing Techniques: Verbal cues     General Comments General comments (skin integrity, edema, etc.):  VSS throughout session, void once standing    Exercises     Assessment/Plan    PT Assessment Patient needs continued PT services  PT Problem List Decreased strength;Cardiopulmonary status limiting activity;Decreased cognition;Decreased activity tolerance;Decreased balance;Decreased mobility;Decreased knowledge of use of DME;Decreased safety awareness       PT Treatment Interventions Balance training;Gait training;Neuromuscular re-education;DME instruction;Cognitive remediation;Functional mobility training;Therapeutic activities;Therapeutic exercise;Patient/family education    PT Goals (Current goals can be found in the Care Plan section)  Acute Rehab PT Goals PT Goal Formulation: Patient unable to participate in goal setting Time For Goal Achievement: 08/15/24 Potential to Achieve Goals: Good    Frequency Min 2X/week     Co-evaluation PT/OT/SLP Co-Evaluation/Treatment: Yes Reason for Co-Treatment: Necessary to address cognition/behavior during functional activity;To address functional/ADL transfers PT goals addressed during session: Mobility/safety with mobility;Balance         AM-PAC PT 6 Clicks Mobility  Outcome Measure Help needed turning from your back to your side while in a flat bed without using bedrails?: A Little Help needed moving from lying on your back to sitting on the side of a flat bed without using bedrails?: A Lot Help needed moving to and from a bed to a chair (including a wheelchair)?: A Little Help needed standing up from a chair using your arms (e.g., wheelchair or bedside chair)?: A Little Help needed to walk in hospital room?: A Little Help needed climbing 3-5 steps with a railing? : A Lot 6 Click Score: 16    End of Session Equipment Utilized During Treatment: Oxygen Activity Tolerance: Patient tolerated treatment well;Patient limited by fatigue Patient left: in chair;with call bell/phone within reach Nurse Communication: Mobility status PT  Visit Diagnosis: Difficulty in walking, not elsewhere classified (R26.2);Muscle weakness (generalized) (M62.81);Other abnormalities of gait and mobility (R26.89);Unsteadiness on feet (R26.81)    Time: 8846-8782 PT Time Calculation (min) (ACUTE ONLY): 24 min   Charges:   PT Evaluation $PT Eval Moderate Complexity: 1 Mod   PT General Charges $$ ACUTE PT VISIT: 1 Visit         Richerd Pinal, PT, DPT 08/01/24, 1:00 PM   Richerd CHRISTELLA Pinal 08/01/2024, 12:58 PM

## 2024-08-01 NOTE — Progress Notes (Signed)
 Patient had a hard time today with confusion. This morning he was a/o x3 this morning and quickly went to a/o x1. Patient was trying to get OOB, pulled out his PIV, took off leads, took off purewick as well as his gown. Patient skin was jaundice as well today. Patient RR had increased and he was c/o SOB, tuned patients O2 up to 4 LPM from 2 LPM via [place]. Made MD aware. New order for CXR. Patient also was having urine retention that started yesterday. He had 2 in and outs over night. This morning he only put our 50 ml and bladder scan was still showing more in the bladder. PT came to work with patient and patient did urinate 100 ml this was after lasix was given but still was showing urine in his bladder with bladder scan. Made MD aware. Family was worried about patient retaining urine. Spoke to MD again about families concerns, MD placed new order for foley. Foley was placed with 400 ml urine return.

## 2024-08-01 NOTE — Consult Note (Signed)
 Chief Complaint: image guided transhepatic cholangiogram with possible biliary drain placement  Referring Provider(s): Jinny Carmine, MD  Supervising Physician: Karalee Beat  Patient Status: [facility] - In-pt  History of Present Illness: David Frey is a 89 y.o. male with medical history of COPD, a fib, CKD, CHF, HTN, sick sinus syndrome s/p pacemaker.  Presented to the ED at [facility] on 11.3.25 with chest pain. Found to be hypoxic with acute pancreatitis and elevated troponin. MRCP from 11.3.23 reads Acute pancreatitis with diffuse pancreatic edema. No pancreatic necrosis or pseudocyst. 2. Large gallbladder neck stone measuring 2.7 cm with layering sludge. Equivocal gallbladder wall thickening. 3. Mild central biliary dilatation with common bile duct measuring up to 9 mm, likely filled with sludge.  No choledocholithiasis identified; evaluation limited by motion artifact. CT CAP from 11.3.24 reads Cholelithiasis    WBC 12.3, alkaline phosphatase 180 , albumin 2.5, AST, 236, ALT 179,  total bilirubin 5.9. On eliquis Last dose given on 11.4.25. @ 0014. Currently on a heparin  gtt. No pertinent allergies. Patient has been NPO.  Received request for image guided transhepatic cholangiogram with possible biliary drain placement. Approved for attempted percutaneous transhepatic cholangiogram and percutaneous biliary drain placement by Dr Karalee.   Allergies Reviewed:  Celecoxib   Patient is DNR Code  Past Medical History:  Diagnosis Date   Actinic keratosis    Arthritis, degenerative 10/21/2015   Asthma without status asthmaticus 12/13/2014   Atrial fibrillation (HCC)    Benign fibroma of prostate 12/13/2014   Benign prostatic hyperplasia with urinary obstruction 10/30/2012   Blood glucose elevated 11/28/2014   BP (high blood pressure) 12/13/2014   Chronic obstructive pulmonary disease (HCC) 12/13/2014   Chronic systolic heart failure (HCC) 07/09/2014   Overview:  Global ef 45%     Combined fat and carbohydrate induced hyperlipemia 04/01/2014   Edema leg 04/01/2014   Excessive urination at night 10/30/2012   Gout 12/13/2014   Hypertension    Impaired renal function 12/13/2014   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, implant, and graft 05/13/2007   Overview:  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    Methicillin susceptible Staphylococcus aureus infection 02/25/2010   Overview:  Methicillin Susceptible Staphylococcus Aureus Infection    MI (mitral incompetence) 07/09/2014   Obstruction to urinary outflow 12/13/2014   Paroxysmal atrial fibrillation (HCC) 07/09/2014    Past Surgical History:  Procedure Laterality Date   APPENDECTOMY  09/27/1941   COLONOSCOPY WITH PROPOFOL  N/A 03/15/2020   Procedure: COLONOSCOPY WITH PROPOFOL ;  Surgeon: Toledo, Ladell POUR, MD;  Location: [facility] ENDOSCOPY;  Service: Gastroenterology;  Laterality: N/A;   ESOPHAGOGASTRODUODENOSCOPY (EGD) WITH PROPOFOL  N/A 03/15/2020   Procedure: ESOPHAGOGASTRODUODENOSCOPY (EGD) WITH PROPOFOL ;  Surgeon: Toledo, Ladell POUR, MD;  Location: [facility] ENDOSCOPY;  Service: Gastroenterology;  Laterality: N/A;   HERNIA REPAIR  09/28/1991   PACEMAKER LEADLESS INSERTION N/A 12/18/2018   Procedure: PACEMAKER LEADLESS INSERTION;  Surgeon: Ammon Blunt, MD;  Location: [facility] INVASIVE CV LAB;  Service: Cardiovascular;  Laterality: N/A;   TONSILLECTOMY  09/27/1964   TOTAL KNEE ARTHROPLASTY  09/27/2005   TRANSURETHRAL RESECTION OF PROSTATE  09/28/1983      Medications: Prior to Admission medications   Medication Sig Start Date End Date Taking? Authorizing Provider  amLODipine  (NORVASC ) 2.5 MG tablet Take 2.5 mg by mouth daily.   Yes [provider]  amoxicillin-clavulanate (AUGMENTIN) 875-125 MG tablet Take 1 tablet by mouth 2 (two) times daily. 07/27/24  Yes Dorothyann Drivers, MD  Apoaequorin (PREVAGEN  PO) Take 1 capsule by mouth daily.   Yes [provider]  calcium   carbonate (OSCAL) 1500 (600 Ca) MG TABS tablet Take 1 tablet by mouth. Takes 3 times a week (Monday, Wednesday, Saturday)   Yes [provider]  COMBIGAN  0.2-0.5 % ophthalmic solution Place 1 drop into the left eye 2 (two) times daily. 11/14/18  Yes [provider]  docusate sodium  (COLACE) 100 MG capsule Take 100 mg by mouth 3 (three) times a week. Tuesday, Thursday, Saturday   Yes [provider]  ELIQUIS 5 MG TABS tablet Take 5 mg by mouth 2 (two) times daily.   Yes [provider]  fesoterodine  (TOVIAZ ) 4 MG TB24 tablet TAKE ONE TABLET BY MOUTH ONE TIME DAILY 01/23/24  Yes Stoioff, Glendia BROCKS, MD  latanoprost  (XALATAN ) 0.005 % ophthalmic solution Place 1 drop into both eyes at bedtime.    Yes [provider]  tamsulosin  (FLOMAX ) 0.4 MG CAPS capsule TAKE 1 CAPSULE BY MOUTH EVERY DAY 12/12/23  Yes Stoioff, Glendia BROCKS, MD     Family History  Problem Relation Age of Onset   Bladder Cancer Neg Hx    Prostate cancer Neg Hx    Kidney cancer Neg Hx     Social History   Socioeconomic History   Marital status: Married    Spouse name: Not on file   Number of children: Not on file   Years of education: Not on file   Highest education level: Not on file  Occupational History   Not on file  Tobacco Use   Smoking status: Never   Smokeless tobacco: Never  Vaping Use   Vaping status: Never Used  Substance and Sexual Activity   Alcohol use: No    Alcohol/week: 0.0 standard drinks of alcohol   Drug use: No   Sexual activity: Yes  Other Topics Concern   Not on file  Social History Narrative   Not on file   Social Drivers of Health   Financial Resource Strain: Low Risk  (02/07/2024)   Received from [facility]   Overall Financial Resource Strain (CARDIA)    Difficulty of Paying Living Expenses: Not hard at all  Food Insecurity: No Food Insecurity (07/30/2024)   Hunger Vital Sign    Worried About Running Out of Food in the Last Year:  Never true    Ran Out of Food in the Last Year: Never true  Transportation Needs: No Transportation Needs (07/30/2024)   PRAPARE - Administrator, Civil Service (Medical): No    Lack of Transportation (Non-Medical): No  Physical Activity: Not on file  Stress: Not on file  Social Connections: Socially Isolated (07/30/2024)   Social Connection and Isolation Panel    Frequency of Communication with Friends and Family: More than three times a week    Frequency of Social Gatherings with Friends and Family: More than three times a week    Attends Religious Services: Never    Database Administrator or Organizations: No    Attends Banker Meetings: Never    Marital Status: Widowed     Review of Systems: A 12 point ROS discussed and pertinent positives are indicated in the HPI above.  All other systems are negative.  Review of Systems  Unable to perform ROS: Mental status change    Vital Signs: BP 88/52 (63)  Pulse 91  Resp 35  SpO2 95%   Advance Care Plan: no documents on file  Physical Exam Constitutional:      General: He is in acute distress.     Appearance: He is ill-appearing.  Cardiovascular:     Rate and Rhythm: Normal rate.  Pulmonary:     Effort: Tachypnea and accessory muscle usage present.     Comments: Labored breathing On oxygen via nasal cannula Neurological:     Mental Status: He is disoriented.     Imaging: DG Chest Port 1 View Result Date: 08/01/2024 EXAM: 1 VIEW(S) XRAY OF THE CHEST 08/01/2024 09:36:07 AM COMPARISON: 07/30/2024 CLINICAL HISTORY: Hypoxia FINDINGS: LUNGS AND PLEURA: Low lung volumes. Bibasilar platelike opacities. Small pleural effusions. No pulmonary edema. No pneumothorax. HEART AND MEDIASTINUM: Cardiomegaly. Cardiac loop recorder device in place. Atherosclerotic calcifications of the aorta. BONES AND SOFT TISSUES: No acute osseous abnormality. IMPRESSION: 1. Low lung volumes with small pleural effusions and  bibasilar platelike atelectasis. 2. Cardiomegaly with cardiac loop recorder device in place. Electronically signed by: Norleen Boxer MD 08/01/2024 02:14 PM EST RP Workstation: [id number]   MR ABDOMEN MRCP W WO CONTAST Result Date: 07/31/2024 EXAM: MRCP WITH AND WITHOUT IV CONTRAST 07/30/2024 10:57:39 PM TECHNIQUE: Multisequence, multiplanar magnetic resonance images of the abdomen with and without intravenous contrast. MRCP sequences were performed. 8 mL (gadobutrol (GADAVIST) 1 MMOL/ML injection 8 mL GADOBUTROL 1 MMOL/ML IV SOLN). COMPARISON: None available. CLINICAL HISTORY: Pancreatitis, hepatobiliary obstruction, c/f choledocholithiasis. FINDINGS: LIVER: There is relative hypertrophy of the caudate lobe and lateral segment of left lobe of liver. No focal liver lesion identified. GALLBLADDER AND BILIARY SYSTEM: Large stone identified within the gallbladder neck measures 2.7 cm, image 15/4. Layering gallbladder sludge is also identified. Equivocal gallbladder wall thickening measuring up to 3 mm, image 29/26. The MRCP images are significantly motion degraded. Intermediate T2 signal is noted in the common bile duct. The common bile duct measures up to 9 mm and there is intermediate T1 and T2 signal throughout the course of the common bile duct which is likely filled with gallbladder sludge. Mild central biliary dilatation identified. No signs of choledocholithiasis. SPLEEN: Normal appearance of the spleen. PANCREAS/PANCREATIC DUCT: The main pancreatic duct is upper limits of normal measuring 3 mm. Diffuse edema throughout the course of the pancreas is identified concerning for acute pancreatitis. No signs of pancreatic necrosis or pseudocyst. ADRENAL GLANDS: Normal size and morphology bilaterally. No nodule, thickening, or hemorrhage. No periadrenal stranding. KIDNEYS: Exophytic cyst of the upper pole of right kidney measures 1.9 cm; this is uniformly T2 hyperintense without internal enhancement. No follow-up  imaging recommended. Left renal cortical scarring. LYMPH NODES: No adenopathy identified. VASCULATURE: The upper abdomen vascularity appears patent. Aortic atherosclerotic calcifications. PERITONEUM: Small volume of right upper quadrant, perihepatic, and pericholecystic ascites. ABDOMINAL WALL: Fat containing, midline ventral abdominal wall hernia measures 3.2 cm. BOWEL: Grossly unremarkable. No bowel obstruction. BONES: Thoracolumbar scoliosis deformity with convexity towards the left. Lumbar degenerative disc disease. No acute abnormality or worrisome osseous lesion. SOFT TISSUES: Unremarkable. MISCELLANEOUS: Cardiac enlargement. IMPRESSION: 1. Acute pancreatitis with diffuse pancreatic edema. No pancreatic necrosis or pseudocyst. 2. Large gallbladder neck stone measuring 2.7 cm with layering sludge. Equivocal gallbladder wall thickening. 3. Mild central biliary dilatation with common bile duct measuring up to 9 mm, likely filled with sludge. No choledocholithiasis identified; evaluation limited by motion artifact. 4. Small volume perihepatic and pericholecystic ascites. Electronically signed by: Waddell Calk MD 07/31/2024 05:23 AM EST RP Workstation: [id number]   MR 3D Recon At Scanner Result Date: 07/31/2024 EXAM: MRCP WITH AND WITHOUT IV CONTRAST 07/30/2024 10:57:39 PM  TECHNIQUE: Multisequence, multiplanar magnetic resonance images of the abdomen with and without intravenous contrast. MRCP sequences were performed. 8 mL (gadobutrol (GADAVIST) 1 MMOL/ML injection 8 mL GADOBUTROL 1 MMOL/ML IV SOLN). COMPARISON: None available. CLINICAL HISTORY: Pancreatitis, hepatobiliary obstruction, c/f choledocholithiasis. FINDINGS: LIVER: There is relative hypertrophy of the caudate lobe and lateral segment of left lobe of liver. No focal liver lesion identified. GALLBLADDER AND BILIARY SYSTEM: Large stone identified within the gallbladder neck measures 2.7 cm, image 15/4. Layering gallbladder sludge is also identified.  Equivocal gallbladder wall thickening measuring up to 3 mm, image 29/26. The MRCP images are significantly motion degraded. Intermediate T2 signal is noted in the common bile duct. The common bile duct measures up to 9 mm and there is intermediate T1 and T2 signal throughout the course of the common bile duct which is likely filled with gallbladder sludge. Mild central biliary dilatation identified. No signs of choledocholithiasis. SPLEEN: Normal appearance of the spleen. PANCREAS/PANCREATIC DUCT: The main pancreatic duct is upper limits of normal measuring 3 mm. Diffuse edema throughout the course of the pancreas is identified concerning for acute pancreatitis. No signs of pancreatic necrosis or pseudocyst. ADRENAL GLANDS: Normal size and morphology bilaterally. No nodule, thickening, or hemorrhage. No periadrenal stranding. KIDNEYS: Exophytic cyst of the upper pole of right kidney measures 1.9 cm; this is uniformly T2 hyperintense without internal enhancement. No follow-up imaging recommended. Left renal cortical scarring. LYMPH NODES: No adenopathy identified. VASCULATURE: The upper abdomen vascularity appears patent. Aortic atherosclerotic calcifications. PERITONEUM: Small volume of right upper quadrant, perihepatic, and pericholecystic ascites. ABDOMINAL WALL: Fat containing, midline ventral abdominal wall hernia measures 3.2 cm. BOWEL: Grossly unremarkable. No bowel obstruction. BONES: Thoracolumbar scoliosis deformity with convexity towards the left. Lumbar degenerative disc disease. No acute abnormality or worrisome osseous lesion. SOFT TISSUES: Unremarkable. MISCELLANEOUS: Cardiac enlargement. IMPRESSION: 1. Acute pancreatitis with diffuse pancreatic edema. No pancreatic necrosis or pseudocyst. 2. Large gallbladder neck stone measuring 2.7 cm with layering sludge. Equivocal gallbladder wall thickening. 3. Mild central biliary dilatation with common bile duct measuring up to 9 mm, likely filled with sludge.  No choledocholithiasis identified; evaluation limited by motion artifact. 4. Small volume perihepatic and pericholecystic ascites. Electronically signed by: Waddell Calk MD 07/31/2024 05:23 AM EST RP Workstation: [id number]   CT Angio Chest/Abd/Pel for Dissection W and/or Wo Contrast Result Date: 07/30/2024 CLINICAL DATA:  Chest pain, shortness of breath. EXAM: CT ANGIOGRAPHY CHEST, ABDOMEN AND PELVIS TECHNIQUE: Non-contrast CT of the chest was initially obtained. Multidetector CT imaging through the chest, abdomen and pelvis was performed using the standard protocol during bolus administration of intravenous contrast. Multiplanar reconstructed images and MIPs were obtained and reviewed to evaluate the vascular anatomy. RADIATION DOSE REDUCTION: This exam was performed according to the departmental dose-optimization program which includes automated exposure control, adjustment of the mA and/or kV according to patient size and/or use of iterative reconstruction technique. CONTRAST:  100mL OMNIPAQUE IOHEXOL 350 MG/ML SOLN COMPARISON:  None Available. FINDINGS: CTA CHEST FINDINGS Cardiovascular: No evidence of an aortic intramural hematoma on precontrast imaging or aortic dissection or aneurysm on postcontrast imaging. Atherosclerotic calcification of the aorta, aortic valve and coronary arteries. Heart is enlarged. No pericardial effusion. Enlarged pulmonic trunk. Mediastinum/Nodes: No pathologically enlarged mediastinal, hilar or axillary lymph nodes. Air in the esophagus can be seen with dysmotility. Lungs/Pleura: Mild dependent atelectasis bilaterally. Pulmonary nodules measure 6 mm or less in size. No specific follow-up recommended in a patient of this age. No pleural fluid. Airway is unremarkable. Musculoskeletal: Degenerative changes  in the spine. Review of the MIP images confirms the above findings. CTA ABDOMEN AND PELVIS FINDINGS VASCULAR Aorta: No aneurysm or dissection.  Atherosclerotic calcification.  Celiac: Ostial calcification without significant luminal narrowing. Widely patent. SMA: Ostial calcification.  Widely patent. Renals: Ostial calcification bilaterally.  Widely patent. IMA: Widely patent. Inflow: Minimally ectatic but not aneurysmal. Atherosclerotic calcification. No dissection. Veins: Poorly evaluated due to lack of opacification. Review of the MIP images confirms the above findings. NON-VASCULAR Hepatobiliary: Liver is unremarkable. Gallstones. No biliary ductal dilatation. Pancreas: Negative. Spleen: Negative. Adrenals/Urinary Tract: Adrenal glands are unremarkable. Low-attenuation lesions in the kidneys. No specific follow-up necessary. Renal cortical scarring and thinning bilaterally. Ureters are decompressed. Small right lateral bladder diverticulum. Stomach/Bowel: Small hiatal hernia. Stomach is otherwise unremarkable. Duodenal diverticula are incidentally noted. Small bowel and colon are otherwise unremarkable. Appendix is reportedly absent. Lymphatic: No pathologically enlarged lymph nodes. Reproductive: Prostate is visualized. Other: Small bilateral inguinal hernias contain fat. Small umbilical hernia contains fat. No free fluid. Musculoskeletal: Degenerative changes in the spine. Osteopenia. Mild levoconvex scoliosis. Review of the MIP images confirms the above findings. IMPRESSION: 1. No evidence of acute aortic syndrome or aneurysm. 2. Cholelithiasis. 3. Aortic atherosclerosis (ICD10-I70.0). Coronary artery calcification. 4. Enlarged pulmonic trunk, indicative of pulmonary arterial hypertension. Electronically Signed   By: Newell Eke M.D.   On: 07/30/2024 14:32   DG Chest Portable 1 View Result Date: 07/30/2024 CLINICAL DATA:  Substernal chest pain and shortness of breath. EXAM: PORTABLE CHEST 1 VIEW COMPARISON:  09/12/2009. FINDINGS: Trachea is midline. Heart is enlarged. Lead less pacemaker projects over the left heart. Lungs are low in volume but clear. No pleural fluid.  IMPRESSION: No acute findings. Electronically Signed   By: Newell Eke M.D.   On: 07/30/2024 14:22   CT HEAD WO CONTRAST ( ) Result Date: 07/27/2024 EXAM: CT HEAD WITHOUT CONTRAST 07/27/2024 09:18:04 AM TECHNIQUE: CT of the head was performed without the administration of intravenous contrast. Automated exposure control, iterative reconstruction, and/or weight based adjustment of the mA/kV was utilized to reduce the radiation dose to as low as reasonably achievable. COMPARISON: None available. CLINICAL HISTORY: Headache, new onset (Age >= 51y). FINDINGS: BRAIN AND VENTRICLES: No acute hemorrhage. No evidence of acute infarct. No hydrocephalus. No extra-axial collection. No mass effect or midline shift. Age-related cerebral volume loss, not unusual for age. Mild periventricular and deep cerebral white matter disease. ORBITS: No acute abnormality. Status post bilateral lens replacement. SINUSES: Complete opacification of the right maxillary sinus, anterior ethmoid air cells and frontal sinuses. SOFT TISSUES AND SKULL: No acute soft tissue abnormality. No skull fracture. VASCULATURE: Moderate to severe atheromatous calcification within the carotid siphons and vertebral arteries. IMPRESSION: 1. No acute intracranial abnormality. 2. Complete opacification of the right maxillary sinus, anterior ethmoid air cells, and frontal sinuses. Electronically signed by: Evalene Coho MD 07/27/2024 09:25 AM EDT RP Workstation: [id number]    Labs:  CBC: Recent Labs    07/27/24 0839 07/30/24 1230 07/31/24 0130 08/01/24 0604  WBC 7.0 7.8 12.3* 10.5  HGB 14.4 13.9 11.4* 11.5*  HCT 43.6 43.1 34.8* 35.8*  PLT 141* 161 139* 130*    COAGS: Recent Labs    07/30/24 1230 07/31/24 2009 08/01/24 0604 08/01/24 1407  INR 1.5*  --   --   --   APTT  --  109* 82* 42*    BMP: Recent Labs    07/27/24 0839 07/30/24 1230 07/31/24 0130 08/01/24 0604  NA 141 138 138 138  K 3.8 3.9  4.2 4.7  CL 102 99 100 102   CO2 25 27 26 26   GLUCOSE 110* 113* 125* 88  BUN 21 24* 29* 36*  CALCIUM  9.2 9.0 8.6* 8.3*  CREATININE 1.03 1.15 1.14 1.00  GFRNONAA >60 60* >60 >60    LIVER FUNCTION TESTS: Recent Labs    07/27/24 0839 07/30/24 1230 07/31/24 0130 08/01/24 0604  BILITOT 1.3* 4.0* 5.9* 7.7*  AST 20 266* 236* 134*  ALT 10 116* 179* 143*  ALKPHOS 72 173* 180* 174*  PROT 8.6* 8.5* 6.9 6.8  ALBUMIN 3.5 3.1* 2.5* 2.3*    TUMOR MARKERS: No results for input(s): AFPTM, CEA, CA199, CHROMGRNA in the last 8760 hours.   Assessment and Plan:  Request for image guided transhepatic cholangiogram with possible biliary drain placement.  Labs reviewed and within acceptable range Imaging available and reviewed by Dr Karalee   Risks and benefits of image guided transhepatic cholangiogram with possible biliary drain placement discussed with the patient including, but not limited to bleeding, infection which may lead to sepsis or even death and damage to adjacent structures.  This interventional procedure involves the use of X-rays and because of the nature of the planned procedure, it is possible that we will have prolonged use of X-ray fluoroscopy.  Potential radiation risks to you include (but are not limited to) the following: - A slightly elevated risk for cancer several years later in life. This risk is typically less than 0.5% percent. This risk is low in comparison to the normal incidence of human cancer, which is 33% for women and 50% for men according to the [employer] Society. - Radiation induced injury can include skin redness, resembling a rash, tissue breakdown / ulcers and hair loss (which can be temporary or permanent).   The likelihood of either of these occurring depends on the difficulty of the procedure and whether you are sensitive to radiation due to previous procedures, disease, or genetic conditions.   IF your procedure requires a prolonged use of radiation, you will  be notified and given written instructions for further action.  It is your responsibility to monitor the irradiated area for the 2 weeks following the procedure and to notify your physician if you are concerned that you have suffered a radiation induced injury.    All of the patient's questions were answered, patient is agreeable to proceed.  Consent signed and in IR control room. Consent obtained from patient's daughter, Almetta.   Anatomically approved for procedure; however, patient needs to be medically stable in order to proceed.     Thank you for allowing our service to participate in GORGE ALMANZA 's care.    Electronically Signed: Aldean Suddeth B Josclyn Rosales, NP   08/01/2024, 3:30 PM     I spent a total of 40 Minutes in face to face in clinical consultation, greater than 50% of which was counseling/coordinating care for image guided transhepatic cholangiogram with possible biliary drain placement.   (A copy of this note was sent to the referring provider and the time of visit.)

## 2024-08-02 DIAGNOSIS — I161 Hypertensive emergency: Secondary | ICD-10-CM | POA: Diagnosis not present

## 2024-08-02 DIAGNOSIS — I4891 Unspecified atrial fibrillation: Secondary | ICD-10-CM | POA: Diagnosis not present

## 2024-08-02 DIAGNOSIS — I480 Paroxysmal atrial fibrillation: Secondary | ICD-10-CM | POA: Diagnosis not present

## 2024-08-02 LAB — CBC
HCT: 29 % — ABNORMAL LOW (ref 39.0–52.0)
Hemoglobin: 9.6 g/dL — ABNORMAL LOW (ref 13.0–17.0)
MCH: 32.3 pg (ref 26.0–34.0)
MCHC: 33.1 g/dL (ref 30.0–36.0)
MCV: 97.6 fL (ref 80.0–100.0)
Platelets: 124 K/uL — ABNORMAL LOW (ref 150–400)
RBC: 2.97 MIL/uL — ABNORMAL LOW (ref 4.22–5.81)
RDW: 14.4 % (ref 11.5–15.5)
WBC: 7 K/uL (ref 4.0–10.5)
nRBC: 0 % (ref 0.0–0.2)

## 2024-08-02 LAB — COMPREHENSIVE METABOLIC PANEL WITH GFR
ALT: 100 U/L — ABNORMAL HIGH (ref 0–44)
AST: 84 U/L — ABNORMAL HIGH (ref 15–41)
Albumin: 2.1 g/dL — ABNORMAL LOW (ref 3.5–5.0)
Alkaline Phosphatase: 167 U/L — ABNORMAL HIGH (ref 38–126)
Anion gap: 10 (ref 5–15)
BUN: 67 mg/dL — ABNORMAL HIGH (ref 8–23)
CO2: 25 mmol/L (ref 22–32)
Calcium: 7.9 mg/dL — ABNORMAL LOW (ref 8.9–10.3)
Chloride: 103 mmol/L (ref 98–111)
Creatinine, Ser: 1.77 mg/dL — ABNORMAL HIGH (ref 0.61–1.24)
GFR, Estimated: 36 mL/min — ABNORMAL LOW (ref >60)
Glucose, Bld: 119 mg/dL — ABNORMAL HIGH (ref 70–99)
Potassium: 3.6 mmol/L (ref 3.5–5.1)
Sodium: 138 mmol/L (ref 135–145)
Total Bilirubin: 9.5 mg/dL — ABNORMAL HIGH (ref 0.0–1.2)
Total Protein: 6.3 g/dL — ABNORMAL LOW (ref 6.5–8.1)

## 2024-08-02 LAB — TROPONIN I (HIGH SENSITIVITY): Troponin I (High Sensitivity): 78 ng/L — ABNORMAL HIGH (ref <18)

## 2024-08-02 LAB — URINE CULTURE: Culture: NO GROWTH

## 2024-08-02 LAB — HEPARIN LEVEL (UNFRACTIONATED): Heparin Unfractionated: >1.1 [IU]/mL — ABNORMAL HIGH (ref 0.30–0.70)

## 2024-08-02 LAB — APTT
aPTT: 117 s — ABNORMAL HIGH (ref 24–36)
aPTT: 89 s — ABNORMAL HIGH (ref 24–36)
aPTT: >200 s (ref 24–36)

## 2024-08-02 MED ORDER — HEPARIN (PORCINE) 25000 UT/250ML-% IV SOLN
950.0000 [IU]/h | INTRAVENOUS | Status: DC
  Administered 2024-08-02: 1100 [IU]/h via INTRAVENOUS
  Administered 2024-08-03: 950 [IU]/h via INTRAVENOUS
  Filled 2024-08-02 (×2): qty 250

## 2024-08-02 MED ORDER — LACTATED RINGERS IV SOLN: INTRAVENOUS | Status: AC

## 2024-08-02 MED ORDER — SODIUM CHLORIDE 0.9 % IV SOLN: INTRAVENOUS | Status: AC

## 2024-08-02 NOTE — Progress Notes (Signed)
 Physical Therapy Treatment Patient Details Name: MORAD TAL MRN: [id number] DOB: 04/09/1930 Today's Date: 08/02/2024   History of Present Illness Pt is a 89 y/o M admitted on 07/30/24 after presenting with c/o chest pain. Pt is being treated for acute pancreatitis, severe uncontrolled HTN.  PMH: asthma, a-fib on Eliquis, BPH, HTN, COPD not on home O2, chronic systolic HF, HLD, HTN, CKD, SSS s/p pacemaker placement    PT Comments  Patient is agreeable to PT session. He is lethargic but with increased alertness with mobility. Daughter at the bedside. Patient required +2 person assistance for standing x 2 bouts. Activity tolerance limited by fatigue and generalized weakness. Unable to progress walking today. Recommend to continue PT to maximize independence. Consider rehabilitation < 3 hours/day after this hospital stay.    If plan is discharge home, recommend the following: A little help with walking and/or transfers;A little help with bathing/dressing/bathroom;Assist for transportation;Help with stairs or ramp for entrance   Can travel by private vehicle     Yes  Equipment Recommendations   (TBD at next level of care)    Recommendations for Other Services       Precautions / Restrictions Precautions Precautions: Fall Recall of Precautions/Restrictions: Impaired Restrictions Weight Bearing Restrictions Per Provider Order: No     Mobility  Bed Mobility Overal bed mobility: Needs Assistance Bed Mobility: Supine to Sit, Sit to Supine     Supine to sit: Mod assist, Min assist, +2 for physical assistance Sit to supine: Mod assist   General bed mobility comments: cues for technique    Transfers Overall transfer level: Needs assistance Equipment used: Rolling walker (2 wheels) Transfers: Sit to/from Stand Sit to Stand: Mod assist, Min assist, +2 physical assistance           General transfer comment: lifting and lowering assistance provided. cues for technique     Ambulation/Gait             Pre-gait activities: difficulty with side steps along edge of bed, +2 person assistance MinA-ModA using rolling walker. activity tolerance limited by fatigue and generalized weakness. patient overestimates abilities.     Stairs             Wheelchair Mobility     Tilt Bed    Modified Rankin (Stroke Patients Only)       Balance Overall balance assessment: Needs assistance Sitting-balance support: Feet supported Sitting balance-Leahy Scale: Fair     Standing balance support: Bilateral upper extremity supported Standing balance-Leahy Scale: Poor Standing balance comment: external support required. emphasis on upright standing posture                            Communication Communication Communication: No apparent difficulties  Cognition Arousal: Lethargic Behavior During Therapy: WFL for tasks assessed/performed   PT - Cognitive impairments: Orientation, Memory, Initiation, Sequencing                         Following commands: Impaired Following commands impaired: Follows one step commands with increased time    Cueing Cueing Techniques: Verbal cues, Tactile cues, Visual cues  Exercises      General Comments General comments (skin integrity, edema, etc.): supportive daughter at the bedside for most of session. vitals appear stable on 2 L02      Pertinent Vitals/Pain Pain Assessment Pain Assessment: No/denies pain    Home Living  Prior Function            PT Goals (current goals can now be found in the care plan section) Acute Rehab PT Goals Patient Stated Goal: none stated PT Goal Formulation: Patient unable to participate in goal setting Time For Goal Achievement: 08/15/24 Potential to Achieve Goals: Good Progress towards PT goals: Progressing toward goals    Frequency    Min 2X/week      PT Plan      Co-evaluation PT/OT/SLP  Co-Evaluation/Treatment: Yes Reason for Co-Treatment: Necessary to address cognition/behavior during functional activity;To address functional/ADL transfers PT goals addressed during session: Mobility/safety with mobility;Balance        AM-PAC PT 6 Clicks Mobility   Outcome Measure  Help needed turning from your back to your side while in a flat bed without using bedrails?: A Little Help needed moving from lying on your back to sitting on the side of a flat bed without using bedrails?: A Lot Help needed moving to and from a bed to a chair (including a wheelchair)?: A Lot Help needed standing up from a chair using your arms (e.g., wheelchair or bedside chair)?: A Lot Help needed to walk in hospital room?: A Lot Help needed climbing 3-5 steps with a railing? : Total 6 Click Score: 12    End of Session Equipment Utilized During Treatment: Oxygen Activity Tolerance: Patient tolerated treatment well Patient left: in bed;with call bell/phone within reach;with bed alarm set;with family/visitor present Nurse Communication: Mobility status PT Visit Diagnosis: Difficulty in walking, not elsewhere classified (R26.2);Muscle weakness (generalized) (M62.81);Other abnormalities of gait and mobility (R26.89);Unsteadiness on feet (R26.81)     Time: 8984-8961 PT Time Calculation (min) (ACUTE ONLY): 23 min  Charges:    $Therapeutic Activity: 8-22 mins PT General Charges $$ ACUTE PT VISIT: 1 Visit                     Randine Essex, PT, MPT    Randine LULLA Essex 08/02/2024, 11:59 AM

## 2024-08-02 NOTE — Progress Notes (Signed)
 PHARMACY - ANTICOAGULATION CONSULT NOTE  Pharmacy Consult for heparin  infusion Indication: atrial fibrillation  Allergies  Allergen Reactions   Celecoxib Rash and Other (See Comments)    Hallucination    Patient Measurements: Height: 5' 6 (167.6 cm) Weight: 80.3 kg (177 lb) IBW/kg (Calculated) : 63.8 HEPARIN  DW (KG): 79.8  Vital Signs: Temp: 97.8 F (36.6 C) (11/06 0724) Temp Source: Oral (11/06 0724) BP: 94/73 (11/06 1300) Pulse Rate: 71 (11/06 1230)  Labs: Recent Labs     0000 07/30/24 2245 07/31/24 0130 07/31/24 0645 07/31/24 2009 08/01/24 0604 08/01/24 1407 08/02/24 0049 08/02/24 0457 08/02/24 1242  HGB   < >  --  11.4*  --   --  11.5*  --   --  9.6*  --   HCT  --   --  34.8*  --   --  35.8*  --   --  29.0*  --   PLT  --   --  139*  --   --  130*  --   --  124*  --   APTT  --   --   --   --    < > 82* 42* >200*  --  117*  HEPARINUNFRC  --   --   --   --   --  >1.10*  --   --   --  >1.10*  CREATININE  --   --  1.14  --   --  1.00  --   --  1.77*  --   TROPONINIHS  --  69*  --  78*  --   --   --   --  78*  --    < > = values in this interval not displayed.    Estimated Creatinine Clearance: 26.5 mL/min (A) (by C-G formula based on SCr of 1.77 mg/dL (H)).   Medical History: Past Medical History:  Diagnosis Date   Actinic keratosis    Arthritis, degenerative 10/21/2015   Asthma without status asthmaticus 12/13/2014   Atrial fibrillation (HCC)    Benign fibroma of prostate 12/13/2014   Benign prostatic hyperplasia with urinary obstruction 10/30/2012   Blood glucose elevated 11/28/2014   BP (high blood pressure) 12/13/2014   Chronic obstructive pulmonary disease (HCC) 12/13/2014   Chronic systolic heart failure (HCC) 07/09/2014   Overview:  Global ef 45%    Combined fat and carbohydrate induced hyperlipemia 04/01/2014   Edema leg 04/01/2014   Excessive urination at night 10/30/2012   Gout 12/13/2014   Hypertension    Impaired renal function 12/13/2014   Infection  and inflammatory reaction due to internal prosthetic device, implant, and graft 05/13/2007   Overview:  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    Methicillin susceptible Staphylococcus aureus infection 02/25/2010   Overview:  Methicillin Susceptible Staphylococcus Aureus Infection    MI (mitral incompetence) 07/09/2014   Obstruction to urinary outflow 12/13/2014   Paroxysmal atrial fibrillation (HCC) 07/09/2014    Medications:  Scheduled:   brimonidine   1 drop Left Eye BID   And   timolol   1 drop Left Eye BID   Chlorhexidine Gluconate Cloth  6 each Topical Daily   feeding supplement  1 Container Oral TID BM   latanoprost   1 drop Both Eyes QHS   pantoprazole  (PROTONIX ) IV  40 mg Intravenous Daily   sodium chloride  flush  3 mL Intravenous Q12H   tamsulosin   0.8 mg Oral Daily   Infusions:   heparin  1,100 Units/hr (08/02/24 0735)  piperacillin-tazobactam 12.5 mL/hr at 08/02/24 0735   PRN: bisacodyl, ipratropium-albuterol , morphine injection, ondansetron  **OR** ondansetron  (ZOFRAN ) IV, mouth rinse, oxyCODONE , traZODone  Assessment: 89 y.o. male [name] with a known history of asthma A-fib BPH hypertension COPD chronic systolic heart failure with an EF 45%, hyperlipidemia, hypertension, CKD presents to the emergency department for evaluation of chest pain. Last apixaban dose 07/31/24 0014.  Goal of Therapy:  anti-Xa level 0.3-0.7 units/ml aPTT 66 - 102 seconds Monitor platelets by anticoagulation protocol: Yes  11/4 2009: aPTT 109, SUPRAtherapeutic 11/5 0604: aPTT  82, therapeutic x1 11/5 1407: aPTT  42, SUBtherapeutic @ 1150 units/hr 11/6 0049: aPTT  > 200, SUPRAtherapeutic  11/6 1242: aPTT  117, SUPRAtherapeutic @ 1100 units/hr   Plan:  aPTT elevated, will decrease rate to 950 units/hr Will recheck aPTT in 8 hours  Will use aPTT to guide therapy until aPTT and anti-Xa level correlate)  Continue to monitor H&H and platelets Daily CBC  David Frey [facility] 08/02/2024 1:50 PM

## 2024-08-02 NOTE — Progress Notes (Addendum)
 [facility]IN[facility] CARDIOLOGY PROGRESS NOTE       Patient ID: David Frey MRN: [id number] DOB/AGE: 1929/10/30 89 y.o.  Admit date: 07/30/2024 Referring Physician Dr. Thersia Roger  Primary Physician Lenon Layman ORN, MD  Primary Cardiologist Dr. Wilburn Reason for Consultation Elevated troponin, chest pain  HPI: David Frey is a 89 y.o. male  with a past medical history of sick sinus syndrome s/p Micra pacer implant 2020, chronic atrial fibrillation, hypertension, moderate mitral insufficiency, hyperlipidemia who presented to the ED on 07/30/2024 for chest pain. Trops mildly elevated. Lipase found to be significantly elevated and MRCP demonstrated acute pancreatitis. Cardiology was consulted for further evaluation.   Interval history: -Patient seen and examined this AM, resting in bed.  -States he is without CP or SOB. Weaned to 2L [place], required more O2 yesterday.  -Cr up after IV lasix yesterday.  -Had some low BP overnight yesterday and received fluid bolus. BP borderline today but overall improved.   Review of systems complete and found to be negative unless listed above    Past Medical History:  Diagnosis Date   Actinic keratosis    Arthritis, degenerative 10/21/2015   Asthma without status asthmaticus 12/13/2014   Atrial fibrillation (HCC)    Benign fibroma of prostate 12/13/2014   Benign prostatic hyperplasia with urinary obstruction 10/30/2012   Blood glucose elevated 11/28/2014   BP (high blood pressure) 12/13/2014   Chronic obstructive pulmonary disease (HCC) 12/13/2014   Chronic systolic heart failure (HCC) 07/09/2014   Overview:  Global ef 45%    Combined fat and carbohydrate induced hyperlipemia 04/01/2014   Edema leg 04/01/2014   Excessive urination at night 10/30/2012   Gout 12/13/2014   Hypertension    Impaired renal function 12/13/2014   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, implant, and graft 05/13/2007   Overview:  Infection Due  To An Internal Joint Prosthesis    Methicillin susceptible Staphylococcus aureus infection 02/25/2010   Overview:  Methicillin Susceptible Staphylococcus Aureus Infection    MI (mitral incompetence) 07/09/2014   Obstruction to urinary outflow 12/13/2014   Paroxysmal atrial fibrillation (HCC) 07/09/2014    Past Surgical History:  Procedure Laterality Date   APPENDECTOMY  09/27/1941   COLONOSCOPY WITH PROPOFOL  N/A 03/15/2020   Procedure: COLONOSCOPY WITH PROPOFOL ;  Surgeon: Toledo, Ladell POUR, MD;  Location: [facility] ENDOSCOPY;  Service: Gastroenterology;  Laterality: N/A;   ESOPHAGOGASTRODUODENOSCOPY (EGD) WITH PROPOFOL  N/A 03/15/2020   Procedure: ESOPHAGOGASTRODUODENOSCOPY (EGD) WITH PROPOFOL ;  Surgeon: Toledo, Ladell POUR, MD;  Location: [facility] ENDOSCOPY;  Service: Gastroenterology;  Laterality: N/A;   HERNIA REPAIR  09/28/1991   PACEMAKER LEADLESS INSERTION N/A 12/18/2018   Procedure: PACEMAKER LEADLESS INSERTION;  Surgeon: Ammon Blunt, MD;  Location: [facility] INVASIVE CV LAB;  Service: Cardiovascular;  Laterality: N/A;   TONSILLECTOMY  09/27/1964   TOTAL KNEE ARTHROPLASTY  09/27/2005   TRANSURETHRAL RESECTION OF PROSTATE  09/28/1983    Medications Prior to Admission  Medication Sig Dispense Refill Last Dose/Taking   amLODipine  (NORVASC ) 2.5 MG tablet Take 2.5 mg by mouth daily.   07/30/2024 at  8:03 AM   amoxicillin-clavulanate (AUGMENTIN) 875-125 MG tablet Take 1 tablet by mouth 2 (two) times daily. 14 tablet 0 07/30/2024 at  8:03 AM   Apoaequorin (PREVAGEN PO) Take 1 capsule by mouth daily.   07/30/2024 at  8:03 AM   calcium  carbonate (OSCAL) 1500 (600 Ca) MG TABS tablet Take 1 tablet by mouth. Takes 3 times a week (Monday, Wednesday, Saturday)   07/30/2024  at  8:03 AM   COMBIGAN  0.2-0.5 % ophthalmic solution Place 1 drop into the left eye 2 (two) times daily.   07/30/2024 at  8:50 AM   docusate sodium  (COLACE) 100 MG capsule Take 100 mg by mouth 3 (three) times a week. Tuesday, Thursday,  Saturday   Unknown   ELIQUIS 5 MG TABS tablet Take 5 mg by mouth 2 (two) times daily.   07/30/2024 at  8:03 AM   fesoterodine  (TOVIAZ ) 4 MG TB24 tablet TAKE ONE TABLET BY MOUTH ONE TIME DAILY 30 tablet 11 07/30/2024 at  8:03 AM   latanoprost  (XALATAN ) 0.005 % ophthalmic solution Place 1 drop into both eyes at bedtime.    07/29/2024 at  8:12 PM   tamsulosin  (FLOMAX ) 0.4 MG CAPS capsule TAKE 1 CAPSULE BY MOUTH EVERY DAY 90 capsule 3 07/30/2024 at  8:04 AM   Social History   Socioeconomic History   Marital status: Married    Spouse name: Not on file   Number of children: Not on file   Years of education: Not on file   Highest education level: Not on file  Occupational History   Not on file  Tobacco Use   Smoking status: Never   Smokeless tobacco: Never  Vaping Use   Vaping status: Never Used  Substance and Sexual Activity   Alcohol use: No    Alcohol/week: 0.0 standard drinks of alcohol   Drug use: No   Sexual activity: Yes  Other Topics Concern   Not on file  Social History Narrative   Not on file   Social Drivers of Health   Financial Resource Strain: Low Risk  (02/07/2024)   Received from [facility]   Overall Financial Resource Strain (CARDIA)    Difficulty of Paying Living Expenses: Not hard at all  Food Insecurity: No Food Insecurity (07/30/2024)   Hunger Vital Sign    Worried About Running Out of Food in the Last Year: Never true    Ran Out of Food in the Last Year: Never true  Transportation Needs: No Transportation Needs (07/30/2024)   PRAPARE - Administrator, Civil Service (Medical): No    Lack of Transportation (Non-Medical): No  Physical Activity: Not on file  Stress: Not on file  Social Connections: Socially Isolated (07/30/2024)   Social Connection and Isolation Panel    Frequency of Communication with Friends and Family: More than three times a week    Frequency of Social Gatherings with Friends and Family: More than three times a  week    Attends Religious Services: Never    Database Administrator or Organizations: No    Attends Banker Meetings: Never    Marital Status: Widowed  Intimate Partner Violence: Not At Risk (07/30/2024)   Humiliation, Afraid, Rape, and Kick questionnaire    Fear of Current or Ex-Partner: No    Emotionally Abused: No    Physically Abused: No    Sexually Abused: No    Family History  Problem Relation Age of Onset   Bladder Cancer Neg Hx    Prostate cancer Neg Hx    Kidney cancer Neg Hx      Vitals:   08/02/24 0710 08/02/24 0720 08/02/24 0724 08/02/24 0730  BP: 119/67 113/66  108/66  Pulse: 83 81  82  Resp: (!) 29 (!) 22  (!) 26  Temp:   97.8 F (36.6 C)   TempSrc:   Oral   SpO2: 95% 95%  95%  Weight:      Height:        PHYSICAL EXAM General: Chronically ill-appearing elderly male, well nourished, in no acute distress. HEENT: Normocephalic and atraumatic. Neck: No JVD.  Lungs: Normal respiratory effort on 2L [place]. Clear bilaterally to auscultation. No wheezes, crackles, rhonchi.  Heart: Irregularly irregular, controlled rate. Normal S1 and S2 without gallops or murmurs.  Abdomen: Non-distended appearing.  Msk: Normal strength and tone for age. Extremities: Warm and well perfused. No clubbing, cyanosis.  No edema.  Neuro: Alert and oriented X 3. Psych: Answers questions appropriately.   Labs: Basic Metabolic Panel: Recent Labs    07/31/24 0130 08/01/24 0604 08/02/24 0457  NA 138 138 138  K 4.2 4.7 3.6  CL 100 102 103  CO2 26 26 25   GLUCOSE 125* 88 119*  BUN 29* 36* 67*  CREATININE 1.14 1.00 1.77*  CALCIUM  8.6* 8.3* 7.9*  MG 1.7  --   --    Liver Function Tests: Recent Labs    08/01/24 0604 08/02/24 0457  AST 134* 84*  ALT 143* 100*  ALKPHOS 174* 167*  BILITOT 7.7* 9.5*  PROT 6.8 6.3*  ALBUMIN 2.3* 2.1*   Recent Labs    07/30/24 1230  LIPASE >2,800*   CBC: Recent Labs    08/01/24 0604 08/02/24 0457  WBC 10.5 7.0  HGB 11.5*  9.6*  HCT 35.8* 29.0*  MCV 98.9 97.6  PLT 130* 124*   Cardiac Enzymes: Recent Labs    07/30/24 2245 07/31/24 0645 08/02/24 0457  TROPONINIHS 69* 78* 78*   BNP: Recent Labs    07/30/24 1230  BNP 404.2*   D-Dimer: No results for input(s): DDIMER in the last 72 hours. Hemoglobin A1C: No results for input(s): HGBA1C in the last 72 hours. Fasting Lipid Panel: Recent Labs    07/30/24 1942  CHOL 91  HDL 36*  LDLCALC 47  TRIG 42  CHOLHDL 2.5   Thyroid Function Tests: Recent Labs    07/30/24 1942  TSH 1.103   Anemia Panel: No results for input(s): VITAMINB12, FOLATE, FERRITIN, TIBC, IRON, RETICCTPCT in the last 72 hours.   Radiology: ECHOCARDIOGRAM COMPLETE Result Date: 08/01/2024    ECHOCARDIOGRAM REPORT   Patient Name:   David Frey Date of Exam: 08/01/2024 Medical Rec #:  [id number]        Height:       66.0 in Accession #:    [id number]       Weight:       177.0 lb Date of Birth:  02-Jul-1930        BSA:          1.899 m Patient Age:    89 years         BP:           115/66 mmHg Patient Gender: M                HR:           82 bpm. Exam Location:  [facility] Procedure: 2D Echo, Cardiac Doppler, Color Doppler and Intracardiac            Opacification Agent (Both Spectral and Color Flow Doppler were            utilized during procedure). Indications:     Congestive Heart Failure I50.9  History:         Patient has no prior history of Echocardiogram examinations.  CHF; Pacemaker.  Sonographer:     Ashley McNeely-Sloane Referring Phys:  [id number] ALEXIS HUGELMEYER Diagnosing Phys: Dwayne D Callwood MD IMPRESSIONS  1. Left ventricular ejection fraction, by estimation, is 50 to 55%. The left ventricle has low normal function. The left ventricle has no regional wall motion abnormalities. There is moderate concentric left ventricular hypertrophy. Left ventricular diastolic parameters are consistent with Grade I diastolic dysfunction (impaired relaxation).  2.  Right ventricular systolic function is normal. The right ventricular size is normal.  3. Left atrial size was moderately dilated.  4. Right atrial size was mild to moderately dilated.  5. The mitral valve is normal in structure. Mild mitral valve regurgitation.  6. The aortic valve is normal in structure. Aortic valve regurgitation is trivial. Aortic valve sclerosis/calcification is present, without any evidence of aortic stenosis. FINDINGS  Left Ventricle: Left ventricular ejection fraction, by estimation, is 50 to 55%. The left ventricle has low normal function. The left ventricle has no regional wall motion abnormalities. Definity contrast agent was given IV to delineate the left ventricular endocardial borders. Strain was performed and the global longitudinal strain is indeterminate. The left ventricular internal cavity size was normal in size. There is moderate concentric left ventricular hypertrophy. Left ventricular diastolic  parameters are consistent with Grade I diastolic dysfunction (impaired relaxation). Right Ventricle: The right ventricular size is normal. No increase in right ventricular wall thickness. Right ventricular systolic function is normal. Left Atrium: Left atrial size was moderately dilated. Right Atrium: Right atrial size was mild to moderately dilated. Pericardium: There is no evidence of pericardial effusion. Mitral Valve: The mitral valve is normal in structure. Mild mitral annular calcification. Mild mitral valve regurgitation. MV peak gradient, 4.7 mmHg. The mean mitral valve gradient is 1.0 mmHg. Tricuspid Valve: The tricuspid valve is normal in structure. Tricuspid valve regurgitation is trivial. Aortic Valve: The aortic valve is normal in structure. Aortic valve regurgitation is trivial. Aortic regurgitation PHT measures 525 msec. Aortic valve sclerosis/calcification is present, without any evidence of aortic stenosis. Aortic valve mean gradient  measures 4.0 mmHg. Aortic valve  peak gradient measures 6.7 mmHg. Aortic valve area, by VTI measures 1.93 cm. Pulmonic Valve: The pulmonic valve was normal in structure. Pulmonic valve regurgitation is not visualized. Aorta: The ascending aorta was not well visualized. IAS/Shunts: No atrial level shunt detected by color flow Doppler. Additional Comments: 3D was performed not requiring image post processing on an independent workstation and was indeterminate.  LEFT VENTRICLE PLAX 2D LVIDd:         4.50 cm     Diastology LVIDs:         2.80 cm     LV e' medial:    5.43 cm/s LV PW:         1.60 cm     LV E/e' medial:  19.0 LV IVS:        1.40 cm     LV e' lateral:   9.08 cm/s LVOT diam:     1.80 cm     LV E/e' lateral: 11.3 LV SV:         37 LV SV Index:   20 LVOT Area:     2.54 cm  LV Volumes (MOD) LV vol d, MOD A2C: 55.1 ml LV vol d, MOD A4C: 51.4 ml LV vol s, MOD A2C: 28.2 ml LV vol s, MOD A4C: 24.6 ml LV SV MOD A2C:     26.9 ml LV SV MOD A4C:  51.4 ml LV SV MOD BP:      28.5 ml RIGHT VENTRICLE RV Basal diam:  4.30 cm RV S prime:     6.09 cm/s TAPSE (M-mode): 1.0 cm LEFT ATRIUM             Index        RIGHT ATRIUM          Index LA diam:        5.00 cm 2.63 cm/m   RA Area:     6.52 cm LA Vol (A2C):   88.4 ml 46.56 ml/m  RA Volume:   9.12 ml  4.80 ml/m LA Vol (A4C):   85.6 ml 45.09 ml/m LA Biplane Vol: 87.7 ml 46.19 ml/m  AORTIC VALVE AV Area (Vmax):    1.99 cm AV Area (Vmean):   1.78 cm AV Area (VTI):     1.93 cm AV Vmax:           129.00 cm/s AV Vmean:          85.700 cm/s AV VTI:            0.194 m AV Peak Grad:      6.7 mmHg AV Mean Grad:      4.0 mmHg LVOT Vmax:         101.00 cm/s LVOT Vmean:        60.000 cm/s LVOT VTI:          0.147 m LVOT/AV VTI ratio: 0.76 AI PHT:            525 msec  AORTA Ao Root diam: 4.00 cm Ao Asc diam:  3.60 cm MITRAL VALVE MV Area (PHT): 3.99 cm     SHUNTS MV Area VTI:   1.41 cm     Systemic VTI:  0.15 m MV Peak grad:  4.7 mmHg     Systemic Diam: 1.80 cm MV Mean grad:  1.0 mmHg MV Vmax:       1.08  m/s MV Vmean:      52.5 cm/s MV Decel Time: 190 msec MV E velocity: 103.00 cm/s Cara JONETTA Lovelace MD Electronically signed by Cara JONETTA Lovelace MD Signature Date/Time: 08/01/2024/4:56:24 PM    Final    DG Chest Port 1 View Result Date: 08/01/2024 EXAM: 1 VIEW(S) XRAY OF THE CHEST 08/01/2024 09:36:07 AM COMPARISON: 07/30/2024 CLINICAL HISTORY: Hypoxia FINDINGS: LUNGS AND PLEURA: Low lung volumes. Bibasilar platelike opacities. Small pleural effusions. No pulmonary edema. No pneumothorax. HEART AND MEDIASTINUM: Cardiomegaly. Cardiac loop recorder device in place. Atherosclerotic calcifications of the aorta. BONES AND SOFT TISSUES: No acute osseous abnormality. IMPRESSION: 1. Low lung volumes with small pleural effusions and bibasilar platelike atelectasis. 2. Cardiomegaly with cardiac loop recorder device in place. Electronically signed by: Norleen Boxer MD 08/01/2024 02:14 PM EST RP Workstation: [id number]   MR ABDOMEN MRCP W WO CONTAST Result Date: 07/31/2024 EXAM: MRCP WITH AND WITHOUT IV CONTRAST 07/30/2024 10:57:39 PM TECHNIQUE: Multisequence, multiplanar magnetic resonance images of the abdomen with and without intravenous contrast. MRCP sequences were performed. 8 mL (gadobutrol (GADAVIST) 1 MMOL/ML injection 8 mL GADOBUTROL 1 MMOL/ML IV SOLN). COMPARISON: None available. CLINICAL HISTORY: Pancreatitis, hepatobiliary obstruction, c/f choledocholithiasis. FINDINGS: LIVER: There is relative hypertrophy of the caudate lobe and lateral segment of left lobe of liver. No focal liver lesion identified. GALLBLADDER AND BILIARY SYSTEM: Large stone identified within the gallbladder neck measures 2.7 cm, image 15/4. Layering gallbladder sludge is also identified. Equivocal gallbladder wall thickening measuring up to 3 mm, image  29/26. The MRCP images are significantly motion degraded. Intermediate T2 signal is noted in the common bile duct. The common bile duct measures up to 9 mm and there is intermediate T1 and T2  signal throughout the course of the common bile duct which is likely filled with gallbladder sludge. Mild central biliary dilatation identified. No signs of choledocholithiasis. SPLEEN: Normal appearance of the spleen. PANCREAS/PANCREATIC DUCT: The main pancreatic duct is upper limits of normal measuring 3 mm. Diffuse edema throughout the course of the pancreas is identified concerning for acute pancreatitis. No signs of pancreatic necrosis or pseudocyst. ADRENAL GLANDS: Normal size and morphology bilaterally. No nodule, thickening, or hemorrhage. No periadrenal stranding. KIDNEYS: Exophytic cyst of the upper pole of right kidney measures 1.9 cm; this is uniformly T2 hyperintense without internal enhancement. No follow-up imaging recommended. Left renal cortical scarring. LYMPH NODES: No adenopathy identified. VASCULATURE: The upper abdomen vascularity appears patent. Aortic atherosclerotic calcifications. PERITONEUM: Small volume of right upper quadrant, perihepatic, and pericholecystic ascites. ABDOMINAL WALL: Fat containing, midline ventral abdominal wall hernia measures 3.2 cm. BOWEL: Grossly unremarkable. No bowel obstruction. BONES: Thoracolumbar scoliosis deformity with convexity towards the left. Lumbar degenerative disc disease. No acute abnormality or worrisome osseous lesion. SOFT TISSUES: Unremarkable. MISCELLANEOUS: Cardiac enlargement. IMPRESSION: 1. Acute pancreatitis with diffuse pancreatic edema. No pancreatic necrosis or pseudocyst. 2. Large gallbladder neck stone measuring 2.7 cm with layering sludge. Equivocal gallbladder wall thickening. 3. Mild central biliary dilatation with common bile duct measuring up to 9 mm, likely filled with sludge. No choledocholithiasis identified; evaluation limited by motion artifact. 4. Small volume perihepatic and pericholecystic ascites. Electronically signed by: Waddell Calk MD 07/31/2024 05:23 AM EST RP Workstation: [id number]   MR 3D Recon At  Scanner Result Date: 07/31/2024 EXAM: MRCP WITH AND WITHOUT IV CONTRAST 07/30/2024 10:57:39 PM TECHNIQUE: Multisequence, multiplanar magnetic resonance images of the abdomen with and without intravenous contrast. MRCP sequences were performed. 8 mL (gadobutrol (GADAVIST) 1 MMOL/ML injection 8 mL GADOBUTROL 1 MMOL/ML IV SOLN). COMPARISON: None available. CLINICAL HISTORY: Pancreatitis, hepatobiliary obstruction, c/f choledocholithiasis. FINDINGS: LIVER: There is relative hypertrophy of the caudate lobe and lateral segment of left lobe of liver. No focal liver lesion identified. GALLBLADDER AND BILIARY SYSTEM: Large stone identified within the gallbladder neck measures 2.7 cm, image 15/4. Layering gallbladder sludge is also identified. Equivocal gallbladder wall thickening measuring up to 3 mm, image 29/26. The MRCP images are significantly motion degraded. Intermediate T2 signal is noted in the common bile duct. The common bile duct measures up to 9 mm and there is intermediate T1 and T2 signal throughout the course of the common bile duct which is likely filled with gallbladder sludge. Mild central biliary dilatation identified. No signs of choledocholithiasis. SPLEEN: Normal appearance of the spleen. PANCREAS/PANCREATIC DUCT: The main pancreatic duct is upper limits of normal measuring 3 mm. Diffuse edema throughout the course of the pancreas is identified concerning for acute pancreatitis. No signs of pancreatic necrosis or pseudocyst. ADRENAL GLANDS: Normal size and morphology bilaterally. No nodule, thickening, or hemorrhage. No periadrenal stranding. KIDNEYS: Exophytic cyst of the upper pole of right kidney measures 1.9 cm; this is uniformly T2 hyperintense without internal enhancement. No follow-up imaging recommended. Left renal cortical scarring. LYMPH NODES: No adenopathy identified. VASCULATURE: The upper abdomen vascularity appears patent. Aortic atherosclerotic calcifications. PERITONEUM: Small volume  of right upper quadrant, perihepatic, and pericholecystic ascites. ABDOMINAL WALL: Fat containing, midline ventral abdominal wall hernia measures 3.2 cm. BOWEL: Grossly unremarkable. No bowel obstruction. BONES: Thoracolumbar scoliosis deformity  with convexity towards the left. Lumbar degenerative disc disease. No acute abnormality or worrisome osseous lesion. SOFT TISSUES: Unremarkable. MISCELLANEOUS: Cardiac enlargement. IMPRESSION: 1. Acute pancreatitis with diffuse pancreatic edema. No pancreatic necrosis or pseudocyst. 2. Large gallbladder neck stone measuring 2.7 cm with layering sludge. Equivocal gallbladder wall thickening. 3. Mild central biliary dilatation with common bile duct measuring up to 9 mm, likely filled with sludge. No choledocholithiasis identified; evaluation limited by motion artifact. 4. Small volume perihepatic and pericholecystic ascites. Electronically signed by: Waddell Calk MD 07/31/2024 05:23 AM EST RP Workstation: [id number]   CT Angio Chest/Abd/Pel for Dissection W and/or Wo Contrast Result Date: 07/30/2024 CLINICAL DATA:  Chest pain, shortness of breath. EXAM: CT ANGIOGRAPHY CHEST, ABDOMEN AND PELVIS TECHNIQUE: Non-contrast CT of the chest was initially obtained. Multidetector CT imaging through the chest, abdomen and pelvis was performed using the standard protocol during bolus administration of intravenous contrast. Multiplanar reconstructed images and MIPs were obtained and reviewed to evaluate the vascular anatomy. RADIATION DOSE REDUCTION: This exam was performed according to the departmental dose-optimization program which includes automated exposure control, adjustment of the mA and/or kV according to patient size and/or use of iterative reconstruction technique. CONTRAST:  100mL OMNIPAQUE IOHEXOL 350 MG/ML SOLN COMPARISON:  None Available. FINDINGS: CTA CHEST FINDINGS Cardiovascular: No evidence of an aortic intramural hematoma on precontrast imaging or aortic dissection  or aneurysm on postcontrast imaging. Atherosclerotic calcification of the aorta, aortic valve and coronary arteries. Heart is enlarged. No pericardial effusion. Enlarged pulmonic trunk. Mediastinum/Nodes: No pathologically enlarged mediastinal, hilar or axillary lymph nodes. Air in the esophagus can be seen with dysmotility. Lungs/Pleura: Mild dependent atelectasis bilaterally. Pulmonary nodules measure 6 mm or less in size. No specific follow-up recommended in a patient of this age. No pleural fluid. Airway is unremarkable. Musculoskeletal: Degenerative changes in the spine. Review of the MIP images confirms the above findings. CTA ABDOMEN AND PELVIS FINDINGS VASCULAR Aorta: No aneurysm or dissection.  Atherosclerotic calcification. Celiac: Ostial calcification without significant luminal narrowing. Widely patent. SMA: Ostial calcification.  Widely patent. Renals: Ostial calcification bilaterally.  Widely patent. IMA: Widely patent. Inflow: Minimally ectatic but not aneurysmal. Atherosclerotic calcification. No dissection. Veins: Poorly evaluated due to lack of opacification. Review of the MIP images confirms the above findings. NON-VASCULAR Hepatobiliary: Liver is unremarkable. Gallstones. No biliary ductal dilatation. Pancreas: Negative. Spleen: Negative. Adrenals/Urinary Tract: Adrenal glands are unremarkable. Low-attenuation lesions in the kidneys. No specific follow-up necessary. Renal cortical scarring and thinning bilaterally. Ureters are decompressed. Small right lateral bladder diverticulum. Stomach/Bowel: Small hiatal hernia. Stomach is otherwise unremarkable. Duodenal diverticula are incidentally noted. Small bowel and colon are otherwise unremarkable. Appendix is reportedly absent. Lymphatic: No pathologically enlarged lymph nodes. Reproductive: Prostate is visualized. Other: Small bilateral inguinal hernias contain fat. Small umbilical hernia contains fat. No free fluid. Musculoskeletal: Degenerative  changes in the spine. Osteopenia. Mild levoconvex scoliosis. Review of the MIP images confirms the above findings. IMPRESSION: 1. No evidence of acute aortic syndrome or aneurysm. 2. Cholelithiasis. 3. Aortic atherosclerosis (ICD10-I70.0). Coronary artery calcification. 4. Enlarged pulmonic trunk, indicative of pulmonary arterial hypertension. Electronically Signed   By: Newell Eke M.D.   On: 07/30/2024 14:32   DG Chest Portable 1 View Result Date: 07/30/2024 CLINICAL DATA:  Substernal chest pain and shortness of breath. EXAM: PORTABLE CHEST 1 VIEW COMPARISON:  09/12/2009. FINDINGS: Trachea is midline. Heart is enlarged. Lead less pacemaker projects over the left heart. Lungs are low in volume but clear. No pleural fluid. IMPRESSION: No acute findings. Electronically  Signed   By: Newell Eke M.D.   On: 07/30/2024 14:22   CT HEAD WO CONTRAST ( ) Result Date: 07/27/2024 EXAM: CT HEAD WITHOUT CONTRAST 07/27/2024 09:18:04 AM TECHNIQUE: CT of the head was performed without the administration of intravenous contrast. Automated exposure control, iterative reconstruction, and/or weight based adjustment of the mA/kV was utilized to reduce the radiation dose to as low as reasonably achievable. COMPARISON: None available. CLINICAL HISTORY: Headache, new onset (Age >= 51y). FINDINGS: BRAIN AND VENTRICLES: No acute hemorrhage. No evidence of acute infarct. No hydrocephalus. No extra-axial collection. No mass effect or midline shift. Age-related cerebral volume loss, not unusual for age. Mild periventricular and deep cerebral white matter disease. ORBITS: No acute abnormality. Status post bilateral lens replacement. SINUSES: Complete opacification of the right maxillary sinus, anterior ethmoid air cells and frontal sinuses. SOFT TISSUES AND SKULL: No acute soft tissue abnormality. No skull fracture. VASCULATURE: Moderate to severe atheromatous calcification within the carotid siphons and vertebral arteries.  IMPRESSION: 1. No acute intracranial abnormality. 2. Complete opacification of the right maxillary sinus, anterior ethmoid air cells, and frontal sinuses. Electronically signed by: Evalene Coho MD 07/27/2024 09:25 AM EDT RP Workstation: [id number]    ECHO as above  TELEMETRY (personally reviewed): Atrial fibrillation/intermittent ventricular pacing rate 70s  EKG (personally reviewed): ventricular pacing rate 94 bpm  Data reviewed by me 08/02/2024: last 24h vitals tele labs imaging I/O ED provider note, admission H&P, GI notes  Principal Problem:   Hypertensive emergency Active Problems:   Acute pancreatitis   Hyperbilirubinemia    ASSESSMENT AND PLAN:  David Frey is a 89 y.o. male  with a past medical history of sick sinus syndrome s/p Micra pacer implant 2020, chronic atrial fibrillation, hypertension, moderate mitral insufficiency, hyperlipidemia who presented to the ED on 07/30/2024 for chest pain. Trops mildly elevated. Lipase found to be significantly elevated and MRCP demonstrated acute pancreatitis. Cardiology was consulted for further evaluation.   # Acute pancreatitis # Hypertensive emergency # Demand ischemia # Atypical chest pain # Chronic atrial fibrillation # SSS s/p Micra leadless pacemaker 2020 Patient presented with chest pain, significantly elevated blood pressure.  Found to have significantly elevated lipase suggestive of pancreatitis.  MRCP consistent with this.  Was started on IV nitroglycerin drip for BP, titrated down overnight but increased again due to complaints of chest discomfort.  Troponins are mildly elevated up to 78 with no acute ischemic changes on EKG.  Suspect chest discomfort is secondary to acute pancreatitis. Echo this admission with EF 50-55%, no WMAs, moderate concentric LVH, normal RV size and function, moderate LA dilation and mild to moderate RA dilation, mild MR. -Ok to proceed with ERCP/IR drain placement from cardiac perspective, no  further workup needed at this time.  -Amlodipine  discontinued due to borderline BP. -Will continue heparin  for [facility] in preparation for procedure - IR vs ERCP. Was scheduled for IR drain placement yesterday but this was postponed due to hypotension.  -Minimally elevated and flat troponin most consistent with demand/supply mismatch and not ACS in the setting of acute pancreatitis. No plan for further cardiac diagnostics at this time. -Pacemaker interrogated 07/31/2024 and device set to VVIR mode, pacing PRN as low rate set at 70.  This patient's plan of care was discussed and created with Dr. Florencio and he is in agreement.  Signed: Danita Bloch, PA-C  08/02/2024, 9:01 AM [facility]

## 2024-08-02 NOTE — Progress Notes (Signed)
 Per Dr Luverne, patient tentatively planned for IR procedure, image guided transhepatic cholangiogram with possible biliary drain placement, tomorrow 08-17-2024 given patient's SBP >100. Care order for heparin  gtt to be held starting at 0700 tomorrow morning and NPO at MN ordered. If patient is deemed medically unstable for procedure tomorrow, procedure will be canceled. Discussed plan with Marien Piety, MD. IR team to reassess patient tomorrow morning.    Max Romano B Deshanta Lady NP 08/02/2024 3:45 PM

## 2024-08-02 NOTE — Progress Notes (Signed)
 PROGRESS NOTE RANGER PETRICH    DOB: [birth year], 89 y.o.  FMW:[id number]    Code Status: Limited: Do not attempt resuscitation (DNR) -DNR-LIMITED -Do Not Intubate/DNI    DOA: 07/30/2024   LOS: 3  Brief hospital course  JERRIAN MELLS is a 89 y.o. male with a PMH significant for asthma, A-fib on Eliquis, BPH, hypertension, COPD not on any O2 at home, chronic systolic heart failure with an EF 45%, hyperlipidemia, hypertension, CKD, SSS s/p pacemaker placement, presents to the emergency department for evaluation of chest pain 11/03 from [facility] via EMS. EMS gave 3 sublingual nitroglycerin and 324 ASA. EMS reports pt was hypertensive at 206/123    11/03: to ED, hypoxic to 88% on room air, BP 194/117, AST 266, ALT 116, ALP 173, TBili 4.0, Lipase >2800, BNO 404, Troponin 35 --> 36. CTA C/A/P (+)cholelithiasis. MRCP (+)pancreatitis, large GB neck stone 2.7 cm w/ layering sludge w/ equivocal GB wall thickening, mild biliary duct dilatation 9 mm. To stepdown on NTG gtt, BP improved, remains on 2-4L O2 [place].  11/04: WBC 12.3, Troponin 78, AST 236, AT 179, TBIli 5.9. Holding Eliquis - last dose 00:14 11/04. GI following - not good ERCP candidate, rec IR to eval for cholecystotomy.  Cardiology following - echo pending, pacer interrogated and ok, continue on heparin  drip, increase amlodipine , wean off ntg as able.  11/05: remains in precarious condition. On new O2 requirement which worsened overnight. Increasing pulmonary edema. Labile BP. Unable to tolerate cholecystotomy. He endorses improved RUQ pain and liver enzymes are improving. He has increased delirium in acute setting 11/6: clinically improved today. IR tentatively planning cholecystotomy tomorrow morning.   Assessment & Plan  Principal Problem:   Hypertensive emergency Active Problems:   Acute pancreatitis   Hyperbilirubinemia  Acute pancreatitis w/ elevated lipase- as seen on MRCP Possible Choledocholithiasis w/ Stone already  passed spontaneously Transaminitis w/ elevated LFTs AST ALT TBIli- improving Now w/ elevated WBC and continued tachypnea = sepsis 07/31/24 GI following, per Dr Jinny - not good ERCP candidate  recs for IR to eval for cholecystotomy. Tentatively scheduled for tomorrow if BP systolics remain at least >100 continue Zosyn starting 07/31/24 AM Holding Eliquis -  last dose 00:14 11/04. Currently on heparin . Hold 4 hours prior to procedure Cardiology clearance requested prior to any procedures. Echo unremarkable.  CMP am   Severe uncontrolled HTN- had increased amlodipine  on admission and started on nitroglycerin gtt which is now weaned off. Currently experiencing hypotension. Got IVF overnight which improved - stop amlodipine . - stay in ICU for close BP monitoring  Cardiology following  Avoid opioids if possible Continue IVF   Chest pain and mildly elevated troponin  likely demand ischemia d/t pancreatitis Have reasonably excluded ACS Chronic Afib Consider d/t intermittently rapid HR, pacemaker put in MRI mode and HR has been labile since then Cardiology following --> Minimally elevated and flat troponin most consistent with demand/supply mismatch and not ACS in the setting of acute pancreatitis.   Continues on heparin  gtt. Hold home eliquis Pacemaker interrogation - ok trend troponin to peak TSH --> WNL Lipids --> no concerns  Continuous telemetry   Hypoxia, new, unclear etiology possibly related to history of asthma/COPD, demand from hypertension, possible pulm arterial HTN as seen on CT Appears to have worsening pulmonary edema on repeat chest xray - trial of IV lasix x1 O2 and nebs PRN Check respiratory path panel --> no COVID/Flu/RSV Strict I/O    History of CHF Strict intake  and output Cardiology following     History of BPH- has been able to void but some residual urine. - try to urinate sitting or standing for gravity benefit. Post-void residual bladder scan if suspecting  retaining - place foley if he has a POST void residual >450cc - increased his home tamsulosin  to 0.8mg  daily starting 11/6 - stopped his home toviaz - last got this 11/5   Goals of care Advanced care planning  Patient states he would not want to go through risky or complicated surgical procedure, he does not want CPR in the event of cardiac arrest, he does not want intubation in the event of respiratory arrest but is fine for intubation as part of anesthesia is needed for relatively straightforward procedure such as ERCP or cholecystostomy Daughter is present and is in agreement with the above to follow her dad's wishes CODE STATUS updated, DNR/DNI Palliative is consulted.   Body mass index is 28.57 kg/m.  VTE ppx: SCDs Start: 07/30/24 1726  Diet:     Diet   Diet clear liquid Room service appropriate? Yes; Fluid consistency: Thin   Consultants: Cardiology IR GI  Subjective 08/02/24    Pt reports feeling well. Abdominal pain has resolved at rest but still tender when palpating epigastric area.    Objective  Blood pressure (!) 102/58, pulse 91, temperature 99.6 F (37.6 C), temperature source Oral, resp. rate (!) 33, height 5' 6 (1.676 m), weight 80.3 kg, SpO2 97%.  Intake/Output Summary (Last 24 hours) at 08/02/2024 0803 Last data filed at 08/02/2024 0735 Gross per 24 hour  Intake 728.81 ml  Output 1350 ml  Net -621.19 ml   Filed Weights   07/30/24 1234 07/30/24 1241  Weight: 80 kg 80.3 kg    Physical Exam:  General: awake, alert, NAD HEENT: atraumatic, clear conjunctiva, anicteric sclera, MMM, hard of hearing Respiratory: normal respiratory effort. CTAB Cardiovascular: extremities well perfused, quick capillary refill, normal S1/S2, RRR, no JVD. Systolic murmur Gastrointestinal: soft, NT, ND. Endorses mild tenderness of  epigastric abdomen but no grimacing, rebound, involuntary withdrawal with palpation Nervous: A&O to self and hospital but thinks he is in [place]    Extremities: moves all equally, no edema, normal tone Skin: dry, intact, normal temperature, normal color. No rashes, lesions or ulcers on exposed skin Psychiatry: normal mood, congruent affect  Labs   I have personally reviewed the following labs and imaging studies CBC    Component Value Date/Time   WBC 7.0 08/02/2024 0457   RBC 2.97 (L) 08/02/2024 0457   HGB 9.6 (L) 08/02/2024 0457   HGB 16.7 12/05/2013 0946   HCT 29.0 (L) 08/02/2024 0457   HCT 48.1 12/05/2013 0946   PLT 124 (L) 08/02/2024 0457   PLT 121 (L) 12/05/2013 0946   MCV 97.6 08/02/2024 0457   MCV 98 12/05/2013 0946   MCH 32.3 08/02/2024 0457   MCHC 33.1 08/02/2024 0457   RDW 14.4 08/02/2024 0457   RDW 13.9 12/05/2013 0946   LYMPHSABS 1.0 03/17/2020 0514   LYMPHSABS 1.1 12/05/2013 0946   MONOABS 0.5 03/17/2020 0514   MONOABS 0.7 12/05/2013 0946   EOSABS 0.1 03/17/2020 0514   EOSABS 0.0 12/05/2013 0946   BASOSABS 0.0 03/17/2020 0514   BASOSABS 0.0 12/05/2013 0946      Latest Ref Rng & Units 08/02/2024    4:57 AM 08/01/2024    6:04 AM 07/31/2024    1:30 AM  BMP  Glucose 70 - 99 mg/dL 880  88  874   BUN  8 - 23 mg/dL 67  36  29   Creatinine 0.61 - 1.24 mg/dL 8.22  8.99  8.85   Sodium 135 - 145 mmol/L 138  138  138   Potassium 3.5 - 5.1 mmol/L 3.6  4.7  4.2   Chloride 98 - 111 mmol/L 103  102  100   CO2 22 - 32 mmol/L 25  26  26    Calcium  8.9 - 10.3 mg/dL 7.9  8.3  8.6     ECHOCARDIOGRAM COMPLETE Result Date: 08/01/2024    ECHOCARDIOGRAM REPORT   Patient Name:   EIVAN GALLINA Date of Exam: 08/01/2024 Medical Rec #:  [id number]        Height:       66.0 in Accession #:    [id number]       Weight:       177.0 lb Date of Birth:  [birth year]        BSA:          1.899 m Patient Age:    89 years         BP:           115/66 mmHg Patient Gender: M                HR:           82 bpm. Exam Location:  [facility] Procedure: 2D Echo, Cardiac Doppler, Color Doppler and Intracardiac            Opacification Agent (Both Spectral  and Color Flow Doppler were            utilized during procedure). Indications:     Congestive Heart Failure I50.9  History:         Patient has no prior history of Echocardiogram examinations.                  CHF; Pacemaker.  Sonographer:     Ashley McNeely-Sloane Referring Phys:  [id number] ALEXIS HUGELMEYER Diagnosing Phys: Dwayne D Callwood MD IMPRESSIONS  1. Left ventricular ejection fraction, by estimation, is 50 to 55%. The left ventricle has low normal function. The left ventricle has no regional wall motion abnormalities. There is moderate concentric left ventricular hypertrophy. Left ventricular diastolic parameters are consistent with Grade I diastolic dysfunction (impaired relaxation).  2. Right ventricular systolic function is normal. The right ventricular size is normal.  3. Left atrial size was moderately dilated.  4. Right atrial size was mild to moderately dilated.  5. The mitral valve is normal in structure. Mild mitral valve regurgitation.  6. The aortic valve is normal in structure. Aortic valve regurgitation is trivial. Aortic valve sclerosis/calcification is present, without any evidence of aortic stenosis. FINDINGS  Left Ventricle: Left ventricular ejection fraction, by estimation, is 50 to 55%. The left ventricle has low normal function. The left ventricle has no regional wall motion abnormalities. Definity contrast agent was given IV to delineate the left ventricular endocardial borders. Strain was performed and the global longitudinal strain is indeterminate. The left ventricular internal cavity size was normal in size. There is moderate concentric left ventricular hypertrophy. Left ventricular diastolic  parameters are consistent with Grade I diastolic dysfunction (impaired relaxation). Right Ventricle: The right ventricular size is normal. No increase in right ventricular wall thickness. Right ventricular systolic function is normal. Left Atrium: Left atrial size was moderately dilated.  Right Atrium: Right atrial size was mild to moderately dilated. Pericardium: There is no evidence of pericardial effusion. Mitral Valve:  The mitral valve is normal in structure. Mild mitral annular calcification. Mild mitral valve regurgitation. MV peak gradient, 4.7 mmHg. The mean mitral valve gradient is 1.0 mmHg. Tricuspid Valve: The tricuspid valve is normal in structure. Tricuspid valve regurgitation is trivial. Aortic Valve: The aortic valve is normal in structure. Aortic valve regurgitation is trivial. Aortic regurgitation PHT measures 525 msec. Aortic valve sclerosis/calcification is present, without any evidence of aortic stenosis. Aortic valve mean gradient  measures 4.0 mmHg. Aortic valve peak gradient measures 6.7 mmHg. Aortic valve area, by VTI measures 1.93 cm. Pulmonic Valve: The pulmonic valve was normal in structure. Pulmonic valve regurgitation is not visualized. Aorta: The ascending aorta was not well visualized. IAS/Shunts: No atrial level shunt detected by color flow Doppler. Additional Comments: 3D was performed not requiring image post processing on an independent workstation and was indeterminate.  LEFT VENTRICLE PLAX 2D LVIDd:         4.50 cm     Diastology LVIDs:         2.80 cm     LV e' medial:    5.43 cm/s LV PW:         1.60 cm     LV E/e' medial:  19.0 LV IVS:        1.40 cm     LV e' lateral:   9.08 cm/s LVOT diam:     1.80 cm     LV E/e' lateral: 11.3 LV SV:         37 LV SV Index:   20 LVOT Area:     2.54 cm  LV Volumes (MOD) LV vol d, MOD A2C: 55.1 ml LV vol d, MOD A4C: 51.4 ml LV vol s, MOD A2C: 28.2 ml LV vol s, MOD A4C: 24.6 ml LV SV MOD A2C:     26.9 ml LV SV MOD A4C:     51.4 ml LV SV MOD BP:      28.5 ml RIGHT VENTRICLE RV Basal diam:  4.30 cm RV S prime:     6.09 cm/s TAPSE (M-mode): 1.0 cm LEFT ATRIUM             Index        RIGHT ATRIUM          Index LA diam:        5.00 cm 2.63 cm/m   RA Area:     6.52 cm LA Vol (A2C):   88.4 ml 46.56 ml/m  RA Volume:   9.12 ml   4.80 ml/m LA Vol (A4C):   85.6 ml 45.09 ml/m LA Biplane Vol: 87.7 ml 46.19 ml/m  AORTIC VALVE AV Area (Vmax):    1.99 cm AV Area (Vmean):   1.78 cm AV Area (VTI):     1.93 cm AV Vmax:           129.00 cm/s AV Vmean:          85.700 cm/s AV VTI:            0.194 m AV Peak Grad:      6.7 mmHg AV Mean Grad:      4.0 mmHg LVOT Vmax:         101.00 cm/s LVOT Vmean:        60.000 cm/s LVOT VTI:          0.147 m LVOT/AV VTI ratio: 0.76 AI PHT:            525 msec  AORTA Ao Root diam:  4.00 cm Ao Asc diam:  3.60 cm MITRAL VALVE MV Area (PHT): 3.99 cm     SHUNTS MV Area VTI:   1.41 cm     Systemic VTI:  0.15 m MV Peak grad:  4.7 mmHg     Systemic Diam: 1.80 cm MV Mean grad:  1.0 mmHg MV Vmax:       1.08 m/s MV Vmean:      52.5 cm/s MV Decel Time: 190 msec MV E velocity: 103.00 cm/s Cara JONETTA Lovelace MD Electronically signed by Cara JONETTA Lovelace MD Signature Date/Time: 08/01/2024/4:56:24 PM    Final    DG Chest Port 1 View Result Date: 08/01/2024 EXAM: 1 VIEW(S) XRAY OF THE CHEST 08/01/2024 09:36:07 AM COMPARISON: 07/30/2024 CLINICAL HISTORY: Hypoxia FINDINGS: LUNGS AND PLEURA: Low lung volumes. Bibasilar platelike opacities. Small pleural effusions. No pulmonary edema. No pneumothorax. HEART AND MEDIASTINUM: Cardiomegaly. Cardiac loop recorder device in place. Atherosclerotic calcifications of the aorta. BONES AND SOFT TISSUES: No acute osseous abnormality. IMPRESSION: 1. Low lung volumes with small pleural effusions and bibasilar platelike atelectasis. 2. Cardiomegaly with cardiac loop recorder device in place. Electronically signed by: Norleen Boxer MD 08/01/2024 02:14 PM EST RP Workstation: [id number]   Disposition Plan & Communication  Patient status: Inpatient  Admitted From: assisted living Planned disposition location: TBD Anticipated discharge date: TBD pending clinical improvement   Family Communication: daughter at bedside    Author: Marien LITTIE Piety, DO [facility] 08/02/2024, 8:03 AM    Available by Epic secure chat 7AM-7PM. If 7PM-7AM, please contact night-coverage.  [facility] contact information found on [email or web address].

## 2024-08-02 NOTE — Progress Notes (Signed)
 PHARMACY - ANTICOAGULATION CONSULT NOTE  Pharmacy Consult for heparin  infusion Indication: atrial fibrillation  Allergies  Allergen Reactions   Celecoxib Rash and Other (See Comments)    Hallucination    Patient Measurements: Height: 5' 6 (167.6 cm) Weight: 80.3 kg (177 lb) IBW/kg (Calculated) : 63.8 HEPARIN  DW (KG): 79.8  Vital Signs: Temp: 97.3 F (36.3 C) (11/05 2100) Temp Source: Oral (11/05 2100) BP: 105/62 (11/06 0200) Pulse Rate: 80 (11/06 0200)  Labs: Recent Labs    07/30/24 1230 07/30/24 1451 07/30/24 1942 07/30/24 2245 07/31/24 0130 07/31/24 0645 07/31/24 2009 08/01/24 0604 08/01/24 1407 08/02/24 0049  HGB 13.9  --   --   --  11.4*  --   --  11.5*  --   --   HCT 43.1  --   --   --  34.8*  --   --  35.8*  --   --   PLT 161  --   --   --  139*  --   --  130*  --   --   APTT  --   --   --   --   --   --    < > 82* 42* >200*  LABPROT 18.6*  --   --   --   --   --   --   --   --   --   INR 1.5*  --   --   --   --   --   --   --   --   --   HEPARINUNFRC  --   --   --   --   --   --   --  >1.10*  --   --   CREATININE 1.15  --   --   --  1.14  --   --  1.00  --   --   TROPONINIHS 35*   < > 64* 69*  --  78*  --   --   --   --    < > = values in this interval not displayed.    Estimated Creatinine Clearance: 46.9 mL/min (by C-G formula based on SCr of 1 mg/dL).   Medical History: Past Medical History:  Diagnosis Date   Actinic keratosis    Arthritis, degenerative 10/21/2015   Asthma without status asthmaticus 12/13/2014   Atrial fibrillation (HCC)    Benign fibroma of prostate 12/13/2014   Benign prostatic hyperplasia with urinary obstruction 10/30/2012   Blood glucose elevated 11/28/2014   BP (high blood pressure) 12/13/2014   Chronic obstructive pulmonary disease (HCC) 12/13/2014   Chronic systolic heart failure (HCC) 07/09/2014   Overview:  Global ef 45%    Combined fat and carbohydrate induced hyperlipemia 04/01/2014   Edema leg 04/01/2014   Excessive  urination at night 10/30/2012   Gout 12/13/2014   Hypertension    Impaired renal function 12/13/2014   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, implant, and graft 05/13/2007   Overview:  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    Methicillin susceptible Staphylococcus aureus infection 02/25/2010   Overview:  Methicillin Susceptible Staphylococcus Aureus Infection    MI (mitral incompetence) 07/09/2014   Obstruction to urinary outflow 12/13/2014   Paroxysmal atrial fibrillation (HCC) 07/09/2014    Medications:  Scheduled:   brimonidine   1 drop Left Eye BID   And   timolol   1 drop Left Eye BID   Chlorhexidine Gluconate Cloth  6  each Topical Daily   feeding supplement  1 Container Oral TID BM   latanoprost   1 drop Both Eyes QHS   pantoprazole  (PROTONIX ) IV  40 mg Intravenous Daily   sodium chloride  flush  3 mL Intravenous Q12H   tamsulosin   0.8 mg Oral Daily   Infusions:   lactated ringers     piperacillin-tazobactam 3.375 g (08/01/24 2242)   PRN: bisacodyl, ibuprofen, ipratropium-albuterol , morphine injection, ondansetron  **OR** ondansetron  (ZOFRAN ) IV, mouth rinse, oxyCODONE , traZODone  Assessment: 89 y.o. male [name] assisted [facility] with a known history of asthma A-fib BPH hypertension COPD chronic systolic heart failure with an EF 45%, hyperlipidemia, hypertension, CKD presents to the emergency department for evaluation of chest pain. Last apixaban dose 07/31/24 0014.  Goal of Therapy:  anti-Xa level 0.3-0.7 units/ml aPTT 66 - 102 seconds Monitor platelets by anticoagulation protocol: Yes  11/4 2009: aPTT 109, SUPRAtherapeutic 11/5 0604: aPTT  82, therapeutic x1 11/5 1407: aPTT  42, SUBtherapeutic @ 1150 units/hr 11/6 0049: aPTT  > 200, SUPRAtherapeutic    Plan:  11/6: aPTT @ 0049 = > 200, elevated - spoke with someone in lab who confirmed sample was drawn from opposite arm from heparin  infusion so will take this as valid - will hold  heparin  drip for 1 hr and restart at 1100 units/hr - will recheck aPTT and HL 8 hrs after restart  - will use aPTT to guide therapy until aPTT and anti-Xa level correlate)  Continue to monitor H&H and platelets  Idy Rawling D [place] [facility] 08/02/2024 2:46 AM

## 2024-08-02 NOTE — Progress Notes (Signed)
 Occupational Therapy Treatment Patient Details Name: David Frey MRN: [id number] DOB: 1930/03/07 Today's Date: 08/02/2024   History of present illness Pt is a 89 y/o M admitted on 07/30/24 after presenting with c/o chest pain. Pt is being treated for acute pancreatitis, severe uncontrolled HTN.  PMH: asthma, a-fib on Eliquis, BPH, HTN, COPD not on home O2, chronic systolic HF, HLD, HTN, CKD, SSS s/p pacemaker placement   OT comments  Upon entering the room, pt supine in bed and agreeable to OT and PT intervention. Pt's daughter present during session for observation. Pt needing +2 assistance to EOB. Pt stands with RW and needs min - mod A of 2 for side steps along EOB. Pt fatigues quickly and returns to bed with +2 assistance. Call bell and all needed items within reach upon exiting the room.       If plan is discharge home, recommend the following:  A lot of help with walking and/or transfers;A lot of help with bathing/dressing/bathroom   Equipment Recommendations  Other (comment) (defer to next venue of care)       Precautions / Restrictions Precautions Precautions: Fall Recall of Precautions/Restrictions: Impaired       Mobility Bed Mobility Overal bed mobility: Needs Assistance Bed Mobility: Supine to Sit, Sit to Supine     Supine to sit: Mod assist, Min assist, +2 for physical assistance Sit to supine: Mod assist, +2 for physical assistance   General bed mobility comments: cues for technique    Transfers Overall transfer level: Needs assistance Equipment used: Rolling walker (2 wheels) Transfers: Sit to/from Stand Sit to Stand: Mod assist, Min assist, +2 physical assistance     Step pivot transfers: Min assist, +2 physical assistance, +2 safety/equipment           Balance Overall balance assessment: Needs assistance Sitting-balance support: Feet supported Sitting balance-Leahy Scale: Fair     Standing balance support: Bilateral upper extremity  supported Standing balance-Leahy Scale: Poor Standing balance comment: external support required. emphasis on upright standing posture                           ADL either performed or assessed with clinical judgement    Extremity/Trunk Assessment Upper Extremity Assessment Upper Extremity Assessment: Generalized weakness   Lower Extremity Assessment Lower Extremity Assessment: Generalized weakness        Vision Baseline Vision/History: 1 Wears glasses Patient Visual Report: No change from baseline           Communication Communication Communication: No apparent difficulties   Cognition Arousal: Lethargic Behavior During Therapy: WFL for tasks assessed/performed Cognition: Cognition impaired           Executive functioning impairment (select all impairments): Organization, Sequencing, Reasoning, Problem solving                   Following commands: Impaired Following commands impaired: Follows one step commands with increased time      Cueing   Cueing Techniques: Verbal cues, Tactile cues, Visual cues             Pertinent Vitals/ Pain       Pain Assessment Pain Assessment: No/denies pain         Frequency  Min 2X/week        Progress Toward Goals  OT Goals(current goals can now be found in the care plan section)  Progress towards OT goals: Progressing toward goals  Co-evaluation    PT/OT/SLP Co-Evaluation/Treatment: Yes Reason for Co-Treatment: Necessary to address cognition/behavior during functional activity;To address functional/ADL transfers PT goals addressed during session: Mobility/safety with mobility;Balance OT goals addressed during session: ADL's and self-care      AM-PAC OT 6 Clicks Daily Activity     Outcome Measure   Help from another person eating meals?: None Help from another person taking care of personal grooming?: A Little Help from another person toileting, which includes using toliet,  bedpan, or urinal?: A Lot Help from another person bathing (including washing, rinsing, drying)?: A Lot Help from another person to put on and taking off regular upper body clothing?: A Lot Help from another person to put on and taking off regular lower body clothing?: A Lot 6 Click Score: 15    End of Session Equipment Utilized During Treatment: Rolling walker (2 wheels)  OT Visit Diagnosis: Unsteadiness on feet (R26.81);Muscle weakness (generalized) (M62.81)   Activity Tolerance Patient tolerated treatment well   Patient Left with call bell/phone within reach;in chair;with chair alarm set   Nurse Communication Mobility status        Time: 8986-8962 OT Time Calculation (min): 24 min  Charges: OT General Charges $OT Visit: 1 Visit OT Treatments $Therapeutic Activity: 8-22 mins  Izetta Claude, MS, OTR/L , CBIS ascom [phone]  08/02/24, 3:19 PM

## 2024-08-03 DIAGNOSIS — J9601 Acute respiratory failure with hypoxia: Secondary | ICD-10-CM

## 2024-08-03 DIAGNOSIS — I161 Hypertensive emergency: Secondary | ICD-10-CM | POA: Diagnosis not present

## 2024-08-03 DIAGNOSIS — Z711 Person with feared health complaint in whom no diagnosis is made: Secondary | ICD-10-CM

## 2024-08-03 DIAGNOSIS — Z515 Encounter for palliative care: Secondary | ICD-10-CM

## 2024-08-03 DIAGNOSIS — Z7189 Other specified counseling: Secondary | ICD-10-CM

## 2024-08-03 LAB — COMPREHENSIVE METABOLIC PANEL WITH GFR
ALT: 1177 U/L — ABNORMAL HIGH (ref 0–44)
AST: 2236 U/L — ABNORMAL HIGH (ref 15–41)
Albumin: 1.6 g/dL — ABNORMAL LOW (ref 3.5–5.0)
Alkaline Phosphatase: 159 U/L — ABNORMAL HIGH (ref 38–126)
Anion gap: 13 (ref 5–15)
BUN: 79 mg/dL — ABNORMAL HIGH (ref 8–23)
CO2: 20 mmol/L — ABNORMAL LOW (ref 22–32)
Calcium: 7.4 mg/dL — ABNORMAL LOW (ref 8.9–10.3)
Chloride: 108 mmol/L (ref 98–111)
Creatinine, Ser: 2.67 mg/dL — ABNORMAL HIGH (ref 0.61–1.24)
GFR, Estimated: 22 mL/min — ABNORMAL LOW (ref >60)
Glucose, Bld: 122 mg/dL — ABNORMAL HIGH (ref 70–99)
Potassium: 4.2 mmol/L (ref 3.5–5.1)
Sodium: 141 mmol/L (ref 135–145)
Total Bilirubin: 9.9 mg/dL — ABNORMAL HIGH (ref 0.0–1.2)
Total Protein: 5 g/dL — ABNORMAL LOW (ref 6.5–8.1)

## 2024-08-03 LAB — CBC
HCT: 18.8 % — ABNORMAL LOW (ref 39.0–52.0)
Hemoglobin: 6.3 g/dL — ABNORMAL LOW (ref 13.0–17.0)
MCH: 32.3 pg (ref 26.0–34.0)
MCHC: 33.5 g/dL (ref 30.0–36.0)
MCV: 96.4 fL (ref 80.0–100.0)
Platelets: 116 K/uL — ABNORMAL LOW (ref 150–400)
RBC: 1.95 MIL/uL — ABNORMAL LOW (ref 4.22–5.81)
RDW: 14.8 % (ref 11.5–15.5)
WBC: 8.1 K/uL (ref 4.0–10.5)
nRBC: 0 % (ref 0.0–0.2)

## 2024-08-03 LAB — TROPONIN I (HIGH SENSITIVITY): Troponin I (High Sensitivity): 120 ng/L (ref <18)

## 2024-08-03 LAB — BLOOD GAS, ARTERIAL
Acid-base deficit: 6.2 mmol/L — ABNORMAL HIGH (ref 0.0–2.0)
Bicarbonate: 19.6 mmol/L — ABNORMAL LOW (ref 20.0–28.0)
O2 Content: 3 L/min
O2 Saturation: 88.2 %
Patient temperature: 37
pCO2 arterial: 39 mmHg (ref 32–48)
pH, Arterial: 7.31 — ABNORMAL LOW (ref 7.35–7.45)
pO2, Arterial: 60 mmHg — ABNORMAL LOW (ref 83–108)

## 2024-08-03 LAB — PROTIME-INR
INR: 2.1 — ABNORMAL HIGH (ref 0.8–1.2)
Prothrombin Time: 24.2 s — ABNORMAL HIGH (ref 11.4–15.2)

## 2024-08-03 LAB — LACTIC ACID, PLASMA: Lactic Acid, Venous: 4.4 mmol/L (ref 0.5–1.9)

## 2024-08-03 LAB — GLUCOSE, CAPILLARY: Glucose-Capillary: 78 mg/dL (ref 70–99)

## 2024-08-03 MED ORDER — LACTATED RINGERS IV BOLUS: 500.0000 mL | Freq: Once | INTRAVENOUS | Status: DC

## 2024-08-03 MED ORDER — ACETAMINOPHEN 650 MG RE SUPP
650.0000 mg | Freq: Four times a day (QID) | RECTAL | Status: DC | PRN
Start: 2024-08-03 — End: 2024-08-03

## 2024-08-03 MED ORDER — BIOTENE DRY MOUTH MT LIQD: 15.0000 mL | OROMUCOSAL | Status: DC | PRN

## 2024-08-03 MED ORDER — NOREPINEPHRINE 4 MG/250ML-% IV SOLN
0.0000 ug/min | INTRAVENOUS | Status: DC
  Administered 2024-08-03: 5 ug/min via INTRAVENOUS
  Filled 2024-08-03: qty 250

## 2024-08-03 MED ORDER — LORAZEPAM 1 MG PO TABS: 1.0000 mg | ORAL_TABLET | ORAL | Status: DC | PRN

## 2024-08-03 MED ORDER — LACTATED RINGERS IV BOLUS
1000.0000 mL | Freq: Once | INTRAVENOUS | Status: AC
  Administered 2024-08-03: 1000 mL via INTRAVENOUS

## 2024-08-03 MED ORDER — HALOPERIDOL LACTATE 5 MG/ML IJ SOLN: 0.5000 mg | INTRAMUSCULAR | Status: DC | PRN

## 2024-08-03 MED ORDER — HYDROMORPHONE BOLUS VIA INFUSION: 0.2500 mg | INTRAVENOUS | Status: DC | PRN

## 2024-08-03 MED ORDER — LORAZEPAM 2 MG/ML PO CONC: 1.0000 mg | ORAL | Status: DC | PRN

## 2024-08-03 MED ORDER — HALOPERIDOL LACTATE 2 MG/ML PO CONC: 0.5000 mg | ORAL | Status: DC | PRN

## 2024-08-03 MED ORDER — GLYCOPYRROLATE 0.2 MG/ML IJ SOLN: 0.2000 mg | INTRAMUSCULAR | Status: DC | PRN

## 2024-08-03 MED ORDER — ONDANSETRON HCL 4 MG/2ML IJ SOLN: 4.0000 mg | Freq: Four times a day (QID) | INTRAMUSCULAR | Status: DC | PRN

## 2024-08-03 MED ORDER — ONDANSETRON 4 MG PO TBDP: 4.0000 mg | ORAL_TABLET | Freq: Four times a day (QID) | ORAL | Status: DC | PRN

## 2024-08-03 MED ORDER — NOREPINEPHRINE 4 MG/250ML-% IV SOLN
0.0000 ug/min | INTRAVENOUS | Status: DC
Start: 2024-08-03 — End: 2024-08-03

## 2024-08-03 MED ORDER — GLYCOPYRROLATE 1 MG PO TABS: 1.0000 mg | ORAL_TABLET | ORAL | Status: DC | PRN

## 2024-08-03 MED ORDER — MORPHINE SULFATE (PF) 2 MG/ML IV SOLN
1.0000 mg | INTRAVENOUS | Status: DC | PRN
  Administered 2024-08-03: 2 mg via INTRAVENOUS
  Administered 2024-08-03 (×2): 1 mg via INTRAVENOUS
  Filled 2024-08-03 (×3): qty 1

## 2024-08-03 MED ORDER — SODIUM CHLORIDE 0.9 % IV BOLUS
500.0000 mL | Freq: Once | INTRAVENOUS | Status: AC
  Administered 2024-08-03: 500 mL via INTRAVENOUS

## 2024-08-03 MED ORDER — HYDROMORPHONE HCL-NACL 50-0.9 MG/50ML-% IV SOLN
0.5000 mg/h | INTRAVENOUS | Status: DC
  Administered 2024-08-03: 0.5 mg/h via INTRAVENOUS
  Filled 2024-08-03: qty 50

## 2024-08-03 MED ORDER — ACETAMINOPHEN 325 MG PO TABS: 650.0000 mg | ORAL_TABLET | Freq: Four times a day (QID) | ORAL | Status: DC | PRN

## 2024-08-03 MED ORDER — LORAZEPAM 2 MG/ML IJ SOLN
1.0000 mg | INTRAMUSCULAR | Status: DC | PRN
Start: 2024-08-03 — End: 2024-08-03

## 2024-08-03 MED ORDER — SODIUM CHLORIDE 0.9 % IV SOLN
250.0000 mL | INTRAVENOUS | Status: DC
  Administered 2024-08-03: 250 mL via INTRAVENOUS

## 2024-08-03 MED ORDER — POLYVINYL ALCOHOL 1.4 % OP SOLN: 1.0000 [drp] | Freq: Four times a day (QID) | OPHTHALMIC | Status: DC | PRN

## 2024-08-03 MED ORDER — HALOPERIDOL 0.5 MG PO TABS: 0.5000 mg | ORAL_TABLET | ORAL | Status: DC | PRN

## 2024-08-06 LAB — CULTURE, BLOOD (ROUTINE X 2)
Culture: NO GROWTH
Culture: NO GROWTH
Special Requests: ADEQUATE
Special Requests: ADEQUATE

## 2024-08-27 NOTE — Consult Note (Signed)
 NAME:  David Frey, MRN:  [id number], DOB:  November 14, 1929, LOS: 4 ADMISSION DATE:  07/30/2024, CONSULTATION DATE:  08/07/2024 REFERRING MD:  Dr. Monda CHIEF COMPLAINT:  AMS and shortness of breath   History of Present Illness:  David Frey is a 89 y.o. male with a past medical history significant for asthma, a-fib on eliquis, BPH, hypertension, COPD not on home O2, chronic systolic heart failure with and EF of 50-55%, hyperlipidemia, CKD, SSS s/p pacemaker placement, who presented via EMS from [name] assisted living to [facility] ED with complaints of chest pain and shortness of breath. At time of arrival the patient reported his chest pain was 10 out of 10, located in the substernal region and woke him from his sleep. He was given 3 sublingual nitroglycerin and 324 aspirin. Per EMS he was hypertensive on arrival, 206/123.   ED Course: Patient was found to be hypertensive with shortness of breath and diaphoretic on arrival. He was started on a nitroglycerin drip to manage his blood pressure.  Medications administered: Nitroglycerin gtt Duonebs Zofran  Morphine   Pertinent Diagnostics: 11/3 CT Angio Chest/Abd/Pelvis: no evidence of acute aortic syndrome/aneurysm; cholelithiasis; aortic atherosclerosis; enlarged pulmonic trunk, indicative of pulmonary artery hypertension. 11/3 MCRP: acute pancreatitis with diffuse pancreatic edema without necrosis/pseudocyst; large gallbladder neck stone measuring 2/7cm with layering sludge, gallbladder wall thickening; mild central biliary dilatation with CBD measuring up to 9mm; no choledocholithiasis; small volume perihepatic pericholeycystic ascites.  Patient admitted to the floor for further evaluation and treatment. GI and Cardiology later consulted.  Pertinent Medical History  Asthma COPD Atrial Fibrillation - on Eliquis Chronic Systolic Heart Failure - EF 50-55% Hypertension Hyperlipidemia CKD SSS s/p pacemaker placement BPH  Significant Hospital  Events: Including procedures, antibiotic start and stop dates in addition to other pertinent events   11/3: to ED, hypoxic to 88% on room air, BP 194/117, AST 266, ALT 116, ALP 173, TBili 4.0, Lipase >2800, BNO 404, Troponin 35 --> 36. CTA C/A/P (+)cholelithiasis. MRCP (+)pancreatitis, large GB neck stone 2.7 cm w/ layering sludge w/ equivocal GB wall thickening, mild biliary duct dilatation 9 mm. To stepdown on NTG gtt, BP improved, remains on 2-4L O2 [place].  11/4: WBC 12.3, Troponin 78, AST 236, AT 179, TBIli 5.9. Holding Eliquis - last dose 00:14 11/04. GI following - not good ERCP candidate, rec IR to eval for cholecystotomy.  Cardiology following - echo pending, pacer interrogated and ok, continue on heparin  drip, increase amlodipine , wean off ntg as able.  11/5: remains in precarious condition. On new O2 requirement which worsened overnight. Increasing pulmonary edema. Labile BP. Unable to tolerate cholecystotomy. He endorses improved RUQ pain and liver enzymes are improving. He has increased delirium in acute setting 11/6: clinically improved today. IR tentatively planning cholecystotomy tomorrow morning.  Aug 07, 2024: clinical deterioration today with significant hypotension and acute mental status change. Severely lethargic and increasing oxygen requirements. PCCM consulted. Started on peripheral levophed. Elevated lactic acid and ABG identified metabolic acidosis. Palliative consulted.  Interim History / Subjective:  See above listed under Significant Hospital Events.  Objective    Blood pressure (!) 70/35, pulse 73, temperature 99.5 F (37.5 C), temperature source Axillary, resp. rate (!) 27, height 5' 6 (1.676 m), weight 80.3 kg, SpO2 95%.        Intake/Output Summary (Last 24 hours) at 2024-08-07 0953 Last data filed at 2024-08-07 0759 Gross per 24 hour  Intake 745.33 ml  Output 925 ml  Net -179.67 ml   [employer]  07/30/24 1234 07/30/24 1241  Weight: 80 kg 80.3 kg     Examination: General: critically ill appearing male, lying in bed, unresponsive and jaundiced HENT: atraumatic, normocephalic, no jvd Lungs: equal breath sounds bilaterally, diminished at bases Cardiovascular: RRR, S1 S2, no m/r/g Abdomen: soft, non-distended, tender to palpation, mild guarding of RUQ, bowel sounds x 4 Extremities: dry, intact, warm, no edema, radial pulses 2+, distal pulses 1+ Neuro: obtunded, not following commands, right pupil larger than left, very minimal pupillary reflexes GU: deferred  Resolved problem list   Assessment and Plan   #Acute Metabolic Encephalopathy s/t Metabolic Acidosis iso Septic Shock #Suspected CVA #Pain Control iso Multi-Organ Failure - Control pain and agitation as able - Lorazepam and morphine prn  - Will opt out of further imaging to rule out CVA as patient is comfort care - Palliative following; appreciate input  #Circulatory Shock #Atrial Fibrillation #Hypertension ~ Resolved #Chronic Systolic Heart Failure - (EF 50-55% as of 07/30/24) #SSS s/p Pacemaker Placement #Elevated Troponin iso Demand Ischemia Hx: A-Fib on Eliquis, Hypertension, HLD, Chronic Systolic Heart Failure - Continuous cardiac monitoring - Vasopressors to maintain MAP goal >65 - Placed on Levophed but discontinued following conversation with palliative - Continue IV fluid resuscitation as able - Troponin 120  #Acute Hypoxic Respiratory Failure s/t Metabolic Acidosis iso Septic Shock #COPD #Pulmonary Edema Hx: Asthma, COPD - Supplemental O2 to maintain sats >90% as able - Robinul to assist with secretions - PRN Morphine to assist with air hunger  #AKI s/t Septic Shock #Metabolic Acidosis s/t Septic Shock #Lactic Acidosis Hx: CKD - Strict I/O's - Trend BMP and monitor renal function - Creatinine worsening at 2.67 - Electrolyte replacement therapy as indicated - Lactic 4.4  #Septic Shock s/t Severe Pancreatitis iso  Cholecystitis #Cholelithiasis #Transaminitis #Hyperbilirubinemia 11/3 MRCP: acute pancreatitis with diffuse pancreatic edema without necrosis/pseudocyst; large gallbladder neck stone measuring 2/7cm with layering sludge, gallbladder wall thickening; mild central biliary dilatation with CBD measuring up to 9mm; no choledocholithiasis; small volume perihepatic pericholeycystic ascites. - Trend WBC and monitor fever curve - Trend hepatic function - AST/ALT worsening today - Tbili 9.9 - IR originally planned to place a biliary drain but cancelled d/t HD instability - Zofran  prn for nausea  #Acute Anemia #Thrombocytopenia #Suspected DIC s/t Sepsis - Monitor CBC and coags - Hgb declined to 6.3 overnight - Will opt out of DIC work up given comfort care - DVT prophylaxis: N/A   PCCM will sign off. Thank you for the opportunity to participate in this patient's care. Please contact if we can be of further assistance.  Labs   CBC: Recent Labs  Lab 07/30/24 1230 07/31/24 0130 08/01/24 0604 08/02/24 0457  WBC 7.8 12.3* 10.5 7.0  HGB 13.9 11.4* 11.5* 9.6*  HCT 43.1 34.8* 35.8* 29.0*  MCV 99.1 97.5 98.9 97.6  PLT 161 139* 130* 124*    Basic Metabolic Panel: Recent Labs  Lab 07/30/24 1230 07/31/24 0130 08/01/24 0604 08/02/24 0457  NA 138 138 138 138  K 3.9 4.2 4.7 3.6  CL 99 100 102 103  CO2 27 26 26 25   GLUCOSE 113* 125* 88 119*  BUN 24* 29* 36* 67*  CREATININE 1.15 1.14 1.00 1.77*  CALCIUM  9.0 8.6* 8.3* 7.9*  MG  --  1.7  --   --    GFR: Estimated Creatinine Clearance: 26.5 mL/min (A) (by C-G formula based on SCr of 1.77 mg/dL (H)). Recent Labs  Lab 07/30/24 1230 07/31/24 0130 08/01/24 0604 08/01/24 2020 08/01/24 2253  08/02/24 0457  WBC 7.8 12.3* 10.5  --   --  7.0  LATICACIDVEN  --   --   --  2.5* 1.7  --     Liver Function Tests: Recent Labs  Lab 07/30/24 1230 07/31/24 0130 08/01/24 0604 08/02/24 0457  AST 266* 236* 134* 84*  ALT 116* 179* 143* 100*   ALKPHOS 173* 180* 174* 167*  BILITOT 4.0* 5.9* 7.7* 9.5*  PROT 8.5* 6.9 6.8 6.3*  ALBUMIN 3.1* 2.5* 2.3* 2.1*   Recent Labs  Lab 07/30/24 1230  LIPASE >2,800*   No results for input(s): AMMONIA in the last 168 hours.  ABG No results found for: PHART, PCO2ART, PO2ART, HCO3, TCO2, ACIDBASEDEF, O2SAT   Coagulation Profile: Recent Labs  Lab 07/30/24 1230  INR 1.5*    Cardiac Enzymes: No results for input(s): CKTOTAL, CKMB, CKMBINDEX, TROPONINI in the last 168 hours.  HbA1C: No results found for: HGBA1C  CBG: Recent Labs  Lab 07/30/24 1742 2024/08/20 0212  GLUCAP 105* 78    Review of Systems:   Unable to assess given patient is obtunded.  Past Medical History:  He,  has a past medical history of Actinic keratosis, Arthritis, degenerative (10/21/2015), Asthma without status asthmaticus (12/13/2014), Atrial fibrillation (HCC), Benign fibroma of prostate (12/13/2014), Benign prostatic hyperplasia with urinary obstruction (10/30/2012), Blood glucose elevated (11/28/2014), BP (high blood pressure) (12/13/2014), Chronic obstructive pulmonary disease (HCC) (12/13/2014), Chronic systolic heart failure (HCC) (89/86/7984), Combined fat and carbohydrate induced hyperlipemia (04/01/2014), Edema leg (04/01/2014), Excessive urination at night (10/30/2012), Gout (12/13/2014), Hypertension, Impaired renal function (12/13/2014), 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, implant, and graft (05/13/2007), Methicillin susceptible Staphylococcus aureus infection (02/25/2010), MI (mitral incompetence) (07/09/2014), Obstruction to urinary outflow (12/13/2014), and Paroxysmal atrial fibrillation (HCC) (07/09/2014).   Surgical History:   Past Surgical History:  Procedure Laterality Date   APPENDECTOMY  09/27/1941   COLONOSCOPY WITH PROPOFOL  N/A 03/15/2020   Procedure: COLONOSCOPY WITH PROPOFOL ;  Surgeon: Toledo, Ladell POUR, MD;  Location: [facility] ENDOSCOPY;  Service:  Gastroenterology;  Laterality: N/A;   ESOPHAGOGASTRODUODENOSCOPY (EGD) WITH PROPOFOL  N/A 03/15/2020   Procedure: ESOPHAGOGASTRODUODENOSCOPY (EGD) WITH PROPOFOL ;  Surgeon: Toledo, Ladell POUR, MD;  Location: [facility] ENDOSCOPY;  Service: Gastroenterology;  Laterality: N/A;   HERNIA REPAIR  09/28/1991   PACEMAKER LEADLESS INSERTION N/A 12/18/2018   Procedure: PACEMAKER LEADLESS INSERTION;  Surgeon: Ammon Blunt, MD;  Location: [facility] INVASIVE CV LAB;  Service: Cardiovascular;  Laterality: N/A;   TONSILLECTOMY  09/27/1964   TOTAL KNEE ARTHROPLASTY  09/27/2005   TRANSURETHRAL RESECTION OF PROSTATE  09/28/1983     Social History:   reports that he has never smoked. He has never used smokeless tobacco. He reports that he does not drink alcohol and does not use drugs.   Family History:  His family history is negative for Bladder Cancer, Prostate cancer, and Kidney cancer.   Allergies Allergies  Allergen Reactions   Celecoxib Rash and Other (See Comments)    Hallucination     Home Medications  Prior to Admission medications   Medication Sig Start Date End Date Taking? Authorizing Provider  amLODipine  (NORVASC ) 2.5 MG tablet Take 2.5 mg by mouth daily.   Yes [provider]  amoxicillin-clavulanate (AUGMENTIN) 875-125 MG tablet Take 1 tablet by mouth 2 (two) times daily. 07/27/24  Yes Paduchowski, Franky, MD  Apoaequorin (PREVAGEN PO) Take 1 capsule by mouth daily.   Yes [provider]  calcium  carbonate (OSCAL) 1500 (600 Ca) MG TABS tablet Take 1 tablet by mouth.  Takes 3 times a week (Monday, Wednesday, Saturday)   Yes [provider]  COMBIGAN  0.2-0.5 % ophthalmic solution Place 1 drop into the left eye 2 (two) times daily. 11/14/18  Yes [provider]  docusate sodium  (COLACE) 100 MG capsule Take 100 mg by mouth 3 (three) times a week. Tuesday, Thursday, Saturday   Yes [provider]  ELIQUIS 5 MG TABS tablet Take 5 mg by mouth 2 (two) times  daily.   Yes [provider]  fesoterodine  (TOVIAZ ) 4 MG TB24 tablet TAKE ONE TABLET BY MOUTH ONE TIME DAILY 01/23/24  Yes Stoioff, Glendia BROCKS, MD  latanoprost  (XALATAN ) 0.005 % ophthalmic solution Place 1 drop into both eyes at bedtime.    Yes [provider]  tamsulosin  (FLOMAX ) 0.4 MG CAPS capsule TAKE 1 CAPSULE BY MOUTH EVERY DAY 12/12/23  Yes Stoioff, Glendia BROCKS, MD     Critical care time: 58 Minutes     Robet Kim, PA-C [facility] PCCM Team Contact Info: [phone]

## 2024-08-27 NOTE — Progress Notes (Signed)
 This RN reached out to POA (Daughter) and Son in attempt to update about fathers status. No Answer on both parties. Will Continue to monitor.   Ezel Vallone,RN

## 2024-08-27 NOTE — TOC Progression Note (Signed)
 Transition of Care [facility]) - Progression Note    Patient Details  Name: David Frey MRN: [id number] Date of Birth: 06/24/1930  Transition of Care [facility]) CM/SW Contact  Corrie JINNY Ruts, LCSW Phone Number: 08/05/24, 2:27 PM  Clinical Narrative:    Chart reviewed. The patient is transitioning to comfort care.      Barriers to Discharge: Continued Medical Work up               Expected Discharge Plan and Services       Living arrangements for the past 2 months: Assisted Living Facility                                       Social Drivers of Health (SDOH) Interventions SDOH Screenings   Food Insecurity: No Food Insecurity (07/30/2024)  Housing: Low Risk  (07/30/2024)  Transportation Needs: No Transportation Needs (07/30/2024)  Utilities: Not At Risk (07/30/2024)  Financial Resource Strain: Low Risk  (02/07/2024)   Received from [facility]  Social Connections: Socially Isolated (07/30/2024)  Tobacco Use: Low Risk  (07/30/2024)    Readmission Risk Interventions     No data to display

## 2024-08-27 NOTE — Progress Notes (Signed)
 PHARMACY - ANTICOAGULATION CONSULT NOTE  Pharmacy Consult for heparin  infusion Indication: atrial fibrillation  Allergies  Allergen Reactions   Celecoxib Rash and Other (See Comments)    Hallucination    Patient Measurements: Height: 5' 6 (167.6 cm) Weight: 80.3 kg (177 lb) IBW/kg (Calculated) : 63.8 HEPARIN  DW (KG): 79.8  Vital Signs: Temp: 99.3 F (37.4 C) (11/06 2000) Temp Source: Axillary (11/06 2000) BP: 106/56 (11/06 2100) Pulse Rate: 84 (11/06 2200)  Labs: Recent Labs    07/31/24 0130 07/31/24 0645 07/31/24 2009 08/01/24 0604 08/01/24 1407 08/02/24 0049 08/02/24 0457 08/02/24 1242 08/02/24 2311  HGB 11.4*  --   --  11.5*  --   --  9.6*  --   --   HCT 34.8*  --   --  35.8*  --   --  29.0*  --   --   PLT 139*  --   --  130*  --   --  124*  --   --   APTT  --   --    < > 82*   < > >200*  --  117* 89*  HEPARINUNFRC  --   --   --  >1.10*  --   --   --  >1.10*  --   CREATININE 1.14  --   --  1.00  --   --  1.77*  --   --   TROPONINIHS  --  78*  --   --   --   --  78*  --   --    < > = values in this interval not displayed.    Estimated Creatinine Clearance: 26.5 mL/min (A) (by C-G formula based on SCr of 1.77 mg/dL (H)).   Medical History: Past Medical History:  Diagnosis Date   Actinic keratosis    Arthritis, degenerative 10/21/2015   Asthma without status asthmaticus 12/13/2014   Atrial fibrillation (HCC)    Benign fibroma of prostate 12/13/2014   Benign prostatic hyperplasia with urinary obstruction 10/30/2012   Blood glucose elevated 11/28/2014   BP (high blood pressure) 12/13/2014   Chronic obstructive pulmonary disease (HCC) 12/13/2014   Chronic systolic heart failure (HCC) 07/09/2014   Overview:  Global ef 45%    Combined fat and carbohydrate induced hyperlipemia 04/01/2014   Edema leg 04/01/2014   Excessive urination at night 10/30/2012   Gout 12/13/2014   Hypertension    Impaired renal function 12/13/2014   Infection and inflammatory reaction due to  internal prosthetic device, implant, and graft 05/13/2007   Overview:  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    Methicillin susceptible Staphylococcus aureus infection 02/25/2010   Overview:  Methicillin Susceptible Staphylococcus Aureus Infection    MI (mitral incompetence) 07/09/2014   Obstruction to urinary outflow 12/13/2014   Paroxysmal atrial fibrillation (HCC) 07/09/2014    Medications:  Scheduled:   brimonidine   1 drop Left Eye BID   And   timolol   1 drop Left Eye BID   Chlorhexidine Gluconate Cloth  6 each Topical Daily   feeding supplement  1 Container Oral TID BM   latanoprost   1 drop Both Eyes QHS   pantoprazole  (PROTONIX ) IV  40 mg Intravenous Daily   sodium chloride  flush  3 mL Intravenous Q12H   tamsulosin   0.8 mg Oral Daily   Infusions:   sodium chloride  50 mL/hr at 08/02/24 2000   heparin  950 Units/hr (08/02/24 1623)   piperacillin-tazobactam 3.375 g (08/02/24 2150)   PRN: bisacodyl, ipratropium-albuterol ,  morphine injection, ondansetron  **OR** ondansetron  (ZOFRAN ) IV, mouth rinse, oxyCODONE , traZODone  Assessment: 89 y.o. male David Frey with a known history of asthma A-fib BPH hypertension COPD chronic systolic heart failure with an EF 45%, hyperlipidemia, hypertension, CKD presents to the emergency department for evaluation of chest pain. Last apixaban dose 07/31/24 0014.  Goal of Therapy:  anti-Xa level 0.3-0.7 units/ml aPTT 66 - 102 seconds Monitor platelets by anticoagulation protocol: Yes  11/4 2009: aPTT 109, SUPRAtherapeutic 11/5 0604: aPTT  82, therapeutic x1 11/5 1407: aPTT  42, SUBtherapeutic @ 1150 units/hr 11/6 0049: aPTT  > 200, SUPRAtherapeutic  11/6 1242: aPTT  117, SUPRAtherapeutic @ 1100 units/hr 11/6 2311: aPTT    89, therapeutic X 1 @ 950 units/hr   Plan:  11/6:  aPTT @ 2311 = 89, therapeutic X 1 - will continue pt on current rate and recheck aPTT and HL in 8 hrs  Will use aPTT to guide therapy until aPTT and  anti-Xa level correlate)  Continue to monitor H&H and platelets Daily CBC  David Frey D [place] [facility] Aug 22, 2024 12:12 AM

## 2024-08-27 NOTE — Plan of Care (Signed)
  Problem: Education: Goal: Knowledge of the prescribed therapeutic regimen will improve Outcome: Progressing   Problem: Coping: Goal: Ability to identify and develop effective coping behavior will improve Outcome: Progressing   Problem: Clinical Measurements: Goal: Quality of life will improve Outcome: Progressing   Problem: Respiratory: Goal: Verbalizations of increased ease of respirations will increase Outcome: Progressing   Problem: Role Relationship: Goal: Family's ability to cope with current situation will improve Outcome: Progressing Goal: Ability to verbalize concerns, feelings, and thoughts to partner or family member will improve Outcome: Progressing   Problem: Pain Management: Goal: Satisfaction with pain management regimen will improve Outcome: Progressing   Problem: Education: Goal: Knowledge of the prescribed therapeutic regimen will improve Outcome: Progressing   Problem: Coping: Goal: Ability to identify and develop effective coping behavior will improve Outcome: Progressing   Problem: Clinical Measurements: Goal: Quality of life will improve Outcome: Progressing   Problem: Respiratory: Goal: Verbalizations of increased ease of respirations will increase Outcome: Progressing   Problem: Role Relationship: Goal: Family's ability to cope with current situation will improve Outcome: Progressing Goal: Ability to verbalize concerns, feelings, and thoughts to partner or family member will improve Outcome: Progressing   Problem: Pain Management: Goal: Satisfaction with pain management regimen will improve Outcome: Progressing

## 2024-08-27 NOTE — Progress Notes (Signed)
 Patient planned for possible IR procedure today pending clinical improvement. Patient noted to be hypotensive and lethargic with altered mental status. Code status changed to DNR comfort care by palliative care. IR procedure canceled. Discussed with Halina Picking, MD.  IR will sign off. Thank your for the opportunity to participate in [facility] care. Please re-consult IR if we can be of further assistance.    Linzee Depaul B Sina Sumpter NP August 14, 2024 12:10 PM

## 2024-08-27 NOTE — Plan of Care (Signed)
  Problem: Elimination: Goal: Will not experience complications related to urinary retention Outcome: Progressing   Problem: Skin Integrity: Goal: Risk for impaired skin integrity will decrease Outcome: Progressing   Problem: Education: Goal: Knowledge of General Education information will improve Description: Including pain rating scale, medication(s)/side effects and non-pharmacologic comfort measures Outcome: Not Progressing   Problem: Health Behavior/Discharge Planning: Goal: Ability to manage health-related needs will improve Outcome: Not Progressing   Problem: Clinical Measurements: Goal: Ability to maintain clinical measurements within normal limits will improve Outcome: Not Progressing   Problem: Activity: Goal: Risk for activity intolerance will decrease Outcome: Not Progressing   Problem: Nutrition: Goal: Adequate nutrition will be maintained Outcome: Not Progressing   Problem: Coping: Goal: Level of anxiety will decrease Outcome: Not Progressing

## 2024-08-27 NOTE — Progress Notes (Addendum)
 Dr. Monda at bedside; orders received for NS bolus. PT in trendelenburg position.

## 2024-08-27 NOTE — Consult Note (Signed)
 Consultation Note Date: 08/17/24   Patient Name: David Frey  DOB: Sep 20, 1930  MRN: [id number]  Age / Sex: 89 y.o., male  PCP: Lenon Layman ORN, MD Referring Physician: Monda Terral LABOR, MD  Reason for Consultation: Establishing goals of care   HPI/Brief Hospital Course: 89 y.o. male  with past medical history of asthma, A-fib on Eliquis, BPH, hypertension, COPD, heart failure with EF 50 to 55%, hyperlipidemia, CKD's, history of sick sinus syndrome status post pacemaker placement admitted from [name] on 07/30/2024 with complaints of chest pain and shortness of breath.   In ED he was found to be significantly hypertensive--nitroglycerin drip started Imaging obtained revealing acute pancreatitis with diffuse pancreatic edema without necrosis, large gallbladder neck stone with layering sludge with gallbladder thickening Significantly elevated AST, ALT, total bilirubin and lipase Deemed not a good candidate for ERCP, considering IR placement for cholecystotomy  11/5 with new O2 requirement and worsening pulmonary edema--IR placed procedure on hold, developed delirium  2024-08-17 acute deterioration with significant hypotension and altered mental status change, transferred to stepdown unit, ultimately required initiation of vasopressor support--found to have metabolic acidosis  Palliative medicine was consulted for assisting with goals of care conversations.  Subjective:  Extensive chart review has been completed prior to meeting patient including labs, vital signs, imaging, progress notes, orders, and available advanced directive documents from current and previous encounters.  Called to bedside by CCM team as Mr. Vassell acutely decompensating. According to nursing staff, daughter at bedside awaiting her brother to arrive at bedside. Nursing notified once both son and daughter at bedside. Met with family outside of room in conference  meeting room.  Introduced myself as a publishing rights manager as a member of the palliative care team. Explained palliative medicine is specialized medical care for people living with serious illness. It focuses on providing relief from the symptoms and stress of a serious illness. The goal is to improve quality of life for both the patient and the family.   Family shared a brief life review. Mr. Martis lived independently up until a few years ago, moved into Independent Living and did well there until September of this year as his functional status declined--transitioned at that time to assisted living. Married to his wife for many years prior to her passing. Three children in total, son not present at bedside is battling his own significant health issues-siblings have been in constant communication and all are on the same page regarding GOC for Mr. Morain.  Family reflects on the wonderful times they have shared throughout Mr. Becvar 89 years. Refer to him as a kind hearted man who loved life. Family shares for several years Mr. Yandell has shared that when his time comes to allow him to pass naturally and he wishes to avoid all prolonging measures and suffering. He shared with his family he was at peace with his passing and was looking forward to being reunited with his wife.  We discussed patient's current illness and what it means in the larger context of patient's on-going co-morbidities. Natural disease trajectory and expectations at EOL were discussed.   The difference between aggressive medical intervention and comfort care was discussed.  Family confirms DNR/DNI and wish to proceed with comfort measures. We discussed comfort care, Mr. Camacho would no longer receive aggressive medical interventions such as continuous vital signs, lab work, radiology testing, or medications not focused on comfort. All care would focus on how the patient is looking and feeling. This would include management  of  any symptoms that may cause discomfort, pain, shortness of breath, cough, nausea, agitation, anxiety, and/or secretions etc. Symptoms would be managed with medications and other non-pharmacological interventions such as spiritual support if requested, repositioning, music therapy, or therapeutic listening. Family verbalized understanding and appreciation. Family verbalizes understanding and wishes to proceed with comfort care.  Orders placed to reflect comfort care, medications placed to ensure comfort.  Rounded throughout the day, transitioned to continuous hydromorphone infusion as Mr. Markell required frequent doses of IV push morphine. Rounded after initiation of infusion, appears comfortable and without distress.  At bedside at Mr. Fullilove time of passing. Offered condolences to family.   I discussed importance of continued conversations with family/support persons and all members of their medical team regarding overall plan of care and treatment options ensuring decisions are in alignment with patients goals of care.  All questions/concerns addressed. Emotional support provided to patient/family/support persons. PMT will continue to follow and support patient as needed.  Objective: Primary Diagnoses: Present on Admission:  Hypertensive emergency   Physical Exam Constitutional:      General: He is not in acute distress.    Appearance: He is ill-appearing.  HENT:     Head: Normocephalic.     Mouth/Throat:     Mouth: Mucous membranes are dry.  Cardiovascular:     Rate and Rhythm: Normal rate and regular rhythm.  Pulmonary:     Effort: Respiratory distress present.  Abdominal:     General: There is distension.  Musculoskeletal:     Right lower leg: No edema.     Left lower leg: No edema.  Skin:    General: Skin is cool.     Coloration: Skin is jaundiced.     Vital Signs: BP (!) 69/49   Pulse 77   Temp 99.5 F (37.5 C) (Axillary)   Resp (!) 22   Ht 5' 6 (1.676 m)    Wt 80.3 kg   SpO2 94%   BMI 28.57 kg/m  Pain Scale: CPOT   Pain Score: Asleep  IO: Intake/output summary:  Intake/Output Summary (Last 24 hours) at 02-Sep-2024 1657 Last data filed at 2024-09-02 1444 Gross per 24 hour  Intake 3262.13 ml  Output 235 ml  Net 3027.13 ml    LBM: Last BM Date :  (PTA) Baseline Weight: Weight: 80 kg Most recent weight: Weight: 80.3 kg      Assessment and Plan  SUMMARY OF RECOMMENDATIONS   DNR/DNI/Comfort  Palliative Prophylaxis:   Bowel Regimen, Delirium Protocol and Frequent Pain Assessment  Discussed With: CCM team and nursing staff   Thank you for this consult and allowing Palliative Medicine to participate in the care of Debby FABIENE Acie. Palliative medicine will continue to follow and assist as needed.   Visit includes: Detailed review of medical records (labs, imaging, vital signs), medically appropriate exam (mental status, respiratory, cardiac, skin), discussed with treatment team, counseling and educating patient, family and staff, documenting clinical information, medication management and coordination of care.   Signed by: Waddell Lesches, DNP, AGNP-C Palliative Medicine    Please contact Palliative Medicine Team phone at [phone] for questions and concerns.  For individual provider: See Tracey

## 2024-08-27 NOTE — Progress Notes (Signed)
 Progress Note   Patient: David Frey FMW:[id number] DOB: [birth year] DOA: 07/30/2024     4 DOS: the patient was seen and examined on 2024/08/12   Brief hospital course: Hospital course / significant events:   David Frey is a 89 y.o. male [name] with a known history of asthma, A-fib on Eliquis, BPH, hypertension, COPD not on any O2 at home, chronic systolic heart failure with an EF 45%, hyperlipidemia, hypertension, CKD (question if this is documented in error), SSS s/p pacemaker placement admitted to this hospital with: Acute pancreatitis with elevated lipase as well as elevated LFTs including AST ALT and total bili. Poss choledocholelithiasis w/ stone passed spontaneously, severe uncontrolled HTN on NTG drip in stepdown but improved and then went into septic shock, mildly elevated trops, likely demand.  Cards and GI consulted. Given unable hemodynamic status and multiple comorbid conditions, GI was unable to proceed with ERCP and so as IR was able to perform cholecystotomy- pt was transferred to ICU level and then after subsequent discussions--> family decided to pursue the route of comfort care.  Assessment and plan: David Frey has been made comfort care at this time.  Patient and family have been made aware of the grim prognosis and unlikely recovery.  At this time, they have decided to continue care directed toward comfort.  Palliative care orders have been placed in chart and family has been told of all the available options.  Questions have been sought and answered.  Will discontinue all diagnostic and life-prolonging interventions and continue measurements toward pain control and distressing symptoms. End of life goals of care reviewed with family; at this point in the event of a cardiac or respiratory arrest, nature will be allowed to pursue it's course with peace, comfort and dignity; no CPR or intubation.  Discontinue all life-prolonging medications Discontinue  telemetry start on IV Dilaudid drip and titrate as needed for pain,  IV Ativan 4 hours as needed for anxiety Glycopyrrolate injection every 4 hours as needed for excessive secretions, Artificial tears as needed for dry eyes Tylenol  suppository as needed for fever Haldol sublingual 0.5 mg as needed for agitation  If patient passes away: Cause of death will be septic shock likely secondary to acute cholangitis  Transfer to regular floor  Subjective: Unable to participate with review of systems, as patient is obtunded  Physical Exam: Vitals:   2024/08/12 0600 08-12-24 0700 August 12, 2024 0730 2024-08-12 0800  BP: (!) 90/51 (!) 99/56 (!) 89/51 (!) 70/35  Pulse: 71 75 72 73  Resp: (!) 28 (!) 29 (!) 30 (!) 27  Temp:    99.5 F (37.5 C)  TempSrc:    Axillary  SpO2: 97% 99% 100% 95%  Weight:      Height:      General: Critically ill,  HEENT: Icteric sclera, dry mouth Respiratory: normal respiratory effort. CTAB Cardiovascular: normal S1/S2, RRR, no JVD. Systolic murmur Gastrointestinal: soft, NT, ND. Nervous: Unable to assess, grimaces to painful stimuli and withdraws to pain Extremities: no edema, normal tone Skin: Yellowish pigmentation all over the skin Psychiatry: Unable to assess  Labs:  Latest Reference Range & Units August 12, 2024 09:51  Sodium 135 - 145 mmol/L 141  Potassium 3.5 - 5.1 mmol/L 4.2  Chloride 98 - 111 mmol/L 108  CO2 22 - 32 mmol/L 20 (L)  Glucose 70 - 99 mg/dL 877 (H)  BUN 8 - 23 mg/dL 79 (H)  Creatinine 9.38 - 1.24 mg/dL 7.32 (H)  Calcium  8.9 - 10.3 mg/dL  7.4 (L)  Anion gap 5 - 15  13  Alkaline Phosphatase 38 - 126 U/L 159 (H)  Albumin 3.5 - 5.0 g/dL 1.6 (L)  AST 15 - 41 U/L 2,236 (H)  ALT 0 - 44 U/L 1,177 (H)  Total Protein 6.5 - 8.1 g/dL 5.0 (L)  Total Bilirubin 0.0 - 1.2 mg/dL 9.9 (H)  GFR, Estimated >60 mL/min 22 (L)  Troponin I (High Sensitivity) <18 ng/L 120 (HH)  Lactic Acid, Venous 0.5 - 1.9 mmol/L 4.4 (HH)  WBC 4.0 - 10.5 K/uL 8.1  RBC 4.22 - 5.81  MIL/uL 1.95 (L)  Hemoglobin 13.0 - 17.0 g/dL 6.3 (L)  HCT 60.9 - 47.9 % 18.8 (L)  MCV 80.0 - 100.0 fL 96.4  MCH 26.0 - 34.0 pg 32.3  MCHC 30.0 - 36.0 g/dL 66.4  RDW 88.4 - 84.4 % 14.8  Platelets 150 - 400 K/uL 116 (L)  nRBC 0.0 - 0.2 % 0.0  Prothrombin Time 11.4 - 15.2 seconds 24.2 (H)  INR 0.8 - 1.2  2.1 (H)  (HH): Data is critically high (L): Data is abnormally low (H): Data is abnormally high  Disposition: Status is: Inpatient,    Time spent: 35 minutes  Author: Terral DELENA Seashore, MD 2024-08-05 8:29 AM  For on call review [email or web address].

## 2024-08-27 NOTE — Progress Notes (Signed)
 Intensivist consulted and orders received for pressors; Norepinephrine infusion started and titrated up to BP. See flowsheet.

## 2024-08-27 NOTE — Discharge Summary (Signed)
  Physician Death/ Discharge Summary   Patient: David Frey MRN: [id number] DOB: [birth year]  Admit date:     07/30/2024  Discharge date: 2024/08/29  Discharge Physician: Terral DELENA Seashore   PCP: Lenon Layman ORN, MD    Discharge Diagnoses: #Acute Metabolic Encephalopathy s/t Metabolic Acidosis 2/2 Septic Shock #Circulatory Shock #Atrial Fibrillation #Hypertension ~ Resolved #Chronic Systolic Heart Failure - (EF 50-55% as of 07/30/24) #SSS s/p Pacemaker Placement #Elevated Troponin iso Demand Ischemia #Acute Hypoxic Respiratory Failure s/t Metabolic Acidosis is Septic shock #COPD #Pulmonary Edema #AKI s/t Septic Shock #Septic Shock s/t Severe Pancreatitis iso Cholecystitis #Cholelithiasis #Transaminitis #Hyperbilirubinemia #Acute Anemia #Thrombocytopenia #Suspected DIC s/t Sepsis  Hospital Course: David Frey is a 89 y.o. male [name] with a known history of asthma, A-fib on Eliquis, BPH, hypertension, COPD not on any O2 at home, chronic systolic heart failure with an EF 45%, hyperlipidemia, hypertension, CKD (question if this is documented in error), SSS s/p pacemaker placement admitted to this hospital with: Acute pancreatitis with elevated lipase as well as elevated LFTs including AST ALT and total bili. Poss choledocholelithiasis w/ stone passed spontaneously, severe uncontrolled HTN on NTG drip in stepdown but improved and then went into septic shock, mildly elevated trops, likely demand.  Cards and GI consulted. Given unable hemodynamic status and multiple comorbid conditions, GI was unable to proceed with ERCP and so as IR was able to perform cholecystotomy- pt was transferred to ICU level and then after subsequent discussions--> family decided to pursue the route of comfort care.   David Frey has been made comfort care at this time.  Patient and family have been made aware of the grim prognosis and unlikely recovery.  Family decided to continue care  directed toward comfort.  Palliative care orders have been placed in chart and family has been told of all the available options.  Discontinued all diagnostic and life-prolonging interventions and continue measurements toward pain control and distressing symptoms. End of life goals of care reviewed with family; Managed with IV Dilaudid drip and titrated as needed for pain,  IV Ativan 4 hours as needed for anxiety.  Subsequently pt passed away at 1546 on Aug 29, 2024.  Cause of death is cardiopulmonary arrest secondary to septic shock likely secondary to acute cholangitis   Discharge time spent: less than 30 minutes.  Signed: Terral DELENA Seashore, MD [facility] 08-29-24

## 2024-08-27 DEATH — deceased
